# Patient Record
Sex: Male | Born: 1960 | Race: White | Hispanic: No | Marital: Married | Smoking: Never smoker
Health system: Southern US, Community
[De-identification: ages and names within clinical notes are randomized; demographics above are authoritative.]

## PROBLEM LIST (undated history)

## (undated) DIAGNOSIS — E7800 Pure hypercholesterolemia, unspecified: Secondary | ICD-10-CM

## (undated) DIAGNOSIS — K759 Inflammatory liver disease, unspecified: Secondary | ICD-10-CM

## (undated) DIAGNOSIS — C159 Malignant neoplasm of esophagus, unspecified: Secondary | ICD-10-CM

## (undated) DIAGNOSIS — R768 Other specified abnormal immunological findings in serum: Secondary | ICD-10-CM

## (undated) DIAGNOSIS — D126 Benign neoplasm of colon, unspecified: Secondary | ICD-10-CM

## (undated) DIAGNOSIS — G4730 Sleep apnea, unspecified: Secondary | ICD-10-CM

## (undated) DIAGNOSIS — I10 Essential (primary) hypertension: Secondary | ICD-10-CM

## (undated) DIAGNOSIS — E119 Type 2 diabetes mellitus without complications: Secondary | ICD-10-CM

## (undated) HISTORY — DX: Type 2 diabetes mellitus without complications: E11.9

## (undated) HISTORY — DX: Malignant neoplasm of esophagus, unspecified: C15.9

## (undated) HISTORY — PX: COLONOSCOPY: SHX174

## (undated) HISTORY — PX: CHOLECYSTECTOMY: SHX55

## (undated) HISTORY — DX: Essential (primary) hypertension: I10

---

## 2008-10-14 ENCOUNTER — Ambulatory Visit: Payer: Self-pay | Admitting: Pain Medicine

## 2010-02-20 ENCOUNTER — Ambulatory Visit: Payer: Self-pay | Admitting: Internal Medicine

## 2010-04-17 ENCOUNTER — Ambulatory Visit: Payer: Self-pay | Admitting: Gastroenterology

## 2010-04-27 ENCOUNTER — Inpatient Hospital Stay: Payer: Self-pay | Admitting: Surgery

## 2010-04-30 LAB — PATHOLOGY REPORT

## 2012-09-05 ENCOUNTER — Ambulatory Visit: Payer: Self-pay | Admitting: Unknown Physician Specialty

## 2013-07-16 ENCOUNTER — Ambulatory Visit: Payer: Self-pay | Admitting: Gastroenterology

## 2013-09-30 DIAGNOSIS — B1920 Unspecified viral hepatitis C without hepatic coma: Secondary | ICD-10-CM | POA: Insufficient documentation

## 2013-09-30 DIAGNOSIS — E119 Type 2 diabetes mellitus without complications: Secondary | ICD-10-CM | POA: Insufficient documentation

## 2013-09-30 DIAGNOSIS — I10 Essential (primary) hypertension: Secondary | ICD-10-CM | POA: Insufficient documentation

## 2013-09-30 DIAGNOSIS — G4733 Obstructive sleep apnea (adult) (pediatric): Secondary | ICD-10-CM | POA: Insufficient documentation

## 2014-04-17 ENCOUNTER — Ambulatory Visit: Payer: Self-pay | Admitting: Gastroenterology

## 2014-08-30 DIAGNOSIS — Z0000 Encounter for general adult medical examination without abnormal findings: Secondary | ICD-10-CM

## 2014-08-30 DIAGNOSIS — Z7189 Other specified counseling: Secondary | ICD-10-CM | POA: Insufficient documentation

## 2014-10-24 ENCOUNTER — Telehealth: Payer: Self-pay

## 2014-10-24 DIAGNOSIS — B182 Chronic viral hepatitis C: Secondary | ICD-10-CM

## 2014-10-24 NOTE — Telephone Encounter (Signed)
Tried contacting pt to send lab order for repeat labs. Mailed copy to pt.

## 2014-10-24 NOTE — Telephone Encounter (Signed)
-----   Message from Otilio Jefferson sent at 08/26/2014  2:10 PM EDT ----- Regarding: Re: Incoming Fax Sent: 10/07/2014  Phone Encounter: 04/12/2014  Recheck pt's viral load. GF  > From: Cas Tracz > To: Samira Acero > Sent: 04/23/2014 11:40 AM > Pt notified. GF  > From: Lucilla Lame MD > To: Journi Moffa > Sent: 04/22/2014 5:47 PM > Let the patient know the viral level was zero

## 2014-11-13 LAB — HEPATIC FUNCTION PANEL
ALK PHOS: 90 IU/L (ref 39–117)
ALT: 43 IU/L (ref 0–44)
AST: 30 IU/L (ref 0–40)
Albumin: 4.5 g/dL (ref 3.5–5.5)
Bilirubin Total: 0.8 mg/dL (ref 0.0–1.2)
Bilirubin, Direct: 0.2 mg/dL (ref 0.00–0.40)
TOTAL PROTEIN: 7.3 g/dL (ref 6.0–8.5)

## 2014-11-14 ENCOUNTER — Telehealth: Payer: Self-pay

## 2014-11-14 LAB — HCV RNA QUANT RFLX ULTRA OR GENOTYP: HCV QUANT BASELINE: NOT DETECTED [IU]/mL

## 2014-11-14 NOTE — Telephone Encounter (Signed)
-----   Message from Lucilla Lame, MD sent at 11/13/2014  8:36 AM EDT ----- Let the patient know the liver tests are normal.

## 2014-11-14 NOTE — Telephone Encounter (Signed)
Pt notified. Treatment completion has been over a year. Pt released from our care. Follow up PRN with PCP.

## 2014-11-14 NOTE — Telephone Encounter (Signed)
Pt notified of results. Treatment completion has been over a year. Pt released from our care. Follow up with PCP prn.

## 2014-12-26 ENCOUNTER — Ambulatory Visit: Payer: 59 | Attending: Pain Medicine | Admitting: Pain Medicine

## 2014-12-26 ENCOUNTER — Encounter: Payer: Self-pay | Admitting: Pain Medicine

## 2014-12-26 VITALS — BP 135/78 | HR 81 | Temp 98.5°F | Resp 16 | Ht 71.0 in | Wt 350.0 lb

## 2014-12-26 DIAGNOSIS — B1920 Unspecified viral hepatitis C without hepatic coma: Secondary | ICD-10-CM | POA: Insufficient documentation

## 2014-12-26 DIAGNOSIS — M1990 Unspecified osteoarthritis, unspecified site: Secondary | ICD-10-CM | POA: Insufficient documentation

## 2014-12-26 DIAGNOSIS — M2390 Unspecified internal derangement of unspecified knee: Secondary | ICD-10-CM | POA: Insufficient documentation

## 2014-12-26 DIAGNOSIS — M25561 Pain in right knee: Secondary | ICD-10-CM | POA: Diagnosis present

## 2014-12-26 DIAGNOSIS — M2391 Unspecified internal derangement of right knee: Secondary | ICD-10-CM

## 2014-12-26 DIAGNOSIS — G8929 Other chronic pain: Secondary | ICD-10-CM | POA: Diagnosis not present

## 2014-12-26 DIAGNOSIS — E119 Type 2 diabetes mellitus without complications: Secondary | ICD-10-CM | POA: Insufficient documentation

## 2014-12-26 DIAGNOSIS — M1711 Unilateral primary osteoarthritis, right knee: Secondary | ICD-10-CM

## 2014-12-26 DIAGNOSIS — E669 Obesity, unspecified: Secondary | ICD-10-CM | POA: Insufficient documentation

## 2014-12-26 DIAGNOSIS — M1710 Unilateral primary osteoarthritis, unspecified knee: Secondary | ICD-10-CM | POA: Insufficient documentation

## 2014-12-26 MED ORDER — METHYLPREDNISOLONE ACETATE 80 MG/ML IJ SUSP
INTRAMUSCULAR | Status: AC
  Filled 2014-12-26: qty 1

## 2014-12-26 MED ORDER — METHYLPREDNISOLONE ACETATE 80 MG/ML IJ SUSP
80.0000 mg | Freq: Once | INTRAMUSCULAR | Status: AC
  Administered 2014-12-26: 15:00:00 via INTRA_ARTICULAR

## 2014-12-26 MED ORDER — LIDOCAINE HCL (PF) 1 % IJ SOLN
10.0000 mL | Freq: Once | INTRAMUSCULAR | Status: AC
  Administered 2014-12-26: 15:00:00

## 2014-12-26 MED ORDER — ROPIVACAINE HCL 2 MG/ML IJ SOLN
INTRAMUSCULAR | Status: AC
  Filled 2014-12-26: qty 10

## 2014-12-26 MED ORDER — LIDOCAINE HCL (PF) 1 % IJ SOLN
INTRAMUSCULAR | Status: AC
  Filled 2014-12-26: qty 5

## 2014-12-26 MED ORDER — ROPIVACAINE HCL 2 MG/ML IJ SOLN
9.0000 mL | Freq: Once | INTRAMUSCULAR | Status: AC
  Administered 2014-12-26: 15:00:00

## 2014-12-26 NOTE — Progress Notes (Signed)
Safety precautions to be maintained throughout the outpatient stay will include: orient to surroundings, keep bed in low position, maintain call bell within reach at all times, provide assistance with transfer out of bed and ambulation.  

## 2014-12-26 NOTE — Progress Notes (Signed)
Patient's Name: Matthew Yang MRN: [id number] DOB: Apr 16, 1960 DOS: 12/26/2014  Primary Reason(s) for Visit: Interventional Pain Management Treatment. CC: Knee Pain I did  Pre-Procedure Assessment: Matthew Yang is a 54 y.o. year old, male patient, seen today for interventional treatment. He has Type 2 diabetes mellitus ([place]); HCV (hepatitis C virus); Benign hypertension; Morbid (severe) obesity due to excess calories ([place]); Obstructive apnea; Encounter for general adult medical examination without abnormal findings; Tricompartment osteoarthritis of knee; Arthritis, senescent; Right knee pain; and Knee derangement syndrome on his problem list.. His primarily concern today is the Knee Pain Verification of the correct person, correct site (including marking of site), and correct procedure were performed and confirmed by the patient. Physical exam performed prior to the procedure revealed exquisite tenderness to palpation over the medial aspect of the knee in the area of the medial meniscus and the medial collateral ligament. The patient indicates triggering of the pain upon exercising pressure over the medial compartment during normal walking.  Today's Vitals   12/26/14 1433 12/26/14 1436 12/26/14 1500  BP: 121/69  135/78  Pulse: 82  81  Temp: 98.5 F (36.9 C)    Resp: 20  16  Height: '5\' 11"'  (1.803 m)    Weight: 350 lb (158.759 kg)    SpO2:   96%  PainSc: 8  8  0-No pain  PainLoc: Knee    Calculated BMI: Body mass index is 48.84 kg/(m^2). Allergies: He has No Known Allergies.. Primary Diagnosis: Right knee pain [M25.561]  Procedure: Type: Diagnostic Intra-Articular Knee Injection Region: Medial  Knee Region Level: Knee Joint Laterality: Right  Indications: Chronic Pain Knee Joint Pain  Consent: Secured. Under the influence of no sedatives a written informed consent was obtained, after having provided information on the risks and possible complications. To fulfill our ethical and legal  obligations, as recommended by the American Medical Association's Code of Ethics, we have provided information to the patient about our clinical impression; the nature and purpose of the treatment or procedure; the risks, benefits, and possible complications of the intervention; alternatives; the risk(s) and benefit(s) of the alternative treatment(s) or procedure(s); and the risk(s) and benefit(s) of doing nothing. The patient was provided information about the risks and possible complications associated with the procedure. These include, but are not limited to, failure to achieve desired goals, infection, bleeding, organ or nerve damage, allergic reactions, paralysis, and death. In the case of intra- or periarticular procedures these may include, but are not limited to, failure to achieve desired goals, infection, bleeding (hemarthrosis), organ or nerve damage, allergic reactions, and death. In addition, the patient was informed that Medicine is not an exact science; therefore, there is also the possibility of unforeseen risks and possible complications that may result in a catastrophic outcome. The patient indicated having understood very clearly. We have given the patient no guarantees and we have made no promises. Enough time was given to the patient to ask questions, all of which were answered to the patient's satisfaction.  Pre-Procedure Preparation: Safety Precautions: Allergies reviewed. Appropriate site, procedure, and patient were confirmed by following the Joint Commission's Universal Protocol (UP.01.01.01), in the form of a "Time Out". The patient was asked to confirm marked site and procedure, before commencing. The patient was asked about blood thinners, or active infections, both of which were denied. Patient was assessed for positional comfort and all pressure points were checked before starting procedure. Monitoring:  As per clinic protocol. Infection Control Precautions: Sterile technique  used. Standard Universal Precautions  were taken as recommended by the Department of [facility] for Disease Control and Prevention (CDC). Standard pre-surgical skin prep was conducted. Respiratory hygiene and cough etiquette was practiced. Hand hygiene observed. Safe injection practices and needle disposal techniques followed. SDV (single dose vial) medications used. Medications properly checked for expiration dates and contaminants. Personal protective equipment (PPE) used: Sterile surgical gloves.  Anesthesia, Analgesia, Anxiolysis: Type: Local Anesthesia Local Anesthetic(s): Lidocaine 1% Route: Subcutaneous IV Access: None. Sedation: Declined. Indication(s): Patient declined.  Description of Procedure Process:  Time-out: "Time-out" completed before starting procedure, as per protocol. Position: Sitting with the right knee bent 90 degrees over the fluoroscopy table, with the medial aspect facing up. Target Area: Knee Joint Approach: Medial approach. Area Prepped: Entire knee area, from the mid-thigh to the mid-shin. Prepping solution: ChloraPrep (2% chlorhexidine gluconate and 70% isopropyl alcohol) Safety Precautions: Aspiration looking for blood return was conducted prior to all injections. At no point did we inject any substances, as a needle was being advanced. No attempts were made at seeking any paresthesias. Safe injection practices and needle disposal techniques used. Medications properly checked for expiration dates. SDV (single dose vial) medications used. Description of the Procedure: Protocol guidelines were followed. The patient was placed in position over the fluoroscopy table. The target area was identified and the area prepped in the usual manner. Skin desensitized using vapocoolant spray. Skin & deeper tissues infiltrated with local anesthetic. Appropriate amount of time allowed to pass for local anesthetics to take effect. The procedure needles were then advanced to  the target area. Proper needle placement secured. Negative aspiration confirmed. Solution injected in intermittent fashion, asking for systemic symptoms every 0.5cc of injectate. The needles were then removed and the area cleansed, making sure to leave some of the prepping solution back to take advantage of its long term bactericidal properties. EBL: None Materials & Medications Used:  Needle(s) Used: 22g - 1.5" Needle(s) Medications Administered today: We administered methylPREDNISolone acetate, lidocaine (PF), ropivacaine (PF) 2 mg/ml (0.2%), ropivacaine (PF) 2 mg/ml (0.2%), lidocaine (PF), and methylPREDNISolone acetate.Please see chart orders for dosing details.  Imaging Guidance:  Type of Imaging Technique: Fluoroscopy Guidance (Non-spinal) Indication(s): Assistance in needle guidance and placement for procedures requiring needle placement in or near specific anatomical locations not easily accessible without such assistance. Exposure Time: Please see nurses notes. Contrast: None required. Fluoroscopic Guidance: I was personally present in the fluoroscopy suite, where the patient was placed in position for the procedure, over the fluoroscopy-compatible table. Fluoroscopy was manipulated, using "Tunnel Vision Technique", to obtain the best possible view of the target area, on the affected side. Parallax error was corrected before commencing the procedure. A "direction-depth-direction" technique was used to introduce the needle under continuous pulsed fluoroscopic guidance. Interpretation: Intraoperative imaging interpretation by performing Physician. Adequate needle placement confirmed. No contrast injected. Permanent hardcopy images in multiple planes scanned into the patient's record.  Antibiotics:  Type:  Antibiotics Given (last 72 hours)    None      Indication(s): No indications identified.  Post-operative Assessment:  Complications: No immediate post-treatment complications were  observed. Disposition: Return to clinic for follow-up evaluation. The patient tolerated the entire procedure well. A repeat set of vitals were taken after the procedure and the patient was kept under observation following institutional policy, for this type of procedure. The patient was discharged home, once institutional criteria were met. The patient was provided with post-procedure discharge instructions, including a section on how to identify potential problems. Should any problems  arise concerning this procedure, the patient was given instructions to immediately contact us, at any time, without hesitation. In any case, we plan to contact the patient by telephone for a follow-up status report regarding this interventional procedure. Comments:  No additional relevant information.  Primary Care Physician: Kirk Ruths., MD Location: [facility] Note by: Kathlen Brunswick. Dossie Arbour, M.D, DABA, DABAPM, DABPM, DABIPP, FIPP  Disclaimer:  Medicine is not an exact science. The only guarantee in medicine is that nothing is guaranteed. It is important to note that the decision to proceed with this intervention was based on the information collected from the patient. The Data and conclusions were drawn from the patient's questionnaire, the interview, and the physical examination. Because the information was provided in large part by the patient, it cannot be guaranteed that it has not been purposely or unconsciously manipulated. Every effort has been made to obtain as much relevant data as possible for this evaluation. It is important to note that the conclusions that lead to this procedure are derived in large part from the available data. Always take into account that the treatment will also be dependent on availability of resources and existing treatment guidelines, considered by other Pain Management Practitioners as being common knowledge and practice, at the time of the  intervention. For Medico-Legal purposes, it is also important to point out that variation in procedural techniques and pharmacological choices are the acceptable norm. The indications, contraindications, technique, and results of the above procedure should only be interpreted and judged by a Board-Certified Interventional Pain Specialist with extensive familiarity and expertise in the same exact procedure and technique. Attempts at providing opinions without similar or greater experience and expertise than that of the treating physician will be considered as inappropriate and unethical, and shall result in a formal complaint to the state medical board and applicable specialty societies. Daisyreviewed the middle ear

## 2014-12-26 NOTE — Assessment & Plan Note (Signed)
Today, 12/26/2014, we performed a diagnostic right knee injection. Physical exam would suggest medial meniscal tear. MRI ordered.

## 2014-12-26 NOTE — Patient Instructions (Signed)
Knee Injection A knee injection is a procedure to get medicine into your knee joint. Your health care provider puts a needle into the joint and injects medicine with an attached syringe. The injected medicine may relieve the pain, swelling, and stiffness of arthritis. The injected medicine may also help to lubricate and cushion your knee joint. You may need more than one injection. LET [facility] CARE PROVIDER KNOW ABOUT:  Any allergies you have.  All medicines you are taking, including vitamins, herbs, eye drops, creams, and over-the-counter medicines.  Previous problems you or members of your family have had with the use of anesthetics.  Any blood disorders you have.  Previous surgeries you have had.  Any medical conditions you may have. RISKS AND COMPLICATIONS Generally, this is a safe procedure. However, problems may occur, including:  Infection.  Bleeding.  Worsening symptoms.  Damage to the area around your knee.  Allergic reaction to any of the medicines.  Skin reactions from repeated injections. BEFORE THE PROCEDURE  Ask your health care provider about changing or stopping your regular medicines. This is especially important if you are taking diabetes medicines or blood thinners.  Plan to have someone take you home after the procedure. PROCEDURE  You will sit or lie down in a position for your knee to be treated.  The skin over your kneecap will be cleaned with a germ-killing solution (antiseptic).  You will be given a medicine that numbs the area (local anesthetic). You may feel some stinging.  After your knee becomes numb, you will have a second injection. This is the medicine. This needle is carefully placed between your kneecap and your knee. The medicine is injected into the joint space.  At the end of the procedure, the needle will be removed.  A bandage (dressing) may be placed over the injection site. The procedure may vary among health care providers  and hospitals. AFTER THE PROCEDURE  You may have to move your knee through its full range of motion. This helps to get all of the medicine into your joint space.  Your blood pressure, heart rate, breathing rate, and blood oxygen level will be monitored often until the medicines you were given have worn off.  You will be watched to make sure that you do not have a reaction to the injected medicine.   This information is not intended to replace advice given to you by your health care provider. Make sure you discuss any questions you have with your health care provider.   Document Released: 05/16/2006 Document Revised: 03/15/2014 Document Reviewed: 01/02/2014 Elsevier Interactive Patient Education 2016 [employer] Pain Management Discharge Instructions  General Discharge Instructions :  If you need to reach your doctor call: Monday-Friday 8:00 am - 4:00 pm at [phone] or toll free [phone].  After clinic hours [phone] to have operator reach doctor.  Bring all of your medication bottles to all your appointments in the pain clinic.  To cancel or reschedule your appointment with Pain Management please remember to call 24 hours in advance to avoid a fee.  Refer to the educational materials which you have been given on: General Risks, I had my Procedure. Discharge Instructions, Post Sedation.  Post Procedure Instructions:  The drugs you were given will stay in your system until tomorrow, so for the next 24 hours you should not drive, make any legal decisions or drink any alcoholic beverages.  You may eat anything you prefer, but it is better to start with liquids then  soups and crackers, and gradually work up to solid foods.  Please notify your doctor immediately if you have any unusual bleeding, trouble breathing or pain that is not related to your normal pain.  Depending on the type of procedure that was done, some parts of your body may feel week and/or numb.  This  usually clears up by tonight or the next day.  Walk with the use of an assistive device or accompanied by an adult for the 24 hours.  You may use ice on the affected area for the first 24 hours.  Put ice in a Ziploc bag and cover with a towel and place against area 15 minutes on 15 minutes off.  You may switch to heat after 24 hours.

## 2014-12-27 ENCOUNTER — Telehealth: Payer: Self-pay | Admitting: *Deleted

## 2014-12-27 NOTE — Telephone Encounter (Signed)
Left voice mail

## 2015-03-17 DIAGNOSIS — G4733 Obstructive sleep apnea (adult) (pediatric): Secondary | ICD-10-CM | POA: Diagnosis not present

## 2015-03-19 DIAGNOSIS — R739 Hyperglycemia, unspecified: Secondary | ICD-10-CM | POA: Diagnosis not present

## 2015-03-19 DIAGNOSIS — E782 Mixed hyperlipidemia: Secondary | ICD-10-CM | POA: Diagnosis not present

## 2015-03-19 DIAGNOSIS — E119 Type 2 diabetes mellitus without complications: Secondary | ICD-10-CM | POA: Diagnosis not present

## 2015-03-24 DIAGNOSIS — G4733 Obstructive sleep apnea (adult) (pediatric): Secondary | ICD-10-CM | POA: Diagnosis not present

## 2015-03-24 DIAGNOSIS — I10 Essential (primary) hypertension: Secondary | ICD-10-CM | POA: Diagnosis not present

## 2015-03-28 DIAGNOSIS — E119 Type 2 diabetes mellitus without complications: Secondary | ICD-10-CM | POA: Diagnosis not present

## 2015-03-28 DIAGNOSIS — G4733 Obstructive sleep apnea (adult) (pediatric): Secondary | ICD-10-CM | POA: Diagnosis not present

## 2015-03-28 DIAGNOSIS — I10 Essential (primary) hypertension: Secondary | ICD-10-CM | POA: Diagnosis not present

## 2015-04-24 DIAGNOSIS — I10 Essential (primary) hypertension: Secondary | ICD-10-CM | POA: Diagnosis not present

## 2015-04-24 DIAGNOSIS — G4733 Obstructive sleep apnea (adult) (pediatric): Secondary | ICD-10-CM | POA: Diagnosis not present

## 2015-05-22 DIAGNOSIS — G4733 Obstructive sleep apnea (adult) (pediatric): Secondary | ICD-10-CM | POA: Diagnosis not present

## 2015-05-22 DIAGNOSIS — I10 Essential (primary) hypertension: Secondary | ICD-10-CM | POA: Diagnosis not present

## 2015-07-22 DIAGNOSIS — E119 Type 2 diabetes mellitus without complications: Secondary | ICD-10-CM | POA: Diagnosis not present

## 2015-07-22 DIAGNOSIS — I10 Essential (primary) hypertension: Secondary | ICD-10-CM | POA: Diagnosis not present

## 2015-07-22 DIAGNOSIS — E7800 Pure hypercholesterolemia, unspecified: Secondary | ICD-10-CM | POA: Diagnosis not present

## 2015-07-25 DIAGNOSIS — I10 Essential (primary) hypertension: Secondary | ICD-10-CM | POA: Diagnosis not present

## 2015-07-25 DIAGNOSIS — E119 Type 2 diabetes mellitus without complications: Secondary | ICD-10-CM | POA: Diagnosis not present

## 2015-07-25 DIAGNOSIS — G4733 Obstructive sleep apnea (adult) (pediatric): Secondary | ICD-10-CM | POA: Diagnosis not present

## 2015-08-15 DIAGNOSIS — M1711 Unilateral primary osteoarthritis, right knee: Secondary | ICD-10-CM | POA: Diagnosis not present

## 2015-11-13 DIAGNOSIS — E119 Type 2 diabetes mellitus without complications: Secondary | ICD-10-CM | POA: Diagnosis not present

## 2015-11-13 DIAGNOSIS — Z0000 Encounter for general adult medical examination without abnormal findings: Secondary | ICD-10-CM | POA: Diagnosis not present

## 2015-11-13 DIAGNOSIS — I10 Essential (primary) hypertension: Secondary | ICD-10-CM | POA: Diagnosis not present

## 2015-11-14 DIAGNOSIS — E7800 Pure hypercholesterolemia, unspecified: Secondary | ICD-10-CM | POA: Diagnosis not present

## 2015-11-14 DIAGNOSIS — I10 Essential (primary) hypertension: Secondary | ICD-10-CM | POA: Diagnosis not present

## 2015-11-14 DIAGNOSIS — E119 Type 2 diabetes mellitus without complications: Secondary | ICD-10-CM | POA: Diagnosis not present

## 2015-12-09 DIAGNOSIS — G4733 Obstructive sleep apnea (adult) (pediatric): Secondary | ICD-10-CM | POA: Diagnosis not present

## 2016-01-02 ENCOUNTER — Other Ambulatory Visit: Payer: Self-pay | Admitting: Internal Medicine

## 2016-01-02 DIAGNOSIS — R319 Hematuria, unspecified: Secondary | ICD-10-CM | POA: Diagnosis not present

## 2016-01-02 DIAGNOSIS — Z23 Encounter for immunization: Secondary | ICD-10-CM | POA: Diagnosis not present

## 2016-01-09 ENCOUNTER — Ambulatory Visit: Admission: RE | Admit: 2016-01-09 | Payer: 59 | Source: Ambulatory Visit

## 2016-01-16 DIAGNOSIS — Z86010 Personal history of colonic polyps: Secondary | ICD-10-CM | POA: Diagnosis not present

## 2016-02-19 ENCOUNTER — Other Ambulatory Visit: Payer: Self-pay

## 2016-04-07 DIAGNOSIS — J209 Acute bronchitis, unspecified: Secondary | ICD-10-CM | POA: Diagnosis not present

## 2016-05-04 ENCOUNTER — Ambulatory Visit: Payer: 59 | Admitting: Anesthesiology

## 2016-05-04 ENCOUNTER — Encounter
Admission: RE | Disposition: A | Discharge status: Home / Self Care | Payer: Self-pay | Source: Ambulatory Visit | Attending: Gastroenterology

## 2016-05-04 ENCOUNTER — Ambulatory Visit
Admission: RE | Admit: 2016-05-04 | Discharge: 2016-05-04 | Disposition: A | Discharge status: Home / Self Care | Payer: 59 | Source: Ambulatory Visit | Attending: Gastroenterology | Admitting: Gastroenterology

## 2016-05-04 DIAGNOSIS — M1990 Unspecified osteoarthritis, unspecified site: Secondary | ICD-10-CM | POA: Insufficient documentation

## 2016-05-04 DIAGNOSIS — D125 Benign neoplasm of sigmoid colon: Secondary | ICD-10-CM | POA: Insufficient documentation

## 2016-05-04 DIAGNOSIS — Z7984 Long term (current) use of oral hypoglycemic drugs: Secondary | ICD-10-CM | POA: Insufficient documentation

## 2016-05-04 DIAGNOSIS — G4730 Sleep apnea, unspecified: Secondary | ICD-10-CM | POA: Insufficient documentation

## 2016-05-04 DIAGNOSIS — Z1211 Encounter for screening for malignant neoplasm of colon: Secondary | ICD-10-CM | POA: Insufficient documentation

## 2016-05-04 DIAGNOSIS — K529 Noninfective gastroenteritis and colitis, unspecified: Secondary | ICD-10-CM | POA: Insufficient documentation

## 2016-05-04 DIAGNOSIS — D121 Benign neoplasm of appendix: Secondary | ICD-10-CM | POA: Diagnosis not present

## 2016-05-04 DIAGNOSIS — K5289 Other specified noninfective gastroenteritis and colitis: Secondary | ICD-10-CM | POA: Diagnosis not present

## 2016-05-04 DIAGNOSIS — E119 Type 2 diabetes mellitus without complications: Secondary | ICD-10-CM | POA: Insufficient documentation

## 2016-05-04 DIAGNOSIS — Z86010 Personal history of colonic polyps: Secondary | ICD-10-CM | POA: Diagnosis not present

## 2016-05-04 DIAGNOSIS — I10 Essential (primary) hypertension: Secondary | ICD-10-CM | POA: Diagnosis not present

## 2016-05-04 DIAGNOSIS — D120 Benign neoplasm of cecum: Secondary | ICD-10-CM | POA: Diagnosis not present

## 2016-05-04 DIAGNOSIS — Z79899 Other long term (current) drug therapy: Secondary | ICD-10-CM | POA: Insufficient documentation

## 2016-05-04 DIAGNOSIS — E7800 Pure hypercholesterolemia, unspecified: Secondary | ICD-10-CM | POA: Diagnosis not present

## 2016-05-04 HISTORY — DX: Sleep apnea, unspecified: G47.30

## 2016-05-04 HISTORY — DX: Inflammatory liver disease, unspecified: K75.9

## 2016-05-04 HISTORY — PX: COLONOSCOPY WITH PROPOFOL: SHX5780

## 2016-05-04 HISTORY — DX: Pure hypercholesterolemia, unspecified: E78.00

## 2016-05-04 HISTORY — DX: Morbid (severe) obesity due to excess calories: E66.01

## 2016-05-04 LAB — GLUCOSE, CAPILLARY: GLUCOSE-CAPILLARY: 130 mg/dL — AB (ref 65–99)

## 2016-05-04 SURGERY — COLONOSCOPY WITH PROPOFOL
Anesthesia: General

## 2016-05-04 MED ORDER — PROPOFOL 500 MG/50ML IV EMUL
INTRAVENOUS | Status: DC | PRN
  Administered 2016-05-04: 160 ug/kg/min via INTRAVENOUS

## 2016-05-04 MED ORDER — LIDOCAINE 2% (20 MG/ML) 5 ML SYRINGE
INTRAMUSCULAR | Status: DC | PRN
  Administered 2016-05-04: 40 mg via INTRAVENOUS

## 2016-05-04 MED ORDER — MIDAZOLAM HCL 5 MG/5ML IJ SOLN
INTRAMUSCULAR | Status: DC | PRN
  Administered 2016-05-04: 1 mg via INTRAVENOUS

## 2016-05-04 MED ORDER — PROPOFOL 10 MG/ML IV BOLUS
INTRAVENOUS | Status: AC
  Filled 2016-05-04: qty 20

## 2016-05-04 MED ORDER — SODIUM CHLORIDE 0.9 % IJ SOLN
INTRAMUSCULAR | Status: AC
  Filled 2016-05-04: qty 10

## 2016-05-04 MED ORDER — SODIUM CHLORIDE 0.9 % IV SOLN
INTRAVENOUS | Status: DC
  Administered 2016-05-04: 07:00:00 via INTRAVENOUS

## 2016-05-04 MED ORDER — MIDAZOLAM HCL 2 MG/2ML IJ SOLN
INTRAMUSCULAR | Status: AC
  Filled 2016-05-04: qty 2

## 2016-05-04 MED ORDER — FENTANYL CITRATE (PF) 100 MCG/2ML IJ SOLN
INTRAMUSCULAR | Status: DC | PRN
  Administered 2016-05-04: 50 ug via INTRAVENOUS

## 2016-05-04 MED ORDER — LIDOCAINE HCL (PF) 2 % IJ SOLN
INTRAMUSCULAR | Status: AC
  Filled 2016-05-04: qty 2

## 2016-05-04 MED ORDER — EPHEDRINE SULFATE 50 MG/ML IJ SOLN
INTRAMUSCULAR | Status: DC | PRN
  Administered 2016-05-04: 10 mg via INTRAVENOUS

## 2016-05-04 MED ORDER — PROPOFOL 10 MG/ML IV BOLUS
INTRAVENOUS | Status: DC | PRN
  Administered 2016-05-04: 100 mg via INTRAVENOUS

## 2016-05-04 MED ORDER — FENTANYL CITRATE (PF) 100 MCG/2ML IJ SOLN
INTRAMUSCULAR | Status: AC
  Filled 2016-05-04: qty 2

## 2016-05-04 MED ORDER — PHENYLEPHRINE HCL 10 MG/ML IJ SOLN
INTRAMUSCULAR | Status: DC | PRN
  Administered 2016-05-04 (×2): 100 ug via INTRAVENOUS

## 2016-05-04 MED ORDER — SODIUM CHLORIDE 0.9 % IV SOLN: INTRAVENOUS | Status: DC

## 2016-05-04 MED ORDER — PROPOFOL 500 MG/50ML IV EMUL
INTRAVENOUS | Status: AC
  Filled 2016-05-04: qty 50

## 2016-05-04 NOTE — Anesthesia Post-op Follow-up Note (Cosign Needed)
Anesthesia QCDR form completed.        

## 2016-05-04 NOTE — H&P (Signed)
Outpatient short stay form Pre-procedure 05/04/2016 7:25 AM Lollie Sails MD  Primary Physician: Dr. Frazier Richards  Reason for visit:  Colonoscopy  History of present illness:  Patient is a 56 year old male presenting today as above. Is a personal history of adenomatous colon polyps. He tolerated his prep well. He takes no aspirin or blood thinning agents.    Current Facility-Administered Medications:  .  0.9 %  sodium chloride infusion, , Intravenous, Continuous, Lollie Sails, MD, Last Rate: 20 mL/hr at 05/04/16 0723 .  0.9 %  sodium chloride infusion, , Intravenous, Continuous, Lollie Sails, MD  Prescriptions Prior to Admission  Medication Sig Dispense Refill Last Dose  . atorvastatin (LIPITOR) 20 MG tablet Take 20 mg by mouth daily.   05/04/2016 at Unknown time  . lisinopril (PRINIVIL,ZESTRIL) 20 MG tablet Take 20 mg by mouth daily.   05/04/2016 at Unknown time  . Omega-3 Fatty Acids (FISH OIL) 1000 MG CAPS Take by mouth.   05/02/2016  . glipiZIDE (GLUCOTROL XL) 2.5 MG 24 hr tablet Take 2.5 mg by mouth daily with breakfast.   05/02/2016  . Multiple Vitamin (MULTI-VITAMINS) TABS Take by mouth.   05/02/2016     Allergies  Allergen Reactions  . Metformin And Related Nausea Only     Past Medical History:  Diagnosis Date  . Diabetes mellitus without complication ([place])   . Hepatitis    HEPATITIS C  . Hypercholesteremia   . Hypertension   . Morbid obesity ([place])   . Sleep apnea     Review of systems:      Physical Exam    Heart and lungs: Regular rate and rhythm without rub or gallop, lungs are bilaterally clear.    HEENT: Normocephalic atraumatic eyes are anicteric    Other:     Pertinant exam for procedure: Soft nontender nondistended bowel sounds positive and normoactive.    Planned proceedures: Colonoscopy and indicated procedures. Outpatient short stay form Pre-procedure 05/04/2016 7:26 AM  I have discussed the risks benefits and complications  of procedures to include not limited to bleeding, infection, perforation and the risk of sedation and the patient wishes to proceed.

## 2016-05-04 NOTE — Op Note (Signed)
[facility] Gastroenterology Patient Name: Matthew Yang Procedure Date: 05/04/2016 7:31 AM MRN: [id number] Account #: [id number] Date of Birth: May 06, 1960 Admit Type: Outpatient Age: 56 Room: [facility] ENDO ROOM 3 Gender: Male Note Status: Finalized Procedure:            Colonoscopy Indications:          Personal history of colonic polyps Providers:            Lollie Sails, MD Referring MD:         Ocie Cornfield. Ouida Sills MD, MD (Referring MD) Medicines:            Monitored Anesthesia Care Complications:        No immediate complications. Procedure:            Pre-Anesthesia Assessment:                       - ASA Grade Assessment: III - A patient with severe                        systemic disease.                       After obtaining informed consent, the colonoscope was                        passed under direct vision. Throughout the procedure,                        the patient's blood pressure, pulse, and oxygen                        saturations were monitored continuously. The                        Colonoscope was introduced through the anus and                        advanced to the the cecum, identified by appendiceal                        orifice and ileocecal valve. The patient tolerated the                        procedure well. The quality of the bowel preparation                        was good. Findings:      A 2 mm polyp was found in the distal sigmoid colon. The polyp was       sessile. The polyp was removed with a cold biopsy forceps. Resection and       retrieval were complete.      A less than 1 mm polyp was found near the appendiceal orifice. The polyp       was sessile. The polyp was removed with a cold biopsy forceps. Resection       and retrieval were complete.      A localized area of granular mucosa was found at the ileocecal valve.       Biopsies were taken with a cold forceps for histology.      The digital rectal exam was  normal.  The retroflexed view of the distal rectum and anal verge was normal and       showed no anal or rectal abnormalities. Impression:           - One 2 mm polyp in the distal sigmoid colon, removed                        with a cold biopsy forceps. Resected and retrieved.                       - One less than 1 mm polyp near the appendiceal                        orifice, removed with a cold biopsy forceps. Resected                        and retrieved.                       - Granularity at the ileocecal valve. Biopsied.                       - The distal rectum and anal verge are normal on                        retroflexion view. Recommendation:       - Discharge patient to home.                       - Await pathology results.                       - Telephone GI clinic for pathology results in 1 week. Procedure Code(s):    --- Professional ---                       [phone], Colonoscopy, flexible; with biopsy, single or                        multiple Diagnosis Code(s):    --- Professional ---                       D12.5, Benign neoplasm of sigmoid colon                       D12.0, Benign neoplasm of cecum                       K63.89, Other specified diseases of intestine                       Z86.010, Personal history of colonic polyps CPT copyright 2016 American Medical Association. All rights reserved. The codes documented in this report are preliminary and upon coder review may  be revised to meet current compliance requirements. Lollie Sails, MD 05/04/2016 8:08:16 AM This report has been signed electronically. Number of Addenda: 0 Note Initiated On: 05/04/2016 7:31 AM Scope Withdrawal Time: 0 hours 12 minutes 1 second  Total Procedure Duration: 0 hours 21 minutes 50 seconds       [facility]

## 2016-05-04 NOTE — Anesthesia Postprocedure Evaluation (Signed)
Anesthesia Post Note  Patient: Matthew Yang  Procedure(s) Performed: Procedure(s) (LRB): COLONOSCOPY WITH PROPOFOL (N/A)  Patient location during evaluation: PACU Anesthesia Type: General Level of consciousness: awake and alert Pain management: pain level controlled Vital Signs Assessment: post-procedure vital signs reviewed and stable Respiratory status: spontaneous breathing, nonlabored ventilation, respiratory function stable and patient connected to nasal cannula oxygen Cardiovascular status: blood pressure returned to baseline and stable Postop Assessment: no signs of nausea or vomiting Anesthetic complications: no     Last Vitals:  Vitals:   05/04/16 0811 05/04/16 0817  BP: (!) 90/55 113/64  Pulse: 82 65  Resp: 16 (!) 23  Temp: 37.3 C     Last Pain:  Vitals:   05/04/16 0811  TempSrc: Tympanic                 Molli Barrows

## 2016-05-04 NOTE — Transfer of Care (Signed)
Immediate Anesthesia Transfer of Care Note  Patient: Matthew Yang  Procedure(s) Performed: Procedure(s): COLONOSCOPY WITH PROPOFOL (N/A)  Patient Location: PACU and Endoscopy Unit  Anesthesia Type:General  Level of Consciousness: awake, oriented and patient cooperative  Airway & Oxygen Therapy: Patient Spontanous Breathing and Patient connected to nasal cannula oxygen  Post-op Assessment: Report given to RN and Post -op Vital signs reviewed and stable  Post vital signs: Reviewed and stable  Last Vitals:  Vitals:   05/04/16 0707 05/04/16 0807  BP: (!) 156/71 (!) 89/55  Pulse: 73 73  Resp: 16 14  Temp: (!) 35.9 C 37.3 C    Last Pain:  Vitals:   05/04/16 0807  TempSrc: Tympanic         Complications: No apparent anesthesia complications

## 2016-05-04 NOTE — Anesthesia Preprocedure Evaluation (Signed)
Anesthesia Evaluation  Patient identified by MRN, date of birth, ID band Patient awake    Reviewed: Allergy & Precautions, H&P , NPO status , Patient's Chart, lab work & pertinent test results, reviewed documented beta blocker date and time   Airway Mallampati: III   Neck ROM: full    Dental  (+) Poor Dentition, Teeth Intact   Pulmonary neg pulmonary ROS, sleep apnea ,    Pulmonary exam normal        Cardiovascular hypertension, negative cardio ROS Normal cardiovascular exam Rhythm:regular Rate:Normal     Neuro/Psych negative neurological ROS  negative psych ROS   GI/Hepatic negative GI ROS, Neg liver ROS, (+) Hepatitis -  Endo/Other  negative endocrine ROSdiabetes, Well Controlled, Type 2, Oral Hypoglycemic Agents  Renal/GU negative Renal ROS  negative genitourinary   Musculoskeletal  (+) Arthritis ,   Abdominal   Peds  Hematology negative hematology ROS (+)   Anesthesia Other Findings Past Medical History: No date: Diabetes mellitus without complication (HCC) No date: Hepatitis     Comment: HEPATITIS C No date: Hypercholesteremia No date: Hypertension No date: Morbid obesity ([place]) No date: Sleep apnea Past Surgical History: No date: CHOLECYSTECTOMY No date: COLONOSCOPY BMI    Body Mass Index:  48.82 kg/m     Reproductive/Obstetrics negative OB ROS                             Anesthesia Physical Anesthesia Plan  ASA: III  Anesthesia Plan: General   Post-op Pain Management:    Induction:   Airway Management Planned:   Additional Equipment:   Intra-op Plan:   Post-operative Plan:   Informed Consent: I have reviewed the patients History and Physical, chart, labs and discussed the procedure including the risks, benefits and alternatives for the proposed anesthesia with the patient or authorized representative who has indicated his/her understanding and acceptance.    Dental Advisory Given  Plan Discussed with: CRNA  Anesthesia Plan Comments:         Anesthesia Quick Evaluation

## 2016-05-05 ENCOUNTER — Encounter: Payer: Self-pay | Admitting: Gastroenterology

## 2016-05-05 LAB — SURGICAL PATHOLOGY

## 2016-05-06 DIAGNOSIS — G4733 Obstructive sleep apnea (adult) (pediatric): Secondary | ICD-10-CM | POA: Diagnosis not present

## 2016-05-06 DIAGNOSIS — E119 Type 2 diabetes mellitus without complications: Secondary | ICD-10-CM | POA: Diagnosis not present

## 2016-05-06 DIAGNOSIS — I10 Essential (primary) hypertension: Secondary | ICD-10-CM | POA: Diagnosis not present

## 2016-10-21 DIAGNOSIS — L03011 Cellulitis of right finger: Secondary | ICD-10-CM | POA: Diagnosis not present

## 2016-10-21 DIAGNOSIS — H6121 Impacted cerumen, right ear: Secondary | ICD-10-CM | POA: Diagnosis not present

## 2016-11-03 DIAGNOSIS — E119 Type 2 diabetes mellitus without complications: Secondary | ICD-10-CM | POA: Diagnosis not present

## 2016-11-03 DIAGNOSIS — I10 Essential (primary) hypertension: Secondary | ICD-10-CM | POA: Diagnosis not present

## 2016-11-03 DIAGNOSIS — E7800 Pure hypercholesterolemia, unspecified: Secondary | ICD-10-CM | POA: Diagnosis not present

## 2016-11-10 DIAGNOSIS — I10 Essential (primary) hypertension: Secondary | ICD-10-CM | POA: Diagnosis not present

## 2016-11-10 DIAGNOSIS — Z0000 Encounter for general adult medical examination without abnormal findings: Secondary | ICD-10-CM | POA: Diagnosis not present

## 2016-11-10 DIAGNOSIS — E119 Type 2 diabetes mellitus without complications: Secondary | ICD-10-CM | POA: Diagnosis not present

## 2018-07-20 ENCOUNTER — Other Ambulatory Visit: Payer: Self-pay | Admitting: Gastroenterology

## 2018-07-20 DIAGNOSIS — R1310 Dysphagia, unspecified: Secondary | ICD-10-CM

## 2018-07-27 ENCOUNTER — Ambulatory Visit
Admission: RE | Admit: 2018-07-27 | Discharge: 2018-07-27 | Disposition: A | Discharge status: Home / Self Care | Payer: No Typology Code available for payment source | Source: Ambulatory Visit | Attending: Gastroenterology | Admitting: Gastroenterology

## 2018-07-27 ENCOUNTER — Other Ambulatory Visit: Payer: Self-pay

## 2018-07-27 DIAGNOSIS — R1310 Dysphagia, unspecified: Secondary | ICD-10-CM | POA: Insufficient documentation

## 2018-08-01 ENCOUNTER — Other Ambulatory Visit: Payer: Self-pay

## 2018-08-01 ENCOUNTER — Other Ambulatory Visit
Admission: RE | Admit: 2018-08-01 | Discharge: 2018-08-01 | Disposition: A | Discharge status: Home / Self Care | Payer: No Typology Code available for payment source | Source: Ambulatory Visit | Attending: Gastroenterology | Admitting: Gastroenterology

## 2018-08-01 DIAGNOSIS — Z1159 Encounter for screening for other viral diseases: Secondary | ICD-10-CM | POA: Diagnosis present

## 2018-08-02 LAB — NOVEL CORONAVIRUS, NAA (HOSP ORDER, SEND-OUT TO REF LAB; TAT 18-24 HRS): SARS-CoV-2, NAA: NOT DETECTED

## 2018-08-03 ENCOUNTER — Encounter: Payer: Self-pay | Admitting: *Deleted

## 2018-08-04 ENCOUNTER — Encounter: Payer: Self-pay | Admitting: Anesthesiology

## 2018-08-04 ENCOUNTER — Other Ambulatory Visit: Payer: Self-pay | Admitting: Gastroenterology

## 2018-08-04 ENCOUNTER — Other Ambulatory Visit: Payer: Self-pay

## 2018-08-04 ENCOUNTER — Ambulatory Visit: Payer: No Typology Code available for payment source | Admitting: Anesthesiology

## 2018-08-04 ENCOUNTER — Ambulatory Visit
Admission: RE | Admit: 2018-08-04 | Discharge: 2018-08-04 | Disposition: A | Discharge status: Home / Self Care | Payer: No Typology Code available for payment source | Attending: Gastroenterology | Admitting: Gastroenterology

## 2018-08-04 ENCOUNTER — Encounter
Admission: RE | Disposition: A | Discharge status: Home / Self Care | Payer: Self-pay | Source: Home / Self Care | Attending: Gastroenterology

## 2018-08-04 ENCOUNTER — Other Ambulatory Visit (HOSPITAL_COMMUNITY): Payer: Self-pay | Admitting: Gastroenterology

## 2018-08-04 DIAGNOSIS — E7800 Pure hypercholesterolemia, unspecified: Secondary | ICD-10-CM | POA: Insufficient documentation

## 2018-08-04 DIAGNOSIS — Z86010 Personal history of colonic polyps: Secondary | ICD-10-CM | POA: Insufficient documentation

## 2018-08-04 DIAGNOSIS — E119 Type 2 diabetes mellitus without complications: Secondary | ICD-10-CM | POA: Insufficient documentation

## 2018-08-04 DIAGNOSIS — B1920 Unspecified viral hepatitis C without hepatic coma: Secondary | ICD-10-CM | POA: Diagnosis not present

## 2018-08-04 DIAGNOSIS — Z888 Allergy status to other drugs, medicaments and biological substances status: Secondary | ICD-10-CM | POA: Diagnosis not present

## 2018-08-04 DIAGNOSIS — Z79899 Other long term (current) drug therapy: Secondary | ICD-10-CM | POA: Insufficient documentation

## 2018-08-04 DIAGNOSIS — C155 Malignant neoplasm of lower third of esophagus: Secondary | ICD-10-CM | POA: Diagnosis not present

## 2018-08-04 DIAGNOSIS — M1990 Unspecified osteoarthritis, unspecified site: Secondary | ICD-10-CM | POA: Diagnosis not present

## 2018-08-04 DIAGNOSIS — Z7984 Long term (current) use of oral hypoglycemic drugs: Secondary | ICD-10-CM | POA: Diagnosis not present

## 2018-08-04 DIAGNOSIS — R1310 Dysphagia, unspecified: Secondary | ICD-10-CM | POA: Insufficient documentation

## 2018-08-04 DIAGNOSIS — G4730 Sleep apnea, unspecified: Secondary | ICD-10-CM | POA: Insufficient documentation

## 2018-08-04 DIAGNOSIS — K2970 Gastritis, unspecified, without bleeding: Secondary | ICD-10-CM | POA: Diagnosis not present

## 2018-08-04 DIAGNOSIS — Z6841 Body Mass Index (BMI) 40.0 and over, adult: Secondary | ICD-10-CM | POA: Diagnosis not present

## 2018-08-04 DIAGNOSIS — I10 Essential (primary) hypertension: Secondary | ICD-10-CM | POA: Diagnosis not present

## 2018-08-04 DIAGNOSIS — C159 Malignant neoplasm of esophagus, unspecified: Secondary | ICD-10-CM

## 2018-08-04 HISTORY — DX: Other specified abnormal immunological findings in serum: R76.8

## 2018-08-04 HISTORY — DX: Benign neoplasm of colon, unspecified: D12.6

## 2018-08-04 HISTORY — PX: ESOPHAGOGASTRODUODENOSCOPY (EGD) WITH PROPOFOL: SHX5813

## 2018-08-04 LAB — GLUCOSE, CAPILLARY: Glucose-Capillary: 119 mg/dL — ABNORMAL HIGH (ref 70–99)

## 2018-08-04 SURGERY — ESOPHAGOGASTRODUODENOSCOPY (EGD) WITH PROPOFOL
Anesthesia: General

## 2018-08-04 MED ORDER — SODIUM CHLORIDE 0.9 % IV SOLN
INTRAVENOUS | Status: DC
  Administered 2018-08-04: 1000 mL via INTRAVENOUS

## 2018-08-04 MED ORDER — LIDOCAINE HCL (CARDIAC) PF 100 MG/5ML IV SOSY
PREFILLED_SYRINGE | INTRAVENOUS | Status: DC | PRN
  Administered 2018-08-04: 100 mg via INTRATRACHEAL

## 2018-08-04 MED ORDER — SODIUM CHLORIDE 0.9 % IV SOLN
INTRAVENOUS | Status: DC
  Administered 2018-08-04: 11:00:00 via INTRAVENOUS

## 2018-08-04 MED ORDER — PROPOFOL 10 MG/ML IV BOLUS
INTRAVENOUS | Status: DC | PRN
  Administered 2018-08-04: 100 mg via INTRAVENOUS
  Administered 2018-08-04 (×2): 50 mg via INTRAVENOUS
  Administered 2018-08-04: 100 mg via INTRAVENOUS
  Administered 2018-08-04 (×2): 50 mg via INTRAVENOUS

## 2018-08-04 MED ORDER — GLYCOPYRROLATE 0.2 MG/ML IJ SOLN
INTRAMUSCULAR | Status: DC | PRN
  Administered 2018-08-04: 0.2 mg via INTRAVENOUS

## 2018-08-04 NOTE — H&P (Signed)
Outpatient short stay form Pre-procedure 08/04/2018 10:47 AM Lollie Sails MD  Primary Physician: Frazier Richards, MD  Reason for visit: EGD  History of present illness: Patient is a 58-year-old male presenting today with complaint of dysphagia.  The symptoms been going on for about the past 6 weeks or so he denies any previous problems with dysphagia.  He has been on a proton pump inhibitor for about a week to 2 weeks.  Denies any problems with heartburn.  He does have poor dentition in the back with loss of lower molars.  Does regurgitate foods.  He denies use of any aspirin or blood thinning agent however he does take tumeric.  He also takes Coricidin every night product is apparently non-aspirin.    Current Facility-Administered Medications:  .  0.9 %  sodium chloride infusion, , Intravenous, Continuous, Lollie Sails, MD, Last Rate: 20 mL/hr at 08/04/18 0949, 1,000 mL at 08/04/18 0949 .  0.9 %  sodium chloride infusion, , Intravenous, Continuous, Lollie Sails, MD  Facility-Administered Medications Ordered in Other Encounters:  .  glycopyrrolate (ROBINUL) injection, , , Anesthesia Intra-op, Fletcher-Harrison, Tawana, CRNA, 0.2 mg at 08/04/18 1022  Medications Prior to Admission  Medication Sig Dispense Refill Last Dose  . atorvastatin (LIPITOR) 20 MG tablet Take 20 mg by mouth daily.   08/03/2018 at Unknown time  . lisinopril (PRINIVIL,ZESTRIL) 20 MG tablet Take 20 mg by mouth daily.   08/03/2018 at Unknown time  . Multiple Vitamin (MULTI-VITAMINS) TABS Take by mouth.   08/03/2018 at Unknown time  . Omega-3 Fatty Acids (FISH OIL) 1000 MG CAPS Take by mouth.   08/03/2018 at Unknown time  . pantoprazole (PROTONIX) 40 MG tablet Take 40 mg by mouth daily.   08/03/2018 at Unknown time  . TURMERIC PO Take by mouth daily.   Past Week at Unknown time  . glipiZIDE (GLUCOTROL XL) 2.5 MG 24 hr tablet Take 2.5 mg by mouth daily with breakfast.   Not Taking at Unknown time      Allergies  Allergen Reactions  . Metformin And Related Nausea Only     Past Medical History:  Diagnosis Date  . Diabetes mellitus without complication ([place])   . Hepatitis    HEPATITIS C  . Hypercholesteremia   . Hypertension   . Morbid obesity ([place])   . Positive hepatitis C antibody test   . Sleep apnea   . Tubular adenoma of colon     Review of systems:      Physical Exam    Heart and lungs: The rate and rhythm without rub or gallop, lungs are bilaterally clear.    HEENT: Normocephalic atraumatic eyes are anicteric    Other:    Pertinant exam for procedure: Left nontender nondistended bowel sounds positive normoactive, obese.    Planned proceedures: EGD and indicated procedures. I have discussed the risks benefits and complications of procedures to include not limited to bleeding, infection, perforation and the risk of sedation and the patient wishes to proceed.    Lollie Sails, MD Gastroenterology 08/04/2018  10:47 AM

## 2018-08-04 NOTE — Anesthesia Preprocedure Evaluation (Signed)
Anesthesia Evaluation  Patient identified by MRN, date of birth, ID band Patient awake    Reviewed: Allergy & Precautions, NPO status , Patient's Chart, lab work & pertinent test results, reviewed documented beta blocker date and time   Airway Mallampati: III  TM Distance: >3 FB     Dental  (+) Chipped   Pulmonary sleep apnea ,           Cardiovascular hypertension, Pt. on medications      Neuro/Psych    GI/Hepatic (+) Hepatitis -  Endo/Other  diabetes, Type 2Morbid obesity  Renal/GU      Musculoskeletal  (+) Arthritis ,   Abdominal   Peds  Hematology   Anesthesia Other Findings Last EKG ok.  Reproductive/Obstetrics                             Anesthesia Physical Anesthesia Plan  ASA: III  Anesthesia Plan: General   Post-op Pain Management:    Induction: Intravenous  PONV Risk Score and Plan:   Airway Management Planned:   Additional Equipment:   Intra-op Plan:   Post-operative Plan:   Informed Consent: I have reviewed the patients History and Physical, chart, labs and discussed the procedure including the risks, benefits and alternatives for the proposed anesthesia with the patient or authorized representative who has indicated his/her understanding and acceptance.       Plan Discussed with: CRNA  Anesthesia Plan Comments:         Anesthesia Quick Evaluation

## 2018-08-04 NOTE — Transfer of Care (Signed)
Immediate Anesthesia Transfer of Care Note  Patient: Matthew Yang  Procedure(s) Performed: ESOPHAGOGASTRODUODENOSCOPY (EGD) WITH PROPOFOL (N/A )  Patient Location: Endoscopy Unit  Anesthesia Type:General  Level of Consciousness: awake, oriented and patient cooperative  Airway & Oxygen Therapy: Patient Spontanous Breathing and Patient connected to face mask oxygen  Post-op Assessment: Report given to RN and Post -op Vital signs reviewed and stable  Post vital signs: Reviewed and stable  Last Vitals:  Vitals Value Taken Time  BP 112/74 08/04/2018 11:16 AM  Temp 36.1 C 08/04/2018 11:10 AM  Pulse 75 08/04/2018 11:16 AM  Resp 20 08/04/2018 11:16 AM  SpO2 99 % 08/04/2018 11:16 AM    Last Pain:  Vitals:   08/04/18 1110  TempSrc: Tympanic  PainSc:          Complications: No apparent anesthesia complications

## 2018-08-04 NOTE — Anesthesia Post-op Follow-up Note (Signed)
Anesthesia QCDR form completed.        

## 2018-08-04 NOTE — Op Note (Addendum)
[facility] Gastroenterology Patient Name: Matthew Yang Procedure Date: 08/04/2018 10:22 AM MRN: [id number] Account #: [id number] Date of Birth: 10/30/1960 Admit Type: Outpatient Age: 58 Room: [facility] ENDO ROOM 3 Gender: Male Note Status: Finalized Procedure:            Upper GI endoscopy Indications:          Dysphagia Providers:            Lollie Sails, MD Referring MD:         Ocie Cornfield. Ouida Sills MD, MD (Referring MD) Medicines:            Monitored Anesthesia Care Complications:        No immediate complications. Procedure:            Pre-Anesthesia Assessment:                       - ASA Grade Assessment: III - A patient with severe                        systemic disease.                       After obtaining informed consent, the endoscope was                        passed under direct vision. Throughout the procedure,                        the patient's blood pressure, pulse, and oxygen                        saturations were monitored continuously. The Endoscope                        was introduced through the mouth, and advanced to the                        third part of duodenum. The upper GI endoscopy was                        accomplished without difficulty. The patient tolerated                        the procedure well. Findings:      A large, fungating and submucosal mass with friable and no stigmata of       recent bleeding was found in the lower third of the esophagus, 40 cm       from the incisors. The mass was partially obstructing and       circumferential. Biopsies were taken with a cold forceps for histology.       The mass extends into the cardia and separate biopsies were taken from       the region of the GE junction and the cardia. The overall length of the       mass is about 8-9 cm.      Patchy minimal inflammation characterized by congestion (edema) was       found in the gastric body.      The mass is also noted on  retroflexion evaluation of the cardia.       Biopsies of the cardia are  taken in retroflec position.      The examined duodenum was normal. Impression:           - Partially obstructing, rule out malignancy,                        esophageal tumor was found in the lower third of the                        esophagus. Biopsied.                       - Gastritis.                       - Normal examined duodenum. Recommendation:       - Discharge patient to home.                       - Use Protonix (pantoprazole) 40 mg PO daily.                       - Mechanical soft diet daily, Small bites, crush foods.                       - Perform a CT scan (computed tomography) of chest with                        contrast, abdomen with contrast and pelvis with                        contrast at appointment to be scheduled.                       - Await pathology results. Procedure Code(s):    --- Professional ---                       [phone], Esophagogastroduodenoscopy, flexible, transoral;                        with biopsy, single or multiple Diagnosis Code(s):    --- Professional ---                       D49.0, Neoplasm of unspecified behavior of digestive                        system                       K29.70, Gastritis, unspecified, without bleeding                       R13.10, Dysphagia, unspecified CPT copyright 2019 American Medical Association. All rights reserved. The codes documented in this report are preliminary and upon coder review may  be revised to meet current compliance requirements. Lollie Sails, MD 08/04/2018 11:19:15 AM This report has been signed electronically. Number of Addenda: 0 Note Initiated On: 08/04/2018 10:22 AM      [facility]

## 2018-08-04 NOTE — Anesthesia Postprocedure Evaluation (Signed)
Anesthesia Post Note  Patient: Matthew Yang  Procedure(s) Performed: ESOPHAGOGASTRODUODENOSCOPY (EGD) WITH PROPOFOL (N/A )  Patient location during evaluation: Endoscopy Anesthesia Type: General Level of consciousness: awake and alert Pain management: pain level controlled Vital Signs Assessment: post-procedure vital signs reviewed and stable Respiratory status: spontaneous breathing, nonlabored ventilation, respiratory function stable and patient connected to nasal cannula oxygen Cardiovascular status: blood pressure returned to baseline and stable Postop Assessment: no apparent nausea or vomiting Anesthetic complications: no     Last Vitals:  Vitals:   08/04/18 1120 08/04/18 1130  BP: 115/83 (!) 165/96  Pulse: 70   Resp: 20   Temp:    SpO2: 99%     Last Pain:  Vitals:   08/04/18 1110  TempSrc: Tympanic  PainSc:                  Marcille Barman S

## 2018-08-07 ENCOUNTER — Encounter: Payer: Self-pay | Admitting: Gastroenterology

## 2018-08-07 ENCOUNTER — Other Ambulatory Visit: Payer: Self-pay | Admitting: Pathology

## 2018-08-07 LAB — SURGICAL PATHOLOGY

## 2018-08-07 NOTE — Progress Notes (Signed)
Navigator chart review

## 2018-08-09 ENCOUNTER — Ambulatory Visit
Admission: RE | Admit: 2018-08-09 | Discharge: 2018-08-09 | Disposition: A | Discharge status: Home / Self Care | Payer: No Typology Code available for payment source | Source: Ambulatory Visit | Attending: Gastroenterology | Admitting: Gastroenterology

## 2018-08-09 ENCOUNTER — Other Ambulatory Visit: Payer: Self-pay

## 2018-08-09 DIAGNOSIS — C159 Malignant neoplasm of esophagus, unspecified: Secondary | ICD-10-CM | POA: Diagnosis not present

## 2018-08-09 MED ORDER — IOHEXOL 300 MG/ML  SOLN
125.0000 mL | Freq: Once | INTRAMUSCULAR | Status: AC | PRN
  Administered 2018-08-09: 125 mL via INTRAVENOUS

## 2018-08-10 ENCOUNTER — Other Ambulatory Visit: Payer: No Typology Code available for payment source

## 2018-08-11 NOTE — Progress Notes (Signed)
Tumor Board Documentation  Matthew Yang was presented by Verlon Au, RN at our Tumor Board on 08/10/2018, which included representatives from medical oncology, radiation oncology, surgical, radiology, pathology, navigation, internal medicine, research.  Matthew Yang currently presents for discussion, for [place], for new positive pathology with history of the following treatments: surgical intervention(s).  Additionally, we reviewed previous medical and familial history, history of present illness, and recent lab results along with all available histopathologic and imaging studies. The tumor board considered available treatment options and made the following recommendations: Concurrent chemo-radiation therapy, Additional screening Referral to Med Onc made, Will need PET scan  The following procedures/referrals were also placed: No orders of the defined types were placed in this encounter.   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  Tumor board is a meeting of clinicians from various specialty areas who evaluate and discuss patients for whom a multidisciplinary approach is being considered. Final determinations in the plan of care are those of the provider(s). The responsibility for follow up of recommendations given during tumor board is that of the provider.   Today's extended care, comprehensive team conference, Matthew Yang was not present for the discussion and was not examined.   Multidisciplinary Tumor Board is a multidisciplinary case peer review process.  Decisions discussed in the Multidisciplinary Tumor Board reflect the opinions of the specialists present at the conference without having examined the patient.  Ultimately, treatment and diagnostic decisions rest with the primary provider(s) and the  patient.

## 2018-08-15 ENCOUNTER — Other Ambulatory Visit: Payer: Self-pay

## 2018-08-15 ENCOUNTER — Inpatient Hospital Stay: Payer: No Typology Code available for payment source

## 2018-08-15 ENCOUNTER — Inpatient Hospital Stay: Payer: No Typology Code available for payment source | Attending: Internal Medicine | Admitting: Internal Medicine

## 2018-08-15 ENCOUNTER — Encounter: Payer: Self-pay | Admitting: Internal Medicine

## 2018-08-15 ENCOUNTER — Telehealth (INDEPENDENT_AMBULATORY_CARE_PROVIDER_SITE_OTHER): Payer: Self-pay

## 2018-08-15 ENCOUNTER — Other Ambulatory Visit (INDEPENDENT_AMBULATORY_CARE_PROVIDER_SITE_OTHER): Payer: Self-pay | Admitting: Nurse Practitioner

## 2018-08-15 DIAGNOSIS — R1310 Dysphagia, unspecified: Secondary | ICD-10-CM

## 2018-08-15 DIAGNOSIS — Z9049 Acquired absence of other specified parts of digestive tract: Secondary | ICD-10-CM | POA: Insufficient documentation

## 2018-08-15 DIAGNOSIS — C155 Malignant neoplasm of lower third of esophagus: Secondary | ICD-10-CM | POA: Diagnosis present

## 2018-08-15 DIAGNOSIS — G4730 Sleep apnea, unspecified: Secondary | ICD-10-CM

## 2018-08-15 DIAGNOSIS — C772 Secondary and unspecified malignant neoplasm of intra-abdominal lymph nodes: Secondary | ICD-10-CM | POA: Diagnosis not present

## 2018-08-15 DIAGNOSIS — E119 Type 2 diabetes mellitus without complications: Secondary | ICD-10-CM | POA: Diagnosis not present

## 2018-08-15 DIAGNOSIS — I10 Essential (primary) hypertension: Secondary | ICD-10-CM | POA: Diagnosis not present

## 2018-08-15 DIAGNOSIS — K219 Gastro-esophageal reflux disease without esophagitis: Secondary | ICD-10-CM | POA: Diagnosis not present

## 2018-08-15 DIAGNOSIS — Z5111 Encounter for antineoplastic chemotherapy: Secondary | ICD-10-CM | POA: Insufficient documentation

## 2018-08-15 DIAGNOSIS — Z79899 Other long term (current) drug therapy: Secondary | ICD-10-CM

## 2018-08-15 LAB — CBC WITH DIFFERENTIAL/PLATELET
Abs Immature Granulocytes: 0.02 10*3/uL (ref 0.00–0.07)
Basophils Absolute: 0 10*3/uL (ref 0.0–0.1)
Basophils Relative: 0 %
Eosinophils Absolute: 0.2 10*3/uL (ref 0.0–0.5)
Eosinophils Relative: 2 %
HCT: 40.9 % (ref 39.0–52.0)
Hemoglobin: 13.8 g/dL (ref 13.0–17.0)
Immature Granulocytes: 0 %
Lymphocytes Relative: 20 %
Lymphs Abs: 1.8 10*3/uL (ref 0.7–4.0)
MCH: 30.2 pg (ref 26.0–34.0)
MCHC: 33.7 g/dL (ref 30.0–36.0)
MCV: 89.5 fL (ref 80.0–100.0)
Monocytes Absolute: 1.2 10*3/uL — ABNORMAL HIGH (ref 0.1–1.0)
Monocytes Relative: 14 %
Neutro Abs: 5.7 10*3/uL (ref 1.7–7.7)
Neutrophils Relative %: 64 %
Platelets: 211 10*3/uL (ref 150–400)
RBC: 4.57 MIL/uL (ref 4.22–5.81)
RDW: 12.2 % (ref 11.5–15.5)
WBC: 9 10*3/uL (ref 4.0–10.5)
nRBC: 0 % (ref 0.0–0.2)

## 2018-08-15 LAB — COMPREHENSIVE METABOLIC PANEL
ALT: 25 U/L (ref 0–44)
AST: 26 U/L (ref 15–41)
Albumin: 4 g/dL (ref 3.5–5.0)
Alkaline Phosphatase: 86 U/L (ref 38–126)
Anion gap: 8 (ref 5–15)
BUN: 16 mg/dL (ref 6–20)
CO2: 24 mmol/L (ref 22–32)
Calcium: 9.1 mg/dL (ref 8.9–10.3)
Chloride: 104 mmol/L (ref 98–111)
Creatinine, Ser: 0.83 mg/dL (ref 0.61–1.24)
GFR calc Af Amer: >60 mL/min (ref >60)
GFR calc non Af Amer: >60 mL/min (ref >60)
Glucose, Bld: 168 mg/dL — ABNORMAL HIGH (ref 70–99)
Potassium: 4.3 mmol/L (ref 3.5–5.1)
Sodium: 136 mmol/L (ref 135–145)
Total Bilirubin: 1.3 mg/dL — ABNORMAL HIGH (ref 0.3–1.2)
Total Protein: 7.6 g/dL (ref 6.5–8.1)

## 2018-08-15 NOTE — Assessment & Plan Note (Addendum)
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  June 2020 CT scan shows ~2 cm gastrohepatic lymph nodes; and up to 12 mm retroperitoneal/aortocaval lymph node highly suspicious for distant metastasis.  I discussed regarding aggressive nature of the disease /also regarding concerning nature of lymph nodes/concern for advanced stage disease.  However I would recommend a PET scan for further evaluation.  # If retroperitoneal adenopathy is positive PET scan-surgery is less likely; patient will need palliative chemotherapy/radiation therapy.   #If patient is a surgical candidate-intensive chemotherapy FLOT would be recommended preoperatively.   # Patient will need NGS testing; patient will need a port placement-IV access for chemotherapy.  # Difficulty swallowing/dysphagia-secondary to malignant tumor.  Recommend palliative radiation.  Recommend evaluation with Almyra Free, nutrition.  Thank you Dr.Skulskie for allowing me to participate in the care of your pleasant patient. Please do not hesitate to contact me with questions or concerns in the interim.  # DISPOSITION: # labs- CBC/CMP/CEA # referral for port; chemo ed in 1 week.  # Dr.crystal re: esophagus cancer # PET ASAP # follow up with MD 1-2 days after PET scan.  # I reviewed the blood work- with the patient in detail; also reviewed the imaging independently [as summarized above]; and with the patient in detail.

## 2018-08-15 NOTE — Telephone Encounter (Signed)
Spoke with the patient and have him scheduled for 08/17/2018 with Dr. Lucky Cowboy with a 7:30 am arrival time. Patient will do his Covid testing on 08/16/2018 between 10:30-12:30 pm. The zero visitor policy and pre-procedure instructions were discussed.

## 2018-08-15 NOTE — Progress Notes (Signed)
[employer] requisition sent to pathology with confirmation of receipt.

## 2018-08-15 NOTE — Progress Notes (Signed)
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; lower one third of the esophagus/GE junction adenocarcinoma [Dr.Skulskie]; partial obstructing fungating mass noted in the lower third esophagus extending into the cardia; June 2020 CT scan-  Upper abdominal lymphadenopathy [ 21 mm gastrohepatic node;  17 mm  celiac axis node; 17 mm portacaval node;  Small retroperitoneal nodes 12 mm  aortocaval region  # DM- diet controlled;   DIAGNOSIS: Adenocarcinoma esophagus/GE junction  STAGE: Pending       ;GOALS:  CURRENT/MOST RECENT THERAPY [ ]         Malignant neoplasm of lower third of esophagus (HCC)     HISTORY OF PRESENTING ILLNESS:  Matthew Yang 58 y.o.  male with newly diagnosed esophagus adenocarcinoma is here for initial consultation.  Patient noted to have difficulty with swallowing for the last 2 months or so.  Positive for regurgitation.  Positive for about 30 pound weight loss in the last 2 months.  Otherwise denies any bone pain.  Denies any headaches.  Denies any neuropathy.  Review of Systems  Constitutional: Positive for weight loss. Negative for chills, diaphoresis, fever and malaise/fatigue.  HENT: Negative for nosebleeds and sore throat.   Eyes: Negative for double vision.  Respiratory: Negative for cough, hemoptysis, sputum production, shortness of breath and wheezing.   Cardiovascular: Negative for chest pain, palpitations, orthopnea and leg swelling.  Gastrointestinal: Positive for vomiting. Negative for abdominal pain, blood in stool, constipation, diarrhea, heartburn, melena and nausea.       Regurgitation.  Genitourinary: Negative for dysuria, frequency and urgency.  Musculoskeletal: Negative for back pain  and joint pain.  Skin: Negative.  Negative for itching and rash.  Neurological: Negative for dizziness, tingling, focal weakness, weakness and headaches.  Endo/Heme/Allergies: Does not bruise/bleed easily.  Psychiatric/Behavioral: Negative for depression. The patient is not nervous/anxious and does not have insomnia.      MEDICAL HISTORY:  Past Medical History:  Diagnosis Date  . Diabetes mellitus without complication ([place])   . Hepatitis    HEPATITIS C  . Hypercholesteremia   . Hypertension   . Morbid obesity ([place])   . Positive hepatitis C antibody test   . Sleep apnea   . Tubular adenoma of colon     SURGICAL HISTORY: Past Surgical History:  Procedure Laterality Date  . CHOLECYSTECTOMY    . COLONOSCOPY    . COLONOSCOPY WITH PROPOFOL N/A 05/04/2016   Procedure: COLONOSCOPY WITH PROPOFOL;  Surgeon: Lollie Sails, MD;  Location: [facility] ENDOSCOPY;  Service: Endoscopy;  Laterality: N/A;  . ESOPHAGOGASTRODUODENOSCOPY (EGD) WITH PROPOFOL N/A 08/04/2018   Procedure: ESOPHAGOGASTRODUODENOSCOPY (EGD) WITH PROPOFOL;  Surgeon: Lollie Sails, MD;  Location: [facility] ENDOSCOPY;  Service: Endoscopy;  Laterality: N/A;    SOCIAL HISTORY: Social History   Socioeconomic History  . Marital status: Married    Spouse name: Not on file  . Number of children: Not on file  . Years of education: Not on file  . Highest education level: Not on file  Occupational History  . Not on file  Social Needs  . Financial resource strain: Not on file  . Food insecurity:    Worry: Not on file    Inability: Not on file  . Transportation needs:    Medical: Not on file  Non-medical: Not on file  Tobacco Use  . Smoking status: Never Smoker  . Smokeless tobacco: Never Used  Substance and Sexual Activity  . Alcohol use: Yes    Alcohol/week: 9.0 standard drinks    Types: 9 Cans of beer per week    Comment: beers weekly  . Drug use: No  . Sexual activity: Not on file  Lifestyle  . Physical  activity:    Days per week: Not on file    Minutes per session: Not on file  . Stress: Not on file  Relationships  . Social connections:    Talks on phone: Not on file    Gets together: Not on file    Attends religious service: Not on file    Active member of club or organization: Not on file    Attends meetings of clubs or organizations: Not on file    Relationship status: Not on file  . Intimate partner violence:    Fear of current or ex partner: Not on file    Emotionally abused: Not on file    Physically abused: Not on file    Forced sexual activity: Not on file  Other Topics Concern  . 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     FAMILY HISTORY: Family History  Family history unknown: Yes    ALLERGIES:  has No Known Allergies.  MEDICATIONS:  Current Outpatient Medications  Medication Sig Dispense Refill  . atorvastatin (LIPITOR) 20 MG tablet Take 20 mg by mouth daily.    Marland Kitchen lisinopril (PRINIVIL,ZESTRIL) 20 MG tablet Take 20 mg by mouth daily.    . Omega-3 Fatty Acids (FISH OIL) 1000 MG CAPS Take 1,000 mg by mouth daily.     . pantoprazole (PROTONIX) 40 MG tablet Take 40 mg by mouth daily.    . Multiple Vitamin (MULTIVITAMIN WITH MINERALS) TABS tablet Take 1 tablet by mouth daily.    . Turmeric 500 MG CAPS Take 500 mg by mouth daily.     No current facility-administered medications for this visit.       Marland Kitchen  PHYSICAL EXAMINATION: ECOG PERFORMANCE STATUS: 1 - Symptomatic but completely ambulatory  Vitals:   08/15/18 0826  BP: 119/80  Pulse: 93  Resp: 20  Temp: (!) 95.7 F (35.4 C)   Filed Weights   08/15/18 0826  Weight: (!) 315 lb (142.9 kg)    Physical Exam  Constitutional: He is oriented to person, place, and time and well-developed, well-nourished, and in no distress.  HENT:  Head: Normocephalic and atraumatic.  Mouth/Throat: Oropharynx is clear and moist. No oropharyngeal  exudate.  Eyes: Pupils are equal, round, and reactive to light.  Neck: Normal range of motion. Neck supple.  Cardiovascular: Normal rate and regular rhythm.  Pulmonary/Chest: Effort normal and breath sounds normal. No respiratory distress. He has no wheezes.  Abdominal: Soft. Bowel sounds are normal. He exhibits no distension and no mass. There is no abdominal tenderness. There is no rebound and no guarding.  Musculoskeletal: Normal range of motion.        General: No tenderness or edema.  Neurological: He is alert and oriented to person, place, and time.  Skin: Skin is warm.  Psychiatric: Affect normal.     LABORATORY DATA:  I have reviewed the data as listed Lab Results  Component Value Date   WBC 9.0 08/15/2018   HGB 13.8 08/15/2018   HCT 40.9  08/15/2018   MCV 89.5 08/15/2018   PLT 211 08/15/2018   Recent Labs    08/15/18 0943  NA 136  K 4.3  CL 104  CO2 24  GLUCOSE 168*  BUN 16  CREATININE 0.83  CALCIUM 9.1  GFRNONAA >60  GFRAA >60  PROT 7.6  ALBUMIN 4.0  AST 26  ALT 25  ALKPHOS 86  BILITOT 1.3*    RADIOGRAPHIC STUDIES: I have personally reviewed the radiological images as listed and agreed with the findings in the report. Ct Chest W Contrast  Result Date: 08/09/2018 CLINICAL DATA:  Difficulty swallowing x2 months, upper abdominal pain. Newly diagnosed esophageal cancer. EXAM: CT CHEST, ABDOMEN, AND PELVIS WITH CONTRAST TECHNIQUE: Multidetector CT imaging of the chest, abdomen and pelvis was performed following the standard protocol during bolus administration of intravenous contrast. CONTRAST:  112mL OMNIPAQUE IOHEXOL 300 MG/ML  SOLN COMPARISON:  CT abdomen/pelvis dated 04/17/2010 FINDINGS: CT CHEST FINDINGS Cardiovascular: The heart is normal in size. No pericardial effusion. No evidence of thoracic aortic aneurysm. Mild atherosclerotic calcifications of the aortic arch. Three vessel coronary atherosclerosis. Mediastinum/Nodes: No suspicious mediastinal  lymphadenopathy. Specifically, no lower esophageal nodes. Masslike thickening of the GE junction/gastric cardia (series 2/image 53), likely centered in the proximal stomach. Visualized thyroid is unremarkable. Lungs/Pleura: No suspicious pulmonary nodules. No focal consolidation. No pleural effusion or pneumothorax. Musculoskeletal: Degenerative changes of the lower thoracic spine. CT ABDOMEN PELVIS FINDINGS Hepatobiliary: Liver is within normal limits. Status post cholecystectomy. No intrahepatic or extrahepatic ductal dilatation. Pancreas: Within normal limits. Spleen: Within normal limits. Adrenals/Urinary Tract: Adrenal glands are within normal limits. Kidneys are within normal limits.  No hydronephrosis. Bladder is within normal limits. Stomach/Bowel: Masslike thickening at the GE junction extending to the gastric cardia (series 2/images 53 and 58). On CT, the mass appears to be centered in the stomach rather than the distal esophagus (sagittal image 156). No evidence of bowel obstruction. Normal appendix (series 2/image 98). No colonic wall thickening or mass is evident on CT. Vascular/Lymphatic: No evidence of abdominal aortic aneurysm. Upper abdominal lymphadenopathy, including a 21 mm short axis gastrohepatic node (series 2/image 89) and a 17 mm short axis celiac axis node (series 2/image 62). 17 mm short axis portacaval node (series 2/image 69). Small retroperitoneal nodes, measuring up to 12 mm short axis in the aortocaval region (series 2/image 76). Reproductive: Prostate is notable for dystrophic calcifications. Other: No abdominopelvic ascites. Musculoskeletal: Degenerative changes of the lumbar spine. IMPRESSION: Masslike thickening at the GE junction extending to the gastric cardia, which appears to be centered in the proximal stomach on CT, likely corresponding to the patient's known gastroesophageal neoplasm. Upper abdominal nodal metastases, including a 2.1 cm short axis gastrohepatic node. Small  retroperitoneal nodes measuring up to 12 mm short axis in the aortocaval region. Electronically Signed   By: Julian Hy M.D.   On: 08/09/2018 13:38   Ct Abdomen Pelvis W Contrast  Result Date: 08/09/2018 CLINICAL DATA:  Difficulty swallowing x2 months, upper abdominal pain. Newly diagnosed esophageal cancer. EXAM: CT CHEST, ABDOMEN, AND PELVIS WITH CONTRAST TECHNIQUE: Multidetector CT imaging of the chest, abdomen and pelvis was performed following the standard protocol during bolus administration of intravenous contrast. CONTRAST:  152mL OMNIPAQUE IOHEXOL 300 MG/ML  SOLN COMPARISON:  CT abdomen/pelvis dated 04/17/2010 FINDINGS: CT CHEST FINDINGS Cardiovascular: The heart is normal in size. No pericardial effusion. No evidence of thoracic aortic aneurysm. Mild atherosclerotic calcifications of the aortic arch. Three vessel coronary atherosclerosis. Mediastinum/Nodes: No suspicious mediastinal lymphadenopathy. Specifically,  no lower esophageal nodes. Masslike thickening of the GE junction/gastric cardia (series 2/image 53), likely centered in the proximal stomach. Visualized thyroid is unremarkable. Lungs/Pleura: No suspicious pulmonary nodules. No focal consolidation. No pleural effusion or pneumothorax. Musculoskeletal: Degenerative changes of the lower thoracic spine. CT ABDOMEN PELVIS FINDINGS Hepatobiliary: Liver is within normal limits. Status post cholecystectomy. No intrahepatic or extrahepatic ductal dilatation. Pancreas: Within normal limits. Spleen: Within normal limits. Adrenals/Urinary Tract: Adrenal glands are within normal limits. Kidneys are within normal limits.  No hydronephrosis. Bladder is within normal limits. Stomach/Bowel: Masslike thickening at the GE junction extending to the gastric cardia (series 2/images 53 and 58). On CT, the mass appears to be centered in the stomach rather than the distal esophagus (sagittal image 156). No evidence of bowel obstruction. Normal appendix (series  2/image 98). No colonic wall thickening or mass is evident on CT. Vascular/Lymphatic: No evidence of abdominal aortic aneurysm. Upper abdominal lymphadenopathy, including a 21 mm short axis gastrohepatic node (series 2/image 89) and a 17 mm short axis celiac axis node (series 2/image 62). 17 mm short axis portacaval node (series 2/image 69). Small retroperitoneal nodes, measuring up to 12 mm short axis in the aortocaval region (series 2/image 76). Reproductive: Prostate is notable for dystrophic calcifications. Other: No abdominopelvic ascites. Musculoskeletal: Degenerative changes of the lumbar spine. IMPRESSION: Masslike thickening at the GE junction extending to the gastric cardia, which appears to be centered in the proximal stomach on CT, likely corresponding to the patient's known gastroesophageal neoplasm. Upper abdominal nodal metastases, including a 2.1 cm short axis gastrohepatic node. Small retroperitoneal nodes measuring up to 12 mm short axis in the aortocaval region. Electronically Signed   By: Julian Hy M.D.   On: 08/09/2018 13:38   Dg Esophagus W Double Cm (hd)  Result Date: 07/27/2018 CLINICAL DATA:  Pt reports dysphagia, solids and liquids getting stuck, regurgitating food x 6 weeks. EXAM: ESOPHOGRAM / BARIUM SWALLOW / BARIUM TABLET STUDY TECHNIQUE: Combined double contrast and single contrast examination performed using effervescent crystals, thick barium liquid, and thin barium liquid. The patient was observed with fluoroscopy swallowing a 13 mm barium sulphate tablet. FLUOROSCOPY TIME:  Fluoroscopy Time:  1 minutes 24 seconds Radiation Exposure Index (if provided by the fluoroscopic device): 26.7 mGy Number of Acquired Spot Images: 0 COMPARISON:  None. FINDINGS: There was normal pharyngeal anatomy and motility. Contrast flowed freely through the esophagus without evidence of a mass. Mild focal narrowing of the distal esophagus just proximal to the gastroesophageal junction with  smooth margins most concerning for a stricture measuring approximately 10 mm. The stricture does restrict the passage of a 13 mm barium tablet. There was normal esophageal mucosa without evidence of irregularity or ulceration. Esophageal motility was normal. Severe gastroesophageal reflux extending to the level of the thoracic inlet. No definite hiatal hernia was demonstrated. IMPRESSION: 1. Mild stricture of the distal esophagus just proximal to the esophagogastric junction measuring approximately 10 mm in length. 2. Severe gastroesophageal reflux extending to the level of the thoracic inlet. Electronically Signed   By: Kathreen Devoid   On: 07/27/2018 09:28    ASSESSMENT & PLAN:   Malignant neoplasm of lower third of esophagus ([place]) #Adenocarcinoma [poorly differentiated/question signet ring morphology] lower third of esophagus/GE junction.  June 2020 CT scan shows ~2 cm gastrohepatic lymph nodes; and up to 12 mm retroperitoneal/aortocaval lymph node highly suspicious for distant metastasis.  I discussed regarding aggressive nature of the disease /also regarding concerning nature of lymph nodes/concern for advanced  stage disease.  However I would recommend a PET scan for further evaluation.  # If retroperitoneal adenopathy is positive PET scan-surgery is less likely; patient will need palliative chemotherapy/radiation therapy.   #If patient is a surgical candidate-intensive chemotherapy FLOT would be recommended preoperatively.   # Patient will need NGS testing; patient will need a port placement-IV access for chemotherapy.  # Difficulty swallowing/dysphagia-secondary to malignant tumor.  Recommend palliative radiation.  Recommend evaluation with Almyra Free, nutrition.  Thank you Dr.Skulskie for allowing me to participate in the care of your pleasant patient. Please do not hesitate to contact me with questions or concerns in the interim.  # DISPOSITION: # labs- CBC/CMP/CEA # referral for port; chemo ed  in 1 week.  # Dr.crystal re: esophagus cancer # PET ASAP # follow up with MD 1-2 days after PET scan.  # I reviewed the blood work- with the patient in detail; also reviewed the imaging independently [as summarized above]; and with the patient in detail.   All questions were answered. The patient knows to call the clinic with any problems, questions or concerns.    Cammie Sickle, MD 08/15/2018 11:34 AM

## 2018-08-15 NOTE — Progress Notes (Signed)
Met with Mr. Halt before and during consult with Dr. Rogue Bussing. Introduced Therapist, nutritional and provided contact information for future needs. All appointments arranged and went over before leaving clinic today. Encouraged to call with any questions or needs. AV&V arranging for port this Thursday.

## 2018-08-16 ENCOUNTER — Other Ambulatory Visit
Admission: RE | Admit: 2018-08-16 | Discharge: 2018-08-16 | Disposition: A | Discharge status: Home / Self Care | Payer: No Typology Code available for payment source | Source: Ambulatory Visit | Attending: Vascular Surgery | Admitting: Vascular Surgery

## 2018-08-16 ENCOUNTER — Telehealth: Payer: Self-pay | Admitting: *Deleted

## 2018-08-16 DIAGNOSIS — Z01812 Encounter for preprocedural laboratory examination: Secondary | ICD-10-CM | POA: Insufficient documentation

## 2018-08-16 DIAGNOSIS — Z1159 Encounter for screening for other viral diseases: Secondary | ICD-10-CM | POA: Diagnosis not present

## 2018-08-16 LAB — SARS CORONAVIRUS 2 BY RT PCR (HOSPITAL ORDER, PERFORMED IN ~~LOC~~ HOSPITAL LAB): SARS Coronavirus 2: NEGATIVE

## 2018-08-16 LAB — CEA: CEA: 1.7 ng/mL (ref 0.0–4.7)

## 2018-08-16 MED ORDER — CEFAZOLIN SODIUM-DEXTROSE 2-4 GM/100ML-% IV SOLN
2.0000 g | Freq: Once | INTRAVENOUS | Status: AC
  Administered 2018-08-17: 09:00:00 2 g via INTRAVENOUS

## 2018-08-16 NOTE — Telephone Encounter (Signed)
Wife spoke to [place] in our [employer] prior auth to inquire if PA for port placement was submitted to the focus plan.  She contacted the focus plan, which stated that they were waiting on records to be faxed to the plan for prior approval for port placement. Wife/pt still want to proceed with port placement to avoid delays in care. Case is posted at 730 am tomorrow.  Call attempt to Mickel Baas at Dr. Bunnie Domino office to inquire about updates on any PA being sent to insurance for this procedure. Office is closed on Wednesday. Sent secure chat- to Mickel Baas to request for follow-up. Wife would like a return phone call to her cell phone by Dr. Bunnie Domino office to confirm if records were sent.

## 2018-08-17 ENCOUNTER — Telehealth: Payer: Self-pay | Admitting: Internal Medicine

## 2018-08-17 ENCOUNTER — Encounter
Admission: RE | Disposition: A | Discharge status: Home / Self Care | Payer: Self-pay | Source: Home / Self Care | Attending: Vascular Surgery

## 2018-08-17 ENCOUNTER — Other Ambulatory Visit: Payer: Self-pay

## 2018-08-17 ENCOUNTER — Ambulatory Visit
Admission: RE | Admit: 2018-08-17 | Discharge: 2018-08-17 | Disposition: A | Discharge status: Home / Self Care | Payer: No Typology Code available for payment source | Attending: Vascular Surgery | Admitting: Vascular Surgery

## 2018-08-17 ENCOUNTER — Encounter: Payer: Self-pay | Admitting: *Deleted

## 2018-08-17 DIAGNOSIS — E119 Type 2 diabetes mellitus without complications: Secondary | ICD-10-CM | POA: Diagnosis not present

## 2018-08-17 DIAGNOSIS — R1310 Dysphagia, unspecified: Secondary | ICD-10-CM | POA: Diagnosis not present

## 2018-08-17 DIAGNOSIS — R634 Abnormal weight loss: Secondary | ICD-10-CM | POA: Insufficient documentation

## 2018-08-17 DIAGNOSIS — C155 Malignant neoplasm of lower third of esophagus: Secondary | ICD-10-CM | POA: Diagnosis not present

## 2018-08-17 DIAGNOSIS — B1920 Unspecified viral hepatitis C without hepatic coma: Secondary | ICD-10-CM | POA: Insufficient documentation

## 2018-08-17 DIAGNOSIS — G4730 Sleep apnea, unspecified: Secondary | ICD-10-CM | POA: Diagnosis not present

## 2018-08-17 DIAGNOSIS — E7800 Pure hypercholesterolemia, unspecified: Secondary | ICD-10-CM | POA: Insufficient documentation

## 2018-08-17 DIAGNOSIS — Z79899 Other long term (current) drug therapy: Secondary | ICD-10-CM | POA: Diagnosis not present

## 2018-08-17 DIAGNOSIS — I10 Essential (primary) hypertension: Secondary | ICD-10-CM | POA: Diagnosis not present

## 2018-08-17 DIAGNOSIS — C159 Malignant neoplasm of esophagus, unspecified: Secondary | ICD-10-CM

## 2018-08-17 HISTORY — PX: PORTA CATH INSERTION: CATH118285

## 2018-08-17 SURGERY — PORTA CATH INSERTION
Anesthesia: Moderate Sedation

## 2018-08-17 MED ORDER — FAMOTIDINE 20 MG PO TABS: 40.0000 mg | ORAL_TABLET | Freq: Once | ORAL | Status: DC | PRN

## 2018-08-17 MED ORDER — SODIUM CHLORIDE 0.9 % IV SOLN
INTRAVENOUS | Status: DC
  Administered 2018-08-17: 08:00:00 via INTRAVENOUS

## 2018-08-17 MED ORDER — FENTANYL CITRATE (PF) 100 MCG/2ML IJ SOLN
INTRAMUSCULAR | Status: AC
  Filled 2018-08-17: qty 2

## 2018-08-17 MED ORDER — HEPARIN (PORCINE) IN NACL 1000-0.9 UT/500ML-% IV SOLN
INTRAVENOUS | Status: AC
  Filled 2018-08-17: qty 500

## 2018-08-17 MED ORDER — MIDAZOLAM HCL 5 MG/5ML IJ SOLN
INTRAMUSCULAR | Status: AC
  Filled 2018-08-17: qty 5

## 2018-08-17 MED ORDER — LIDOCAINE-EPINEPHRINE (PF) 1 %-1:200000 IJ SOLN
INTRAMUSCULAR | Status: AC
  Filled 2018-08-17: qty 30

## 2018-08-17 MED ORDER — HYDROMORPHONE HCL 1 MG/ML IJ SOLN: 1.0000 mg | Freq: Once | INTRAMUSCULAR | Status: DC | PRN

## 2018-08-17 MED ORDER — MIDAZOLAM HCL 2 MG/ML PO SYRP: 8.0000 mg | ORAL_SOLUTION | Freq: Once | ORAL | Status: DC | PRN

## 2018-08-17 MED ORDER — MIDAZOLAM HCL 2 MG/2ML IJ SOLN
INTRAMUSCULAR | Status: DC | PRN
  Administered 2018-08-17: 1 mg via INTRAVENOUS
  Administered 2018-08-17: 2 mg via INTRAVENOUS

## 2018-08-17 MED ORDER — METHYLPREDNISOLONE SODIUM SUCC 125 MG IJ SOLR: 125.0000 mg | Freq: Once | INTRAMUSCULAR | Status: DC | PRN

## 2018-08-17 MED ORDER — DIPHENHYDRAMINE HCL 50 MG/ML IJ SOLN: 50.0000 mg | Freq: Once | INTRAMUSCULAR | Status: DC | PRN

## 2018-08-17 MED ORDER — CEFAZOLIN SODIUM-DEXTROSE 2-4 GM/100ML-% IV SOLN
INTRAVENOUS | Status: AC
  Filled 2018-08-17: qty 100

## 2018-08-17 MED ORDER — SODIUM CHLORIDE 0.9 % IV SOLN
Freq: Once | INTRAVENOUS | Status: DC
  Filled 2018-08-17: qty 2

## 2018-08-17 MED ORDER — FENTANYL CITRATE (PF) 100 MCG/2ML IJ SOLN
INTRAMUSCULAR | Status: DC | PRN
  Administered 2018-08-17 (×2): 50 ug via INTRAVENOUS

## 2018-08-17 MED ORDER — ONDANSETRON HCL 4 MG/2ML IJ SOLN: 4.0000 mg | Freq: Four times a day (QID) | INTRAMUSCULAR | Status: DC | PRN

## 2018-08-17 MED ORDER — HEPARIN SODIUM (PORCINE) 10000 UNIT/ML IJ SOLN
INTRAMUSCULAR | Status: AC
  Filled 2018-08-17: qty 1

## 2018-08-17 SURGICAL SUPPLY — 8 items
KIT PORT POWER 8FR ISP CVUE (Port) ×3 IMPLANT
PACK ANGIOGRAPHY (CUSTOM PROCEDURE TRAY) ×3 IMPLANT
PAD GROUND ADULT SPLIT (MISCELLANEOUS) ×3 IMPLANT
PENCIL ELECTRO HAND CTR (MISCELLANEOUS) ×3 IMPLANT
SUT MNCRL AB 4-0 PS2 18 (SUTURE) ×3 IMPLANT
SUT PROLENE 0 CT 1 30 (SUTURE) ×3 IMPLANT
SUT VIC AB 3-0 SH 27 (SUTURE) ×2
SUT VIC AB 3-0 SH 27X BRD (SUTURE) ×1 IMPLANT

## 2018-08-17 NOTE — Discharge Instructions (Signed)
Moderate Conscious Sedation, Adult, Care After  These instructions provide you with information about caring for yourself after your procedure. Your health care provider may also give you more specific instructions. Your treatment has been planned according to current medical practices, but problems sometimes occur. Call your health care provider if you have any problems or questions after your procedure.  What can I expect after the procedure?  After your procedure, it is common:   To feel sleepy for several hours.   To feel clumsy and have poor balance for several hours.   To have poor judgment for several hours.   To vomit if you eat too soon.  Follow these instructions at home:  For at least 24 hours after the procedure:     Do not:  ? Participate in activities where you could fall or become injured.  ? Drive.  ? Use heavy machinery.  ? Drink alcohol.  ? Take sleeping pills or medicines that cause drowsiness.  ? Make important decisions or sign legal documents.  ? Take care of children on your own.   Rest.  Eating and drinking   Follow the diet recommended by your health care provider.   If you vomit:  ? Drink water, juice, or soup when you can drink without vomiting.  ? Make sure you have little or no nausea before eating solid foods.  General instructions   Have a responsible adult stay with you until you are awake and alert.   Take over-the-counter and prescription medicines only as told by your health care provider.   If you smoke, do not smoke without supervision.   Keep all follow-up visits as told by your health care provider. This is important.  Contact a health care provider if:   You keep feeling nauseous or you keep vomiting.   You feel light-headed.   You develop a rash.   You have a fever.  Get help right away if:   You have trouble breathing.  This information is not intended to replace advice given to you by your health care provider. Make sure you discuss any questions you have  with your health care provider.  Document Released: 12/13/2012 Document Revised: 07/28/2015 Document Reviewed: 06/14/2015  Elsevier Interactive Patient Education  2019 [employer]  Implanted Port Home Guide  An implanted port is a device that is placed under the skin. It is usually placed in the chest. The device can be used to give IV medicine, to take blood, or for dialysis. You may have an implanted port if:   You need IV medicine that would be irritating to the small veins in your hands or arms.   You need IV medicines, such as antibiotics, for a long period of time.   You need IV nutrition for a long period of time.   You need dialysis.  Having a port means that your health care provider will not need to use the veins in your arms for these procedures. You may have fewer limitations when using a port than you would if you used other types of long-term IVs, and you will likely be able to return to normal activities after your incision heals.  An implanted port has two main parts:   Reservoir. The reservoir is the part where a needle is inserted to give medicines or draw blood. The reservoir is round. After it is placed, it appears as a small, raised area under your skin.   Catheter. The catheter is a thin,   flexible tube that connects the reservoir to a vein. Medicine that is inserted into the reservoir goes into the catheter and then into the vein.  How is my port accessed?  To access your port:   A numbing cream may be placed on the skin over the port site.   Your health care provider will put on a mask and sterile gloves.   The skin over your port will be cleaned carefully with a germ-killing soap and allowed to dry.   Your health care provider will gently pinch the port and insert a needle into it.   Your health care provider will check for a blood return to make sure the port is in the vein and is not clogged.   If your port needs to remain accessed to get medicine continuously (constant  infusion), your health care provider will place a clear bandage (dressing) over the needle site. The dressing and needle will need to be changed every week, or as told by your health care provider.  What is flushing?  Flushing helps keep the port from getting clogged. Follow instructions from your health care provider about how and when to flush the port. Ports are usually flushed with saline solution or a medicine called heparin. The need for flushing will depend on how the port is used:   If the port is only used from time to time to give medicines or draw blood, the port may need to be flushed:  ? Before and after medicines have been given.  ? Before and after blood has been drawn.  ? As part of routine maintenance. Flushing may be recommended every 4-6 weeks.   If a constant infusion is running, the port may not need to be flushed.   Throw away any syringes in a disposal container that is meant for sharp items (sharps container). You can buy a sharps container from a pharmacy, or you can make one by using an empty hard plastic bottle with a cover.  How long will my port stay implanted?  The port can stay in for as long as your health care provider thinks it is needed. When it is time for the port to come out, a surgery will be done to remove it. The surgery will be similar to the procedure that was done to put the port in.  Follow these instructions at home:     Flush your port as told by your health care provider.   If you need an infusion over several days, follow instructions from your health care provider about how to take care of your port site. Make sure you:  ? Wash your hands with soap and water before you change your dressing. If soap and water are not available, use alcohol-based hand sanitizer.  ? Change your dressing as told by your health care provider.  ? Place any used dressings or infusion bags into a plastic bag. Throw that bag in the trash.  ? Keep the dressing that covers the needle clean  and dry. Do not get it wet.  ? Do not use scissors or sharp objects near the tube.  ? Keep the tube clamped, unless it is being used.   Check your port site every day for signs of infection. Check for:  ? Redness, swelling, or pain.  ? Fluid or blood.  ? Pus or a bad smell.   Protect the skin around the port site.  ? Avoid wearing bra straps that rub or irritate the   site.  ? Protect the skin around your port from seat belts. Place a soft pad over your chest if needed.   Bathe or shower as told by your health care provider. The site may get wet as long as you are not actively receiving an infusion.   Return to your normal activities as told by your health care provider. Ask your health care provider what activities are safe for you.   Carry a medical alert card or wear a medical alert bracelet at all times. This will let health care providers know that you have an implanted port in case of an emergency.  Get help right away if:   You have redness, swelling, or pain at the port site.   You have fluid or blood coming from your port site.   You have pus or a bad smell coming from the port site.   You have a fever.  Summary   Implanted ports are usually placed in the chest for long-term IV access.   Follow instructions from your health care provider about flushing the port and changing bandages (dressings).   Take care of the area around your port by avoiding clothing that puts pressure on the area, and by watching for signs of infection.   Protect the skin around your port from seat belts. Place a soft pad over your chest if needed.   Get help right away if you have a fever or you have redness, swelling, pain, drainage, or a bad smell at the port site.  This information is not intended to replace advice given to you by your health care provider. Make sure you discuss any questions you have with your health care provider.  Document Released: 02/22/2005 Document Revised: 03/27/2016 Document Reviewed:  03/27/2016  Elsevier Interactive Patient Education  2019 [employer]

## 2018-08-17 NOTE — Progress Notes (Signed)
No changes. Discharge instructions given to Matthew Yang and Matthew Yang over phone with questions answered. Vitals stable. Dr Lucky Cowboy in to speak with patient with questions answered. Taking po's without difficulty.

## 2018-08-17 NOTE — H&P (Signed)
[facility] History & Physical Update  The patient was interviewed and re-examined.  The patient's previous History and Physical has been reviewed and is unchanged.  There is no change in the plan of care. We plan to proceed with the scheduled procedure.  Leotis Pain, MD  08/17/2018, 9:34 AM

## 2018-08-17 NOTE — Op Note (Signed)
      [facility]       Operative Note  Date: 08/17/2018  Preoperative diagnosis:  1. Esophageal cancer  Postoperative diagnosis:  Same as above  Procedures: #1. Ultrasound guidance for vascular access to the right internal jugular vein. #2. Fluoroscopic guidance for placement of catheter. #3. Placement of CT compatible Port-A-Cath, right internal jugular vein.  Surgeon: Leotis Pain, MD.   Anesthesia: Local with moderate conscious sedation for approximately 20  minutes using 3 mg of Versed and 100 mcg of Fentanyl  Fluoroscopy time: less than 1 minute  Contrast used: 0  Estimated blood loss: 5 cc  Indication for the procedure:  The patient is a 58 y.o.male with esophageal cancer.  The patient needs a Port-A-Cath for durable venous access, chemotherapy, lab draws, and CT scans. We are asked to place this. Risks and benefits were discussed and informed consent was obtained.  Description of procedure: The patient was brought to the vascular and interventional radiology suite.  Moderate conscious sedation was administered throughout the procedure during a face to face encounter with the patient with my supervision of the RN administering medicines and monitoring the patient's vital signs, pulse oximetry, telemetry and mental status throughout from the start of the procedure until the patient was taken to the recovery room. The right neck chest and shoulder were sterilely prepped and draped, and a sterile surgical field was created. Ultrasound was used to help visualize a patent right internal jugular vein. This was then accessed under direct ultrasound guidance without difficulty with the Seldinger needle and a permanent image was recorded. A J-wire was placed. After skin nick and dilatation, the peel-away sheath was then placed over the wire. I then anesthetized an area under the clavicle approximately 1-2 fingerbreadths. A transverse incision was created and an inferior  pocket was created with electrocautery and blunt dissection. The port was then brought onto the field, placed into the pocket and secured to the chest wall with 2 Prolene sutures. The catheter was connected to the port and tunneled from the subclavicular incision to the access site. Fluoroscopic guidance was then used to cut the catheter to an appropriate length. The catheter was then placed through the peel-away sheath and the peel-away sheath was removed. The catheter tip was parked in excellent location under fluorocoscopic guidance in the cavoatrial junction. The pocket was then irrigated with antibiotic impregnated saline and the wound was closed with a running 3-0 Vicryl and a 4-0 Monocryl. The access incision was closed with a single 4-0 Monocryl. The Huber needle was used to withdraw blood and flush the port with heparinized saline. Dermabond was then placed as a dressing. The patient tolerated the procedure well and was taken to the recovery room in stable condition.   Leotis Pain 08/17/2018 9:34 AM   This note was created with Dragon Medical transcription system. Any errors in dictation are purely unintentional.

## 2018-08-17 NOTE — Telephone Encounter (Signed)
I spoke to patient's wife Matthew Yang, apologize for the miscommunication.  Updated about the serious nature of the diagnosis of esophageal cancer.  PET scan planned on June 15th; further information re: staging/ treatment/ prognosis to follow. GB

## 2018-08-17 NOTE — Progress Notes (Signed)
Placed pt on CPAP pre procedure, tolerating well at this time.

## 2018-08-17 NOTE — Progress Notes (Signed)
Patient clinically stable post port placement per Dr Lucky Cowboy, tolerated well. Vitals stable. Sinus rhythm per monitor. Awake/alert and oriented. Denies complaints.

## 2018-08-18 ENCOUNTER — Encounter
Admission: RE | Admit: 2018-08-18 | Discharge: 2018-08-18 | Disposition: A | Discharge status: Home / Self Care | Payer: No Typology Code available for payment source | Source: Ambulatory Visit | Attending: Internal Medicine | Admitting: Internal Medicine

## 2018-08-18 ENCOUNTER — Other Ambulatory Visit: Payer: Self-pay

## 2018-08-18 DIAGNOSIS — C155 Malignant neoplasm of lower third of esophagus: Secondary | ICD-10-CM | POA: Insufficient documentation

## 2018-08-18 LAB — GLUCOSE, CAPILLARY: Glucose-Capillary: 147 mg/dL — ABNORMAL HIGH (ref 70–99)

## 2018-08-18 MED ORDER — FLUDEOXYGLUCOSE F - 18 (FDG) INJECTION
15.5400 | Freq: Once | INTRAVENOUS | Status: AC | PRN
  Administered 2018-08-18: 15.54 via INTRAVENOUS

## 2018-08-21 ENCOUNTER — Other Ambulatory Visit: Payer: Self-pay | Admitting: Internal Medicine

## 2018-08-21 ENCOUNTER — Telehealth: Payer: Self-pay

## 2018-08-21 ENCOUNTER — Inpatient Hospital Stay: Payer: No Typology Code available for payment source

## 2018-08-21 NOTE — Patient Instructions (Signed)

## 2018-08-21 NOTE — Telephone Encounter (Signed)
Spoke with patient on telephone, and he is in agreement to do chemo education via [place] on Wednesday at 3:00 pm.  Information sent.

## 2018-08-22 ENCOUNTER — Other Ambulatory Visit: Payer: No Typology Code available for payment source

## 2018-08-22 ENCOUNTER — Telehealth: Payer: Self-pay | Admitting: Internal Medicine

## 2018-08-22 ENCOUNTER — Other Ambulatory Visit: Payer: Self-pay | Admitting: Internal Medicine

## 2018-08-22 NOTE — Progress Notes (Signed)
START ON PATHWAY REGIMEN - Gastroesophageal     A cycle is every 14 days:     Oxaliplatin      Leucovorin      Fluorouracil      Fluorouracil   **Always confirm dose/schedule in your pharmacy ordering system**  Patient Characteristics: Distant Metastases (cM1/pM1) / Locally Recurrent Disease, Adenocarcinoma - Esophageal, GE Junction, and Gastric, First Line, HER2 Negative / Unknown Histology: Adenocarcinoma Disease Classification: GE Junction Therapeutic Status: Distant Metastases (No Additional Staging) Line of Therapy: First Line HER2 Status: Awaiting Test Results Intent of Therapy: Non-Curative / Palliative Intent, Discussed with Patient 

## 2018-08-22 NOTE — Telephone Encounter (Signed)
Called pt for appt pre-screen but got no answer. Left vm msg about screening and new guidelines about mask req, no visitors, screening questions they will be asked, and fever checks °

## 2018-08-23 ENCOUNTER — Inpatient Hospital Stay: Payer: No Typology Code available for payment source

## 2018-08-23 ENCOUNTER — Other Ambulatory Visit: Payer: Self-pay

## 2018-08-23 ENCOUNTER — Encounter: Payer: Self-pay | Admitting: Radiation Oncology

## 2018-08-23 ENCOUNTER — Ambulatory Visit
Admission: RE | Admit: 2018-08-23 | Discharge: 2018-08-23 | Disposition: A | Discharge status: Home / Self Care | Payer: No Typology Code available for payment source | Source: Ambulatory Visit | Attending: Radiation Oncology | Admitting: Radiation Oncology

## 2018-08-23 ENCOUNTER — Inpatient Hospital Stay (HOSPITAL_BASED_OUTPATIENT_CLINIC_OR_DEPARTMENT_OTHER): Payer: No Typology Code available for payment source | Admitting: Internal Medicine

## 2018-08-23 VITALS — BP 135/85 | HR 73 | Temp 97.7°F | Wt 309.1 lb

## 2018-08-23 DIAGNOSIS — C772 Secondary and unspecified malignant neoplasm of intra-abdominal lymph nodes: Secondary | ICD-10-CM | POA: Diagnosis not present

## 2018-08-23 DIAGNOSIS — R1310 Dysphagia, unspecified: Secondary | ICD-10-CM | POA: Diagnosis not present

## 2018-08-23 DIAGNOSIS — Z79899 Other long term (current) drug therapy: Secondary | ICD-10-CM

## 2018-08-23 DIAGNOSIS — E119 Type 2 diabetes mellitus without complications: Secondary | ICD-10-CM | POA: Diagnosis not present

## 2018-08-23 DIAGNOSIS — B1920 Unspecified viral hepatitis C without hepatic coma: Secondary | ICD-10-CM | POA: Diagnosis not present

## 2018-08-23 DIAGNOSIS — C155 Malignant neoplasm of lower third of esophagus: Secondary | ICD-10-CM | POA: Diagnosis not present

## 2018-08-23 DIAGNOSIS — E7800 Pure hypercholesterolemia, unspecified: Secondary | ICD-10-CM | POA: Diagnosis not present

## 2018-08-23 DIAGNOSIS — I10 Essential (primary) hypertension: Secondary | ICD-10-CM

## 2018-08-23 DIAGNOSIS — G4730 Sleep apnea, unspecified: Secondary | ICD-10-CM

## 2018-08-23 DIAGNOSIS — R634 Abnormal weight loss: Secondary | ICD-10-CM | POA: Insufficient documentation

## 2018-08-23 DIAGNOSIS — K219 Gastro-esophageal reflux disease without esophagitis: Secondary | ICD-10-CM | POA: Diagnosis not present

## 2018-08-23 DIAGNOSIS — E669 Obesity, unspecified: Secondary | ICD-10-CM | POA: Diagnosis not present

## 2018-08-23 MED ORDER — PROCHLORPERAZINE MALEATE 10 MG PO TABS: 10.0000 mg | ORAL_TABLET | Freq: Four times a day (QID) | ORAL | 4 refills | Status: AC | PRN

## 2018-08-23 MED ORDER — LIDOCAINE-PRILOCAINE 2.5-2.5 % EX CREA: 1.0000 "application " | TOPICAL_CREAM | CUTANEOUS | 4 refills | Status: DC | PRN

## 2018-08-23 MED ORDER — ONDANSETRON HCL 8 MG PO TABS: 8.0000 mg | ORAL_TABLET | Freq: Three times a day (TID) | ORAL | 4 refills | Status: DC | PRN

## 2018-08-23 NOTE — Progress Notes (Signed)
Spoke with patient. He will prefer to use his own cell phone today to conference call his wife at his apt with Dr. Rogue Bussing. Pt already had his port a cath placed this week. Site is tender per patient. He will need his nausea meds and emla cream sent to the [facility].

## 2018-08-23 NOTE — Progress Notes (Signed)
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  # June rd week- 5FU pump every 2 days; +RT  # DM- diet controlled;   DIAGNOSIS: Adenocarcinoma esophagus/GE junction  STAGE: IV     ;GOALS: pallaitive  CURRENT/MOST RECENT THERAPY ; 5FU q 2 weeks+RT      Malignant neoplasm of lower third of esophagus ([place])  08/28/2018 -  Chemotherapy   The patient had PALONOSETRON HCL INJECTION 0.25 MG/5ML, 0.25 mg, Intravenous,  Once, 0 of 6 cycles leucovorin 1,072 mg in dextrose 5 % 250 mL infusion, 400 mg/m2, Intravenous,  Once, 0 of 6 cycles oxaliplatin (ELOXATIN) 230 mg in dextrose 5 % 500 mL chemo infusion, 85 mg/m2, Intravenous,  Once, 0 of 6 cycles fluorouracil (ADRUCIL) chemo injection 1,050 mg, 400 mg/m2, Intravenous,  Once, 0 of 6 cycles fluorouracil (ADRUCIL) 6,450 mg in sodium chloride 0.9 % 121 mL chemo infusion, 2,400 mg/m2, Intravenous, 1 Day/Dose, 0 of 6 cycles  for chemotherapy treatment.       HISTORY OF PRESENTING ILLNESS:  Matthew Yang 58 y.o.  male with newly diagnosed esophagus adenocarcinoma is here today with results of his PET  scan/plan of care.  In the interim patient was evaluated by Dr. Donella Stade, radiation oncology.  He also had a Mediport placed.   He continues have difficulty swallowing; pain with swallowing.  Positive for regurgitation.   Review of Systems  Constitutional: Positive for weight loss. Negative for chills, diaphoresis, fever and malaise/fatigue.  HENT: Negative for nosebleeds and sore throat.   Eyes: Negative for double vision.  Respiratory: Negative for cough, hemoptysis, sputum production, shortness of breath and wheezing.   Cardiovascular: Negative for chest pain, palpitations, orthopnea and leg swelling.  Gastrointestinal: Positive for vomiting. Negative for abdominal pain, blood in stool, constipation, diarrhea, heartburn, melena and nausea.       Regurgitation.  Genitourinary: Negative for dysuria, frequency and urgency.  Musculoskeletal: Negative for back pain and joint pain.  Skin: Negative.  Negative for itching and rash.  Neurological: Negative for dizziness, tingling, focal weakness, weakness and headaches.  Endo/Heme/Allergies: Does not bruise/bleed easily.  Psychiatric/Behavioral: Negative for depression. The patient is not nervous/anxious and does not have insomnia.      MEDICAL HISTORY:  Past Medical History:  Diagnosis Date  . Diabetes mellitus without complication ([place])   . Hepatitis    HEPATITIS C  . Hypercholesteremia   . Hypertension   . Morbid obesity ([place])   . Positive hepatitis C antibody test   . Sleep apnea   . Tubular adenoma of colon     SURGICAL HISTORY: Past Surgical History:  Procedure Laterality Date  . CHOLECYSTECTOMY    . COLONOSCOPY    . COLONOSCOPY WITH PROPOFOL N/A 05/04/2016   Procedure: COLONOSCOPY WITH PROPOFOL;  Surgeon: Lollie Sails, MD;  Location: [facility] ENDOSCOPY;  Service: Endoscopy;  Laterality: N/A;  . ESOPHAGOGASTRODUODENOSCOPY (EGD) WITH PROPOFOL N/A 08/04/2018   Procedure: ESOPHAGOGASTRODUODENOSCOPY (EGD) WITH PROPOFOL;   Surgeon: Lollie Sails, MD;  Location: [facility] ENDOSCOPY;  Service: Endoscopy;  Laterality: N/A;  . PORTA CATH INSERTION N/A 08/17/2018   Procedure: PORTA CATH INSERTION;  Surgeon: Algernon Huxley, MD;  Location: [facility] CV LAB;  Service: Cardiovascular;  Laterality: N/A;    SOCIAL HISTORY: Social History   Socioeconomic History  . Marital status: Married    Spouse name: Not on file  . Number of children: Not on file  . Years of education: Not on file  . Highest education level: Not on file  Occupational History  . Not on file  Social Needs  . Financial resource strain: Not on file  . Food insecurity    Worry: Not on file    Inability: Not on file  . Transportation needs    Medical: Not on file    Non-medical: Not on file  Tobacco Use  . Smoking status: Never Smoker  . Smokeless tobacco: Never Used  Substance and Sexual Activity  . Alcohol use: Not Currently  . Drug use: No  . Sexual activity: Not on file  Lifestyle  . Physical activity    Days per week: Not on file    Minutes per session: Not on file  . Stress: Not on file  Relationships  . Social Herbalist on phone: Not on file    Gets together: Not on file    Attends religious service: Not on file    Active member of club or organization: Not on file    Attends meetings of clubs or organizations: Not on file    Relationship status: Not on file  . Intimate partner violence    Fear of current or ex partner: Not on file    Emotionally abused: Not on file    Physically abused: Not on file    Forced sexual activity: Not on file  Other Topics Concern  . 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     FAMILY HISTORY: Family History  Family history unknown: Yes    ALLERGIES:  has No Known Allergies.  MEDICATIONS:  Current Outpatient Medications  Medication Sig Dispense Refill  . atorvastatin (LIPITOR) 20 MG tablet Take 20  mg by mouth daily.    Marland Kitchen lisinopril (PRINIVIL,ZESTRIL) 20 MG tablet Take 20 mg by mouth daily.    . Multiple Vitamin (MULTIVITAMIN WITH MINERALS) TABS tablet Take 1 tablet by mouth daily.    . Omega-3 Fatty Acids (FISH OIL) 1000 MG CAPS Take 1,000 mg by mouth daily.     . pantoprazole (PROTONIX) 40 MG tablet Take 40 mg by mouth daily.    . Turmeric 500 MG CAPS Take 500 mg by mouth daily.    Marland Kitchen lidocaine-prilocaine (EMLA) cream Apply 1 application topically as needed. 30 g 4  . ondansetron (ZOFRAN) 8 MG tablet Take 1 tablet (8 mg total) by mouth every 8 (eight) hours as needed for nausea or vomiting. 20 tablet 4  . prochlorperazine (COMPAZINE) 10 MG tablet Take 1 tablet (10 mg total) by mouth every 6 (six) hours as needed for nausea or vomiting. 30 tablet 4   No current facility-administered medications for this visit.       Marland Kitchen  PHYSICAL EXAMINATION:  ECOG PERFORMANCE STATUS: 1 - Symptomatic but completely ambulatory  There were no vitals filed for this visit. There were no vitals filed for this visit.  Physical Exam  Constitutional: He is oriented to person, place, and time and well-developed, well-nourished, and in no distress.  HENT:  Head: Normocephalic and atraumatic.  Mouth/Throat: Oropharynx is clear and moist. No oropharyngeal exudate.  Eyes: Pupils are equal, round, and reactive to light.  Neck: Normal range of motion. Neck supple.  Cardiovascular: Normal rate and regular rhythm.  Pulmonary/Chest: Effort normal and breath sounds normal. No respiratory distress. He has no wheezes.  Abdominal: Soft. Bowel sounds are normal. He exhibits no distension and no mass. There is no abdominal tenderness. There is no rebound and no guarding.  Musculoskeletal: Normal range of motion.        General: No tenderness or edema.  Neurological: He is alert and oriented to person, place, and time.  Skin: Skin is warm.  Psychiatric: Affect normal.     LABORATORY DATA:  I have reviewed the data  as listed Lab Results  Component Value Date   WBC 9.0 08/15/2018   HGB 13.8 08/15/2018   HCT 40.9 08/15/2018   MCV 89.5 08/15/2018   PLT 211 08/15/2018   Recent Labs    08/15/18 0943  NA 136  K 4.3  CL 104  CO2 24  GLUCOSE 168*  BUN 16  CREATININE 0.83  CALCIUM 9.1  GFRNONAA >60  GFRAA >60  PROT 7.6  ALBUMIN 4.0  AST 26  ALT 25  ALKPHOS 86  BILITOT 1.3*    RADIOGRAPHIC STUDIES: I have personally reviewed the radiological images as listed and agreed with the findings in the report. Ct Chest W Contrast  Result Date: 08/09/2018 CLINICAL DATA:  Difficulty swallowing x2 months, upper abdominal pain. Newly diagnosed esophageal cancer. EXAM: CT CHEST, ABDOMEN, AND PELVIS WITH CONTRAST TECHNIQUE: Multidetector CT imaging of the chest, abdomen and pelvis was performed following the standard protocol during bolus administration of intravenous contrast. CONTRAST:  173m OMNIPAQUE IOHEXOL 300 MG/ML  SOLN COMPARISON:  CT abdomen/pelvis dated 04/17/2010 FINDINGS: CT CHEST FINDINGS Cardiovascular: The heart is normal in size. No pericardial effusion. No evidence of thoracic aortic aneurysm. Mild atherosclerotic calcifications of the aortic arch. Three vessel coronary atherosclerosis. Mediastinum/Nodes: No suspicious mediastinal lymphadenopathy. Specifically, no lower esophageal nodes. Masslike thickening of the GE junction/gastric cardia (series 2/image 53), likely centered in the proximal stomach. Visualized thyroid is unremarkable. Lungs/Pleura: No suspicious pulmonary nodules. No focal consolidation. No pleural effusion or pneumothorax. Musculoskeletal: Degenerative changes of the lower thoracic spine. CT ABDOMEN PELVIS FINDINGS Hepatobiliary: Liver is within normal limits. Status post cholecystectomy. No intrahepatic or extrahepatic ductal dilatation. Pancreas: Within normal limits. Spleen: Within normal limits. Adrenals/Urinary Tract: Adrenal glands are within normal limits. Kidneys are within  normal limits.  No hydronephrosis. Bladder is within normal limits. Stomach/Bowel: Masslike thickening at the GE junction extending to the gastric cardia (series 2/images 53 and 58). On CT, the mass appears to be centered in the stomach rather than the distal esophagus (sagittal image 156). No evidence of bowel obstruction. Normal appendix (series 2/image 98). No colonic wall thickening or mass is evident on CT. Vascular/Lymphatic: No evidence of abdominal aortic aneurysm. Upper abdominal lymphadenopathy, including a 21 mm short axis gastrohepatic node (series 2/image 89) and a 17 mm short axis celiac axis node (series 2/image 62). 17 mm short axis portacaval node (series 2/image 69). Small retroperitoneal nodes, measuring up to 12 mm short axis in  the aortocaval region (series 2/image 76). Reproductive: Prostate is notable for dystrophic calcifications. Other: No abdominopelvic ascites. Musculoskeletal: Degenerative changes of the lumbar spine. IMPRESSION: Masslike thickening at the GE junction extending to the gastric cardia, which appears to be centered in the proximal stomach on CT, likely corresponding to the patient's known gastroesophageal neoplasm. Upper abdominal nodal metastases, including a 2.1 cm short axis gastrohepatic node. Small retroperitoneal nodes measuring up to 12 mm short axis in the aortocaval region. Electronically Signed   By: Julian Hy M.D.   On: 08/09/2018 13:38   Ct Abdomen Pelvis W Contrast  Result Date: 08/09/2018 CLINICAL DATA:  Difficulty swallowing x2 months, upper abdominal pain. Newly diagnosed esophageal cancer. EXAM: CT CHEST, ABDOMEN, AND PELVIS WITH CONTRAST TECHNIQUE: Multidetector CT imaging of the chest, abdomen and pelvis was performed following the standard protocol during bolus administration of intravenous contrast. CONTRAST:  124m OMNIPAQUE IOHEXOL 300 MG/ML  SOLN COMPARISON:  CT abdomen/pelvis dated 04/17/2010 FINDINGS: CT CHEST FINDINGS Cardiovascular: The  heart is normal in size. No pericardial effusion. No evidence of thoracic aortic aneurysm. Mild atherosclerotic calcifications of the aortic arch. Three vessel coronary atherosclerosis. Mediastinum/Nodes: No suspicious mediastinal lymphadenopathy. Specifically, no lower esophageal nodes. Masslike thickening of the GE junction/gastric cardia (series 2/image 53), likely centered in the proximal stomach. Visualized thyroid is unremarkable. Lungs/Pleura: No suspicious pulmonary nodules. No focal consolidation. No pleural effusion or pneumothorax. Musculoskeletal: Degenerative changes of the lower thoracic spine. CT ABDOMEN PELVIS FINDINGS Hepatobiliary: Liver is within normal limits. Status post cholecystectomy. No intrahepatic or extrahepatic ductal dilatation. Pancreas: Within normal limits. Spleen: Within normal limits. Adrenals/Urinary Tract: Adrenal glands are within normal limits. Kidneys are within normal limits.  No hydronephrosis. Bladder is within normal limits. Stomach/Bowel: Masslike thickening at the GE junction extending to the gastric cardia (series 2/images 53 and 58). On CT, the mass appears to be centered in the stomach rather than the distal esophagus (sagittal image 156). No evidence of bowel obstruction. Normal appendix (series 2/image 98). No colonic wall thickening or mass is evident on CT. Vascular/Lymphatic: No evidence of abdominal aortic aneurysm. Upper abdominal lymphadenopathy, including a 21 mm short axis gastrohepatic node (series 2/image 89) and a 17 mm short axis celiac axis node (series 2/image 62). 17 mm short axis portacaval node (series 2/image 69). Small retroperitoneal nodes, measuring up to 12 mm short axis in the aortocaval region (series 2/image 76). Reproductive: Prostate is notable for dystrophic calcifications. Other: No abdominopelvic ascites. Musculoskeletal: Degenerative changes of the lumbar spine. IMPRESSION: Masslike thickening at the GE junction extending to the  gastric cardia, which appears to be centered in the proximal stomach on CT, likely corresponding to the patient's known gastroesophageal neoplasm. Upper abdominal nodal metastases, including a 2.1 cm short axis gastrohepatic node. Small retroperitoneal nodes measuring up to 12 mm short axis in the aortocaval region. Electronically Signed   By: SJulian HyM.D.   On: 08/09/2018 13:38   Nm Pet Image Initial (pi) Skull Base To Thigh  Result Date: 08/18/2018 CLINICAL DATA:  Initial treatment strategy for MALIGNANT NEOPLASM OF LOWER THIRD OF ESOPHAGUS. EXAM: NUCLEAR MEDICINE PET SKULL BASE TO THIGH TECHNIQUE: 15.5 mCi F-18 FDG was injected intravenously. Full-ring PET imaging was performed from the skull base to thigh after the radiotracer. CT data was obtained and used for attenuation correction and anatomic localization. Fasting blood glucose: 147 mg/dl COMPARISON:  08/09/2018 FINDINGS: Mediastinal blood pool activity: SUV max 4.27 Liver activity: SUV max NA There is markedly diminished exam detail secondary  to patient body habitus. NECK: No hypermetabolic lymph nodes in the neck. Incidental CT findings: none CHEST: Large mass involving the distal esophagus and extending into the gastric cardia is again noted as seen on recent CT. This extends from the T9 level 2 T12 level. The esophageal component of this mass has an SUV max of 24.2. No enlarged or hypermetabolic lymph nodes within the chest. Tiny nodule in the posterior right lung base measures 6 mm. This is far too small to characterize by PET-CT. No FDG avid suspicious pulmonary nodules identified. Incidental CT findings: Aortic atherosclerosis. Three vessel coronary artery calcifications. ABDOMEN/PELVIS: No suspicious FDG uptake identified within the liver. No abnormal uptake within the pancreas, spleen or adrenal glands. Hypermetabolic upper abdominal and retroperitoneal adenopathy identified. Index gastrohepatic ligament lymph node measures 1.9 cm  within SUV max of 21 point 7 1. Index porta hepatic lymph node measures 1.5 cm with SUV max of 36.6. Portacaval node measures 2.1 cm with SUV max of 41. Bilateral retroperitoneal FDG avid lymph nodes identified. Index aortocaval node measures 1.1 cm and has an SUV max of 14.6. Index left periaortic node measures 1 cm with SUV max of 11.4. Within the limitations of extensive artifact created by body habitus no definite FDG avid lymph nodes identified below the level of the aortic bifurcation. The mass which extends from the distal esophagus into the proximal stomach is intensely FDG avid. The gastric component of this mass measures approximately 6.4 cm and has an SUV max of 29.5. Incidental CT findings: Cholecystectomy. Mild aortic atherosclerosis. SKELETON: No focal hypermetabolic activity to suggest skeletal metastasis. Incidental CT findings: none IMPRESSION: 1. There is a large mass involving the distal third of the esophagus and proximal stomach. This mass is intensely FDG avid compatible with tumor. 2. Enlarged and hypermetabolic upper abdominal and retroperitoneal lymph nodes compatible with metastatic adenopathy. 3. No definite evidence for solid organ metastases. 4. Tiny nodule in the posterior right lung base is nonspecific and too small to characterize by PET-CT. 5. Aortic Atherosclerosis (ICD10-I70.0). Coronary artery calcifications. Electronically Signed   By: Kerby Moors M.D.   On: 08/18/2018 12:10   Dg Esophagus W Double Cm (hd)  Result Date: 07/27/2018 CLINICAL DATA:  Pt reports dysphagia, solids and liquids getting stuck, regurgitating food x 6 weeks. EXAM: ESOPHOGRAM / BARIUM SWALLOW / BARIUM TABLET STUDY TECHNIQUE: Combined double contrast and single contrast examination performed using effervescent crystals, thick barium liquid, and thin barium liquid. The patient was observed with fluoroscopy swallowing a 13 mm barium sulphate tablet. FLUOROSCOPY TIME:  Fluoroscopy Time:  1 minutes 24  seconds Radiation Exposure Index (if provided by the fluoroscopic device): 26.7 mGy Number of Acquired Spot Images: 0 COMPARISON:  None. FINDINGS: There was normal pharyngeal anatomy and motility. Contrast flowed freely through the esophagus without evidence of a mass. Mild focal narrowing of the distal esophagus just proximal to the gastroesophageal junction with smooth margins most concerning for a stricture measuring approximately 10 mm. The stricture does restrict the passage of a 13 mm barium tablet. There was normal esophageal mucosa without evidence of irregularity or ulceration. Esophageal motility was normal. Severe gastroesophageal reflux extending to the level of the thoracic inlet. No definite hiatal hernia was demonstrated. IMPRESSION: 1. Mild stricture of the distal esophagus just proximal to the esophagogastric junction measuring approximately 10 mm in length. 2. Severe gastroesophageal reflux extending to the level of the thoracic inlet. Electronically Signed   By: Kathreen Devoid   On: 07/27/2018 09:28  ASSESSMENT & PLAN:   Malignant neoplasm of lower third of esophagus ([place]) #Adenocarcinoma [poorly differentiated/question signet ring morphology] lower third of esophagus/GE junction.  June 2020 CT scan shows ~2 cm gastrohepatic lymph nodes; and up to 12 mm retroperitoneal/aortocaval lymph node highly suspicious for distant metastasis.  June 2020 PET scan-bulky lower esophageal/gastric tumor; also retroperitoneal/aortocaval lymph nodes/gastrohepatic lymph nodes.  Given the distant metastatic disease to the lymph nodes/beyond the locoregional-I suspect patient has advanced disease stage IV.  #Discussed with the patient the treatments are most likely palliative and not curative.  #Recommend palliative radiation for local disease control/symptom control; and recommend adding 5-FU x46 hours pump every 2 weeks while getting radiation.  Once radiation is done I would recommend addition of  oxaliplatin.  Await NGS for HER-2/neu testing.  # Difficulty swallowing/dysphagia-secondary to malignant tumor.  Referral to [facility].  # Discussed the potential side effects including but not limited to-increasing fatigue, nausea vomiting, diarrhea, hair loss, sores in the mouth, increase risk of infection and also neuropathy.  Awaiting chemotherapy education this afternoon.  Prescription for antiemetics EMLA cream sent.  #Second opinion offered; declined.  Patient's wife was on the phone throughout our conversation with the patient.  # DISPOSITION: # follow up in 1 week/ Dr.Yu/Rao; cbc/cmp; 5FU pump; pump off in 2 days.  # follow up in 3 weeks-MD; labs- cbc/cmp-5FU pump; pump of in 2 days. - Dr.B  # I reviewed the blood work- with the patient in detail; also reviewed the imaging independently [as summarized above]; and with the patient in detail.    All questions were answered. The patient knows to call the clinic with any problems, questions or concerns.    Cammie Sickle, MD 08/23/2018 12:57 PM

## 2018-08-23 NOTE — Consult Note (Signed)
NEW PATIENT EVALUATION  Name: Matthew Yang  MRN: [id number]  Date:   08/23/2018     DOB: 10/07/1960   This 58 y.o. 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  REFERRING PHYSICIAN: Kirk Ruths, MD  CHIEF COMPLAINT:  Chief Complaint  Patient presents with  . Esophageal Cancer    DIAGNOSIS: The encounter diagnosis was Malignant neoplasm of lower third of esophagus ([place]).   PREVIOUS INVESTIGATIONS:  CT scan and PET CT scan reviewed Clinical notes reviewed Pathology report reviewed Case presented at weekly tumor conference  HPI: Patient is a 58 year old male who was presented with approximately 35-month history of increasing dysphasia with approximate 40 pound weight loss.  He underwent upper endoscopy showing a poorly differentiated adenocarcinoma with focal signet ring cell morphology in the lower third of the esophagus at the GE junction.  The mass was partially obstructing and fungating and extending into the gastric cardia.  On CT scan in June 2020 he was noted to have upper abdominal lymphadenopathy.  PET CT scan confirmed a large mass involving the distal third of the esophagus and proximal stomach intensely avid.  He also had enlarged and hypermetabolic upper abdominal and retroperitoneal lymph nodes compatible with metastatic disease.  No other evidence of distant disease was noted.  He is having some mild dysphasia at this time.  He has been seen by medical oncology.  I been asked to evaluate the patient for palliative radiation therapy to his upper esophagus with concurrent 5-FU chemotherapy.  Patient then will be a candidate for FOLFOX with oxalic platinum.  Patient is also to be referred to [facility] for surgical opinion.  PLANNED TREATMENT REGIMEN: Concurrent chemoradiation  PAST MEDICAL HISTORY:  has a past medical history of  Diabetes mellitus without complication ([place]), Hepatitis, Hypercholesteremia, Hypertension, Morbid obesity ([place]), Positive hepatitis C antibody test, Sleep apnea, and Tubular adenoma of colon.    PAST SURGICAL HISTORY:  Past Surgical History:  Procedure Laterality Date  . CHOLECYSTECTOMY    . COLONOSCOPY    . COLONOSCOPY WITH PROPOFOL N/A 05/04/2016   Procedure: COLONOSCOPY WITH PROPOFOL;  Surgeon: Lollie Sails, MD;  Location: [facility] ENDOSCOPY;  Service: Endoscopy;  Laterality: N/A;  . ESOPHAGOGASTRODUODENOSCOPY (EGD) WITH PROPOFOL N/A 08/04/2018   Procedure: ESOPHAGOGASTRODUODENOSCOPY (EGD) WITH PROPOFOL;  Surgeon: Lollie Sails, MD;  Location: [facility] ENDOSCOPY;  Service: Endoscopy;  Laterality: N/A;  . PORTA CATH INSERTION N/A 08/17/2018   Procedure: PORTA CATH INSERTION;  Surgeon: Algernon Huxley, MD;  Location: [facility];  Service: Cardiovascular;  Laterality: N/A;    FAMILY HISTORY: Family history is unknown by patient.  SOCIAL HISTORY:  reports that he has never smoked. He has never used smokeless tobacco. He reports previous alcohol use. He reports that he does not use drugs.  ALLERGIES: Patient has no known allergies.  MEDICATIONS:  Current Outpatient Medications  Medication Sig Dispense Refill  . atorvastatin (LIPITOR) 20 MG tablet Take 20 mg by mouth daily.    Marland Kitchen lidocaine-prilocaine (EMLA) cream Apply 1 application topically as needed. 30 g 4  . lisinopril (PRINIVIL,ZESTRIL) 20 MG tablet Take 20 mg by mouth daily.    . Multiple Vitamin (MULTIVITAMIN WITH MINERALS) TABS tablet Take 1 tablet by mouth daily.    . Omega-3 Fatty Acids (FISH OIL) 1000 MG CAPS Take 1,000 mg by mouth daily.     . ondansetron (  ZOFRAN) 8 MG tablet Take 1 tablet (8 mg total) by mouth every 8 (eight) hours as needed for nausea or vomiting. 20 tablet 4  . pantoprazole (PROTONIX) 40 MG tablet Take 40 mg by mouth daily.    . prochlorperazine (COMPAZINE) 10 MG tablet Take 1 tablet (10 mg total) by  mouth every 6 (six) hours as needed for nausea or vomiting. 30 tablet 4  . Turmeric 500 MG CAPS Take 500 mg by mouth daily.     No current facility-administered medications for this encounter.     ECOG PERFORMANCE STATUS:  1 - Symptomatic but completely ambulatory  REVIEW OF SYSTEMS: Patient denies any weight loss, fatigue, weakness, fever, chills or night sweats. Patient denies any loss of vision, blurred vision. Patient denies any ringing  of the ears or hearing loss. No irregular heartbeat. Patient denies heart murmur or history of fainting. Patient denies any chest pain or pain radiating to her upper extremities. Patient denies any shortness of breath, difficulty breathing at night, cough or hemoptysis. Patient denies any swelling in the lower legs. Patient denies any nausea vomiting, vomiting of blood, or coffee ground material in the vomitus. Patient denies any stomach pain. Patient states has had normal bowel movements no significant constipation or diarrhea. Patient denies any dysuria, hematuria or significant nocturia. Patient denies any problems walking, swelling in the joints or loss of balance. Patient denies any skin changes, loss of hair or loss of weight. Patient denies any excessive worrying or anxiety or significant depression. Patient denies any problems with insomnia. Patient denies excessive thirst, polyuria, polydipsia. Patient denies any swollen glands, patient denies easy bruising or easy bleeding. Patient denies any recent infections, allergies or URI. Patient "s visual fields have not changed significantly in recent time.   PHYSICAL EXAM: BP 135/85   Pulse 73   Temp 97.7 F (36.5 C)   Wt (!) 309 lb 1.4 oz (140.2 kg)   BMI 43.11 kg/m  Obese male in NAD.  Well-developed well-nourished patient in NAD. HEENT reveals PERLA, EOMI, discs not visualized.  Oral cavity is clear. No oral mucosal lesions are identified. Neck is clear without evidence of cervical or supraclavicular  adenopathy. Lungs are clear to A&P. Cardiac examination is essentially unremarkable with regular rate and rhythm without murmur rub or thrill. Abdomen is benign with no organomegaly or masses noted. Motor sensory and DTR levels are equal and symmetric in the upper and lower extremities. Cranial nerves II through XII are grossly intact. Proprioception is intact. No peripheral adenopathy or edema is identified. No motor or sensory levels are noted. Crude visual fields are within normal range.  LABORATORY DATA: Pathology report reviewed    RADIOLOGY RESULTS: CT scans and PET CT scan reviewed   IMPRESSION: Stage IV poorly differentiated adenocarcinoma the distal esophagus stage IV by retroperitoneal and abdominal lymph node involvement in 58 year old male  PLAN: At this time I have discussed the case personally with medical oncology.  Patient's wife was also available via by phone during my consultation.  I would plan on delivering 4000 cGy over 4 weeks using IMRT treatment planning and delivery to his a distal esophagus and proximal cardia of the stomach.  I would use PET fusion study for treatment planning.  Risks and benefits of treatment including increased dysphasia alteration of blood counts skin reaction fatigue and possible nausea all were discussed in detail with the patient.  He seems to comprehend my treatment plan well.  I have personally set up and ordered  CT simulation for next week.  Patient will be referred to [facility] for surgical opinion although with his stage IV disease I believe surgical resection may be contraindicated except as a palliative benefit.  Patient comprehends my treatment plan well.  There will be extra effort by both professional staff as well as technical staff to coordinate and manage concurrent chemoradiation and ensuing side effects during his treatments.   I would like to take this opportunity to thank you for allowing me to participate in the care of your  patient.Noreene Filbert, MD

## 2018-08-23 NOTE — Assessment & Plan Note (Addendum)
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  June 2020 CT scan shows ~2 cm gastrohepatic lymph nodes; and up to 12 mm retroperitoneal/aortocaval lymph node highly suspicious for distant metastasis.  June 2020 PET scan-bulky lower esophageal/gastric tumor; also retroperitoneal/aortocaval lymph nodes/gastrohepatic lymph nodes.  Given the distant metastatic disease to the lymph nodes/beyond the locoregional-I suspect patient has advanced disease stage IV.  #Discussed with the patient the treatments are most likely palliative and not curative.  #Recommend palliative radiation for local disease control/symptom control; and recommend adding 5-FU x46 hours pump every 2 weeks while getting radiation.  Once radiation is done I would recommend addition of oxaliplatin.  Await NGS for HER-2/neu testing.  # Difficulty swallowing/dysphagia-secondary to malignant tumor.  Referral to [place].  # Discussed the potential side effects including but not limited to-increasing fatigue, nausea vomiting, diarrhea, hair loss, sores in the mouth, increase risk of infection and also neuropathy.  Awaiting chemotherapy education this afternoon.  Prescription for antiemetics EMLA cream sent.  #Second opinion offered; declined.  Patient's wife was on the phone throughout our conversation with the patient.  # DISPOSITION: # follow up in 1 week/ Dr.Yu/Rao; cbc/cmp; 5FU pump; pump off in 2 days.  # follow up in 3 weeks-MD; labs- cbc/cmp-5FU pump; pump of in 2 days. - Dr.B  # I reviewed the blood work- with the patient in detail; also reviewed the imaging independently [as summarized above]; and with the patient in detail.

## 2018-08-23 NOTE — Progress Notes (Signed)
Here to discuss PET scan results.  Has already seen Dr. Baruch Gouty this morning.

## 2018-08-24 ENCOUNTER — Other Ambulatory Visit: Payer: No Typology Code available for payment source

## 2018-08-24 NOTE — Progress Notes (Signed)
Tumor Board Documentation  Blanche Gallien was presented by Dr Rogue Bussing at our Tumor Board on 08/24/2018, which included representatives from medical oncology, radiation oncology, pathology, radiology, surgical, surgical oncology, navigation, research, internal medicine.  Phi currently presents as a current patient with history of the following treatments: active survellience, surgical intervention(s).  Additionally, we reviewed previous medical and familial history, history of present illness, and recent lab results along with all available histopathologic and imaging studies. The tumor board considered available treatment options and made the following recommendations: Palliative radiation therapy, Chemotherapy XRT with 5 FU followed by FOLFOX plus or minus Herceptin  The following procedures/referrals were also placed: No orders of the defined types were placed in this encounter.   Clinical Trial Status: not discussed   Staging used: AJCC Stage Group  Tumor Board Documentation Today's extended care, comprehensive team conference, Skyy was not present for the discussion and was not examined.   Multidisciplinary Tumor Board is a multidisciplinary case peer review process.  Decisions discussed in the Multidisciplinary Tumor Board reflect the opinions of the specialists present at the conference without having examined the patient.  Ultimately, treatment and diagnostic decisions rest with the primary provider(s) and the patient.

## 2018-08-25 ENCOUNTER — Other Ambulatory Visit: Payer: Self-pay

## 2018-08-28 ENCOUNTER — Ambulatory Visit
Admission: RE | Admit: 2018-08-28 | Discharge: 2018-08-28 | Disposition: A | Discharge status: Home / Self Care | Payer: No Typology Code available for payment source | Source: Ambulatory Visit | Attending: Radiation Oncology | Admitting: Radiation Oncology

## 2018-08-28 ENCOUNTER — Other Ambulatory Visit: Payer: Self-pay

## 2018-08-28 DIAGNOSIS — Z510 Encounter for antineoplastic radiation therapy: Secondary | ICD-10-CM | POA: Insufficient documentation

## 2018-08-28 DIAGNOSIS — C155 Malignant neoplasm of lower third of esophagus: Secondary | ICD-10-CM | POA: Diagnosis not present

## 2018-08-29 DIAGNOSIS — Z510 Encounter for antineoplastic radiation therapy: Secondary | ICD-10-CM | POA: Diagnosis not present

## 2018-08-30 ENCOUNTER — Inpatient Hospital Stay: Payer: No Typology Code available for payment source

## 2018-08-30 ENCOUNTER — Encounter: Payer: Self-pay | Admitting: Oncology

## 2018-08-30 ENCOUNTER — Inpatient Hospital Stay (HOSPITAL_BASED_OUTPATIENT_CLINIC_OR_DEPARTMENT_OTHER): Payer: No Typology Code available for payment source | Admitting: Oncology

## 2018-08-30 ENCOUNTER — Other Ambulatory Visit: Payer: Self-pay

## 2018-08-30 VITALS — BP 99/68 | HR 83 | Temp 98.3°F | Resp 18 | Ht 71.0 in | Wt 306.9 lb

## 2018-08-30 DIAGNOSIS — C155 Malignant neoplasm of lower third of esophagus: Secondary | ICD-10-CM

## 2018-08-30 DIAGNOSIS — K219 Gastro-esophageal reflux disease without esophagitis: Secondary | ICD-10-CM | POA: Diagnosis not present

## 2018-08-30 DIAGNOSIS — R1310 Dysphagia, unspecified: Secondary | ICD-10-CM | POA: Diagnosis not present

## 2018-08-30 DIAGNOSIS — I10 Essential (primary) hypertension: Secondary | ICD-10-CM

## 2018-08-30 DIAGNOSIS — Z9049 Acquired absence of other specified parts of digestive tract: Secondary | ICD-10-CM

## 2018-08-30 DIAGNOSIS — Z95828 Presence of other vascular implants and grafts: Secondary | ICD-10-CM

## 2018-08-30 DIAGNOSIS — Z79899 Other long term (current) drug therapy: Secondary | ICD-10-CM

## 2018-08-30 DIAGNOSIS — G4730 Sleep apnea, unspecified: Secondary | ICD-10-CM

## 2018-08-30 DIAGNOSIS — Z5111 Encounter for antineoplastic chemotherapy: Secondary | ICD-10-CM | POA: Diagnosis not present

## 2018-08-30 DIAGNOSIS — Z7189 Other specified counseling: Secondary | ICD-10-CM

## 2018-08-30 DIAGNOSIS — C772 Secondary and unspecified malignant neoplasm of intra-abdominal lymph nodes: Secondary | ICD-10-CM | POA: Diagnosis not present

## 2018-08-30 DIAGNOSIS — E119 Type 2 diabetes mellitus without complications: Secondary | ICD-10-CM

## 2018-08-30 LAB — COMPREHENSIVE METABOLIC PANEL
ALT: 25 U/L (ref 0–44)
AST: 29 U/L (ref 15–41)
Albumin: 3.9 g/dL (ref 3.5–5.0)
Alkaline Phosphatase: 79 U/L (ref 38–126)
Anion gap: 9 (ref 5–15)
BUN: 14 mg/dL (ref 6–20)
CO2: 24 mmol/L (ref 22–32)
Calcium: 8.9 mg/dL (ref 8.9–10.3)
Chloride: 104 mmol/L (ref 98–111)
Creatinine, Ser: 0.8 mg/dL (ref 0.61–1.24)
GFR calc Af Amer: >60 mL/min (ref >60)
GFR calc non Af Amer: >60 mL/min (ref >60)
Glucose, Bld: 173 mg/dL — ABNORMAL HIGH (ref 70–99)
Potassium: 4.1 mmol/L (ref 3.5–5.1)
Sodium: 137 mmol/L (ref 135–145)
Total Bilirubin: 1.1 mg/dL (ref 0.3–1.2)
Total Protein: 7.1 g/dL (ref 6.5–8.1)

## 2018-08-30 LAB — CBC WITH DIFFERENTIAL/PLATELET
Abs Immature Granulocytes: 0.03 10*3/uL (ref 0.00–0.07)
Basophils Absolute: 0 10*3/uL (ref 0.0–0.1)
Basophils Relative: 0 %
Eosinophils Absolute: 0.2 10*3/uL (ref 0.0–0.5)
Eosinophils Relative: 2 %
HCT: 39.1 % (ref 39.0–52.0)
Hemoglobin: 13.1 g/dL (ref 13.0–17.0)
Immature Granulocytes: 0 %
Lymphocytes Relative: 17 %
Lymphs Abs: 1.4 10*3/uL (ref 0.7–4.0)
MCH: 30.1 pg (ref 26.0–34.0)
MCHC: 33.5 g/dL (ref 30.0–36.0)
MCV: 89.9 fL (ref 80.0–100.0)
Monocytes Absolute: 1.1 10*3/uL — ABNORMAL HIGH (ref 0.1–1.0)
Monocytes Relative: 13 %
Neutro Abs: 5.6 10*3/uL (ref 1.7–7.7)
Neutrophils Relative %: 68 %
Platelets: 197 10*3/uL (ref 150–400)
RBC: 4.35 MIL/uL (ref 4.22–5.81)
RDW: 12 % (ref 11.5–15.5)
WBC: 8.4 10*3/uL (ref 4.0–10.5)
nRBC: 0 % (ref 0.0–0.2)

## 2018-08-30 MED ORDER — DEXTROSE 5 % IV SOLN
Freq: Once | INTRAVENOUS | Status: AC
  Administered 2018-08-30: 11:00:00 via INTRAVENOUS
  Filled 2018-08-30: qty 250

## 2018-08-30 MED ORDER — ONDANSETRON HCL 4 MG/2ML IJ SOLN
8.0000 mg | Freq: Once | INTRAMUSCULAR | Status: AC
  Administered 2018-08-30: 8 mg via INTRAVENOUS
  Filled 2018-08-30: qty 4

## 2018-08-30 MED ORDER — SODIUM CHLORIDE 0.9 % IV SOLN
2400.0000 mg/m2 | INTRAVENOUS | Status: DC
  Administered 2018-08-30: 6450 mg via INTRAVENOUS
  Filled 2018-08-30: qty 100

## 2018-08-30 MED ORDER — SODIUM CHLORIDE 0.9% FLUSH
10.0000 mL | Freq: Once | INTRAVENOUS | Status: AC
  Administered 2018-08-30: 10 mL via INTRAVENOUS
  Filled 2018-08-30: qty 10

## 2018-08-30 NOTE — Progress Notes (Signed)
Pt still working through what he can eat that will not make him vomit the food back up. He also has a lot of sputum that he coughs up

## 2018-08-30 NOTE — Progress Notes (Signed)
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  # June rd week- 5FU pump every 2 days; +RT  # DM- diet controlled;   DIAGNOSIS: Adenocarcinoma esophagus/GE junction  STAGE: IV     ;GOALS: pallaitive  CURRENT/MOST RECENT THERAPY ; 5FU q 2 weeks+RT      Malignant neoplasm of lower third of esophagus ([place])  08/30/2018 -  Chemotherapy   The patient had palonosetron (ALOXI) injection 0.25 mg, 0.25 mg, Intravenous,  Once, 0 of 5 cycles leucovorin 1,072 mg in dextrose 5 % 250 mL infusion, 400 mg/m2 = 1,072 mg, Intravenous,  Once, 0 of 5 cycles oxaliplatin (ELOXATIN) 230 mg in dextrose 5 % 500 mL chemo infusion, 85 mg/m2 = 230 mg, Intravenous,  Once, 0 of 5 cycles fluorouracil (ADRUCIL) chemo injection 1,050 mg, 400 mg/m2 = 1,050 mg, Intravenous,  Once, 0 of 5 cycles fluorouracil  (ADRUCIL) 6,450 mg in sodium chloride 0.9 % 121 mL chemo infusion, 2,400 mg/m2 = 6,450 mg, Intravenous, 1 Day/Dose, 0 of 6 cycles  for chemotherapy treatment.       Interval history-patient has lost 10 pounds in the last 2 weeks.  He is still able to swallow soft mashed food but unable to eat significant solid food.  He is also drinking smoothies made from whey protein.  ECOG PS- 1 Pain scale- 0   Review of systems- Review of Systems  Constitutional: Negative for chills, fever, malaise/fatigue and weight loss.  HENT: Negative for congestion, ear discharge and nosebleeds.   Eyes: Negative for blurred vision.  Respiratory: Negative for cough, hemoptysis, sputum production, shortness of breath and wheezing.   Cardiovascular: Negative for chest pain, palpitations, orthopnea and claudication.  Gastrointestinal: Negative for abdominal pain, blood in stool, constipation, diarrhea, heartburn, melena, nausea and vomiting.       Dysphagia  Genitourinary: Negative for dysuria, flank pain, frequency, hematuria and urgency.  Musculoskeletal: Negative for back pain, joint pain and myalgias.  Skin: Negative for rash.  Neurological: Negative for dizziness, tingling, focal weakness, seizures, weakness and headaches.  Endo/Heme/Allergies: Does not bruise/bleed easily.  Psychiatric/Behavioral: Negative for depression and suicidal ideas. The patient does not have insomnia.       No Known Allergies   Past Medical History:  Diagnosis Date   Diabetes mellitus without complication ([place])    Hepatitis    HEPATITIS C   Hypercholesteremia    Hypertension    Morbid obesity ([facility])    Positive hepatitis C antibody test    Sleep apnea    Tubular adenoma  of colon      Past Surgical History:  Procedure Laterality Date   CHOLECYSTECTOMY     COLONOSCOPY     COLONOSCOPY WITH PROPOFOL N/A 05/04/2016   Procedure: COLONOSCOPY WITH PROPOFOL;  Surgeon: Lollie Sails, MD;  Location: [facility]  ENDOSCOPY;  Service: Endoscopy;  Laterality: N/A;   ESOPHAGOGASTRODUODENOSCOPY (EGD) WITH PROPOFOL N/A 08/04/2018   Procedure: ESOPHAGOGASTRODUODENOSCOPY (EGD) WITH PROPOFOL;  Surgeon: Lollie Sails, MD;  Location: [facility] ENDOSCOPY;  Service: Endoscopy;  Laterality: N/A;   PORTA CATH INSERTION N/A 08/17/2018   Procedure: PORTA CATH INSERTION;  Surgeon: Algernon Huxley, MD;  Location: [facility];  Service: Cardiovascular;  Laterality: N/A;    Social History   Socioeconomic History   Marital status: Married    Spouse name: Not on file   Number of children: Not on file   Years of education: Not on file   Highest education level: Not on file  Occupational History   Not on file  Social Needs   Financial resource strain: Not on file   Food insecurity    Worry: Not on file    Inability: Not on file   Transportation needs    Medical: Not on file    Non-medical: Not on file  Tobacco Use   Smoking status: Never Smoker   Smokeless tobacco: Never Used  Substance and Sexual Activity   Alcohol use: Not Currently   Drug use: No   Sexual activity: Not on file  Lifestyle   Physical activity    Days per week: Not on file    Minutes per session: Not on file   Stress: Not on file  Relationships   Social connections    Talks on phone: Not on file    Gets together: Not on file    Attends religious service: Not on file    Active member of club or organization: Not on file    Attends meetings of clubs or organizations: Not on file    Relationship status: Not on file   Intimate partner violence    Fear of current or ex partner: Not on file    Emotionally abused: Not on file    Physically abused: Not on file    Forced sexual activity: Not on file  Other Topics Concern   Not on file  Social History Narrative   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     Family History  Family history unknown: Yes     Current Outpatient  Medications:    atorvastatin (LIPITOR) 20 MG tablet, Take 20 mg by mouth daily., Disp: , Rfl:    lidocaine-prilocaine (EMLA) cream, Apply 1 application topically as needed., Disp: 30 g, Rfl: 4   lisinopril (PRINIVIL,ZESTRIL) 20 MG tablet, Take 20 mg by mouth daily., Disp: , Rfl:    Multiple Vitamin (MULTIVITAMIN WITH MINERALS) TABS tablet, Take 1 tablet by mouth daily., Disp: , Rfl:    Omega-3 Fatty Acids (FISH OIL) 1000 MG CAPS, Take 1,000 mg by mouth daily. , Disp: , Rfl:    ondansetron (ZOFRAN) 8 MG tablet, Take 1 tablet (8 mg total) by mouth every 8 (eight) hours as needed for nausea or vomiting., Disp: 20 tablet, Rfl: 4   pantoprazole (PROTONIX) 40 MG tablet, Take 40 mg by mouth daily., Disp: , Rfl:    prochlorperazine (COMPAZINE) 10 MG tablet, Take 1 tablet (10 mg total) by mouth every 6 (six) hours as needed for nausea or vomiting.,  Disp: 30 tablet, Rfl: 4   Turmeric 500 MG CAPS, Take 500 mg by mouth daily., Disp: , Rfl:   Physical exam:  Vitals:   08/30/18 1001  BP: 99/68  Pulse: 83  Resp: 18  Temp: 98.3 F (36.8 C)  TempSrc: Tympanic  Weight: (!) 306 lb 14.4 oz (139.2 kg)  Height: 5\' 11"  (1.803 m)   Physical Exam Constitutional:      Appearance: He is obese.  HENT:     Head: Normocephalic and atraumatic.  Eyes:     Pupils: Pupils are equal, round, and reactive to light.  Neck:     Musculoskeletal: Normal range of motion.  Cardiovascular:     Rate and Rhythm: Normal rate and regular rhythm.     Heart sounds: Normal heart sounds.     Comments: Right chest wall port in place Pulmonary:     Effort: Pulmonary effort is normal.     Breath sounds: Normal breath sounds.  Abdominal:     General: Bowel sounds are normal.     Palpations: Abdomen is soft.  Skin:    General: Skin is warm and dry.  Neurological:     Mental Status: He is alert and oriented to person, place, and time.      CMP Latest Ref Rng & Units 08/30/2018  Glucose 70 - 99 mg/dL 173(H)  BUN 6 -  20 mg/dL 14  Creatinine 0.61 - 1.24 mg/dL 0.80  Sodium 135 - 145 mmol/L 137  Potassium 3.5 - 5.1 mmol/L 4.1  Chloride 98 - 111 mmol/L 104  CO2 22 - 32 mmol/L 24  Calcium 8.9 - 10.3 mg/dL 8.9  Total Protein 6.5 - 8.1 g/dL 7.1  Total Bilirubin 0.3 - 1.2 mg/dL 1.1  Alkaline Phos 38 - 126 U/L 79  AST 15 - 41 U/L 29  ALT 0 - 44 U/L 25   CBC Latest Ref Rng & Units 08/30/2018  WBC 4.0 - 10.5 K/uL 8.4  Hemoglobin 13.0 - 17.0 g/dL 13.1  Hematocrit 39.0 - 52.0 % 39.1  Platelets 150 - 400 K/uL 197    No images are attached to the encounter.  Ct Chest W Contrast  Result Date: 08/09/2018 CLINICAL DATA:  Difficulty swallowing x2 months, upper abdominal pain. Newly diagnosed esophageal cancer. EXAM: CT CHEST, ABDOMEN, AND PELVIS WITH CONTRAST TECHNIQUE: Multidetector CT imaging of the chest, abdomen and pelvis was performed following the standard protocol during bolus administration of intravenous contrast. CONTRAST:  170mL OMNIPAQUE IOHEXOL 300 MG/ML  SOLN COMPARISON:  CT abdomen/pelvis dated 04/17/2010 FINDINGS: CT CHEST FINDINGS Cardiovascular: The heart is normal in size. No pericardial effusion. No evidence of thoracic aortic aneurysm. Mild atherosclerotic calcifications of the aortic arch. Three vessel coronary atherosclerosis. Mediastinum/Nodes: No suspicious mediastinal lymphadenopathy. Specifically, no lower esophageal nodes. Masslike thickening of the GE junction/gastric cardia (series 2/image 53), likely centered in the proximal stomach. Visualized thyroid is unremarkable. Lungs/Pleura: No suspicious pulmonary nodules. No focal consolidation. No pleural effusion or pneumothorax. Musculoskeletal: Degenerative changes of the lower thoracic spine. CT ABDOMEN PELVIS FINDINGS Hepatobiliary: Liver is within normal limits. Status post cholecystectomy. No intrahepatic or extrahepatic ductal dilatation. Pancreas: Within normal limits. Spleen: Within normal limits. Adrenals/Urinary Tract: Adrenal glands are  within normal limits. Kidneys are within normal limits.  No hydronephrosis. Bladder is within normal limits. Stomach/Bowel: Masslike thickening at the GE junction extending to the gastric cardia (series 2/images 53 and 58). On CT, the mass appears to be centered in the stomach rather than the distal esophagus (  sagittal image 156). No evidence of bowel obstruction. Normal appendix (series 2/image 98). No colonic wall thickening or mass is evident on CT. Vascular/Lymphatic: No evidence of abdominal aortic aneurysm. Upper abdominal lymphadenopathy, including a 21 mm short axis gastrohepatic node (series 2/image 89) and a 17 mm short axis celiac axis node (series 2/image 62). 17 mm short axis portacaval node (series 2/image 69). Small retroperitoneal nodes, measuring up to 12 mm short axis in the aortocaval region (series 2/image 76). Reproductive: Prostate is notable for dystrophic calcifications. Other: No abdominopelvic ascites. Musculoskeletal: Degenerative changes of the lumbar spine. IMPRESSION: Masslike thickening at the GE junction extending to the gastric cardia, which appears to be centered in the proximal stomach on CT, likely corresponding to the patient's known gastroesophageal neoplasm. Upper abdominal nodal metastases, including a 2.1 cm short axis gastrohepatic node. Small retroperitoneal nodes measuring up to 12 mm short axis in the aortocaval region. Electronically Signed   By: Julian Hy M.D.   On: 08/09/2018 13:38   Ct Abdomen Pelvis W Contrast  Result Date: 08/09/2018 CLINICAL DATA:  Difficulty swallowing x2 months, upper abdominal pain. Newly diagnosed esophageal cancer. EXAM: CT CHEST, ABDOMEN, AND PELVIS WITH CONTRAST TECHNIQUE: Multidetector CT imaging of the chest, abdomen and pelvis was performed following the standard protocol during bolus administration of intravenous contrast. CONTRAST:  145mL OMNIPAQUE IOHEXOL 300 MG/ML  SOLN COMPARISON:  CT abdomen/pelvis dated 04/17/2010  FINDINGS: CT CHEST FINDINGS Cardiovascular: The heart is normal in size. No pericardial effusion. No evidence of thoracic aortic aneurysm. Mild atherosclerotic calcifications of the aortic arch. Three vessel coronary atherosclerosis. Mediastinum/Nodes: No suspicious mediastinal lymphadenopathy. Specifically, no lower esophageal nodes. Masslike thickening of the GE junction/gastric cardia (series 2/image 53), likely centered in the proximal stomach. Visualized thyroid is unremarkable. Lungs/Pleura: No suspicious pulmonary nodules. No focal consolidation. No pleural effusion or pneumothorax. Musculoskeletal: Degenerative changes of the lower thoracic spine. CT ABDOMEN PELVIS FINDINGS Hepatobiliary: Liver is within normal limits. Status post cholecystectomy. No intrahepatic or extrahepatic ductal dilatation. Pancreas: Within normal limits. Spleen: Within normal limits. Adrenals/Urinary Tract: Adrenal glands are within normal limits. Kidneys are within normal limits.  No hydronephrosis. Bladder is within normal limits. Stomach/Bowel: Masslike thickening at the GE junction extending to the gastric cardia (series 2/images 53 and 58). On CT, the mass appears to be centered in the stomach rather than the distal esophagus (sagittal image 156). No evidence of bowel obstruction. Normal appendix (series 2/image 98). No colonic wall thickening or mass is evident on CT. Vascular/Lymphatic: No evidence of abdominal aortic aneurysm. Upper abdominal lymphadenopathy, including a 21 mm short axis gastrohepatic node (series 2/image 89) and a 17 mm short axis celiac axis node (series 2/image 62). 17 mm short axis portacaval node (series 2/image 69). Small retroperitoneal nodes, measuring up to 12 mm short axis in the aortocaval region (series 2/image 76). Reproductive: Prostate is notable for dystrophic calcifications. Other: No abdominopelvic ascites. Musculoskeletal: Degenerative changes of the lumbar spine. IMPRESSION: Masslike  thickening at the GE junction extending to the gastric cardia, which appears to be centered in the proximal stomach on CT, likely corresponding to the patient's known gastroesophageal neoplasm. Upper abdominal nodal metastases, including a 2.1 cm short axis gastrohepatic node. Small retroperitoneal nodes measuring up to 12 mm short axis in the aortocaval region. Electronically Signed   By: Julian Hy M.D.   On: 08/09/2018 13:38   Nm Pet Image Initial (pi) Skull Base To Thigh  Result Date: 08/18/2018 CLINICAL DATA:  Initial treatment strategy for MALIGNANT  NEOPLASM OF LOWER THIRD OF ESOPHAGUS. EXAM: NUCLEAR MEDICINE PET SKULL BASE TO THIGH TECHNIQUE: 15.5 mCi F-18 FDG was injected intravenously. Full-ring PET imaging was performed from the skull base to thigh after the radiotracer. CT data was obtained and used for attenuation correction and anatomic localization. Fasting blood glucose: 147 mg/dl COMPARISON:  08/09/2018 FINDINGS: Mediastinal blood pool activity: SUV max 4.27 Liver activity: SUV max NA There is markedly diminished exam detail secondary to patient body habitus. NECK: No hypermetabolic lymph nodes in the neck. Incidental CT findings: none CHEST: Large mass involving the distal esophagus and extending into the gastric cardia is again noted as seen on recent CT. This extends from the T9 level 2 T12 level. The esophageal component of this mass has an SUV max of 24.2. No enlarged or hypermetabolic lymph nodes within the chest. Tiny nodule in the posterior right lung base measures 6 mm. This is far too small to characterize by PET-CT. No FDG avid suspicious pulmonary nodules identified. Incidental CT findings: Aortic atherosclerosis. Three vessel coronary artery calcifications. ABDOMEN/PELVIS: No suspicious FDG uptake identified within the liver. No abnormal uptake within the pancreas, spleen or adrenal glands. Hypermetabolic upper abdominal and retroperitoneal adenopathy identified. Index  gastrohepatic ligament lymph node measures 1.9 cm within SUV max of 21 point 7 1. Index porta hepatic lymph node measures 1.5 cm with SUV max of 36.6. Portacaval node measures 2.1 cm with SUV max of 41. Bilateral retroperitoneal FDG avid lymph nodes identified. Index aortocaval node measures 1.1 cm and has an SUV max of 14.6. Index left periaortic node measures 1 cm with SUV max of 11.4. Within the limitations of extensive artifact created by body habitus no definite FDG avid lymph nodes identified below the level of the aortic bifurcation. The mass which extends from the distal esophagus into the proximal stomach is intensely FDG avid. The gastric component of this mass measures approximately 6.4 cm and has an SUV max of 29.5. Incidental CT findings: Cholecystectomy. Mild aortic atherosclerosis. SKELETON: No focal hypermetabolic activity to suggest skeletal metastasis. Incidental CT findings: none IMPRESSION: 1. There is a large mass involving the distal third of the esophagus and proximal stomach. This mass is intensely FDG avid compatible with tumor. 2. Enlarged and hypermetabolic upper abdominal and retroperitoneal lymph nodes compatible with metastatic adenopathy. 3. No definite evidence for solid organ metastases. 4. Tiny nodule in the posterior right lung base is nonspecific and too small to characterize by PET-CT. 5. Aortic Atherosclerosis (ICD10-I70.0). Coronary artery calcifications. Electronically Signed   By: Kerby Moors M.D.   On: 08/18/2018 12:10     Assessment and plan- Patient is a 58 y.o. male with stage IV metastatic GE junction adenocarcinoma with retroperitoneal adenopathy.  He is currently undergoing palliative radiation treatment.  He is here for on treatment assessment prior to cycle 1 of infusional 5-FU chemotherapy  Counts okay to proceed with infusional 5-FU chemotherapy today.  He will come back on day 3 for pump disconnect.  Again discussed risks and benefits of chemotherapy  including all but not limited to nausea, vomiting, fatigue, low blood counts, risk of infection and hospitalization.  Patient understands and agrees to proceed as planned.  He is getting 4 weeks of palliative radiation treatment  Return to clinic in 2 weeks.  Port labs: CBC with differential, CMP.  See Dr. Rogue Bussing for cycle 2 of infusional 5-FU chemotherapy  Patient has not heard back from nutrition yet and we will follow-up on that today.  Patient encouraged to  continue eating softer mashed foods.  He will call us if he has any untoward side effects from chemotherapy or feels dehydrated and is in need of IV fluids over the next 2 weeks.   Visit Diagnosis 1. Encounter for antineoplastic chemotherapy   2. Malignant neoplasm of lower third of esophagus ([facility])      Dr. Randa Evens, MD, MPH [facility] [phone] 08/30/2018 10:30 AM

## 2018-08-31 ENCOUNTER — Encounter: Payer: Self-pay | Admitting: Internal Medicine

## 2018-09-01 ENCOUNTER — Other Ambulatory Visit: Payer: Self-pay

## 2018-09-01 ENCOUNTER — Inpatient Hospital Stay: Payer: No Typology Code available for payment source

## 2018-09-01 VITALS — BP 110/68 | HR 76 | Resp 18

## 2018-09-01 DIAGNOSIS — C155 Malignant neoplasm of lower third of esophagus: Secondary | ICD-10-CM

## 2018-09-01 DIAGNOSIS — Z5111 Encounter for antineoplastic chemotherapy: Secondary | ICD-10-CM | POA: Diagnosis not present

## 2018-09-01 DIAGNOSIS — Z7189 Other specified counseling: Secondary | ICD-10-CM

## 2018-09-01 MED ORDER — HEPARIN SOD (PORK) LOCK FLUSH 100 UNIT/ML IV SOLN
500.0000 [IU] | Freq: Once | INTRAVENOUS | Status: AC | PRN
  Administered 2018-09-01: 12:00:00 500 [IU]

## 2018-09-04 ENCOUNTER — Inpatient Hospital Stay: Payer: No Typology Code available for payment source

## 2018-09-04 NOTE — Progress Notes (Signed)
Nutrition Assessment   Reason for Assessment:   Esophageal cancer   ASSESSMENT:   58 year old male with new diagnosis of metastatic GE junction adenocarcinoma.  Patient has started chemotherapy (5 fu) and planning on starting radiation on 7/1.  Past medical history of DM, hep c, hypercholesterolemia, HTN  Spoke with patient via phone for nutrition assessment.  Patient reports that last week was a rough week and has issues with foods getting stuck in mid chest area then vomiting them back up and increased saliva production after episode.  Reports over the weekend was better able to tolerate foods.  Typically eats cereal for breakfast then mid am diet soda and 2 eggs with cheese and pepperoni stick.  Lunch maybe a sandwich but had soup this weekend and tolerated well. Supper is usually meat and vegetables.  Reports that he is drinking smoothie made with whey protein powder about 1 time per day (made with greek yogurt, fruits, sometimes spinach and protein powder).  Has not tried boost/ensure shakes.      Nutrition Focused Physical Exam: deferred   Medications: MVI, protonix, omega 3, compazine, zofran   Labs: glucose 173   Anthropometrics:   Height: 71 inches Weight: 306 lb 14.4 oz on 6/24 UBW: Reports has lost about 40 lbs.  Noted 325 lb on 5/29 BMI: 42  6% weight loss in the last month, significant   Estimated Energy Needs  Kcals: 2300-2700 calories Protein: 117-156 g Fluid: 2.7 L   NUTRITION DIAGNOSIS: Inadequate oral intake related to cancer and cancer related treatment side effects as evidenced by 6% weight loss in the last month   INTERVENTION:  Discussed changing consistency of foods (ie chopping, blending, grinding foods) adding gravies, sauces, etc.   Encouraged small frequent meals/snacks. Taking small bites.   Encouraged smoothie times 2 per day Contact information given to patient   MONITORING, EVALUATION, GOAL: Patient will consume adequate calories and  protein to maintain lean muscle mass   Next Visit: Monday, July 13th after radiation  Rhiley Tarver B. Zenia Resides, [place], [place] Registered Dietitian [phone] (pager)

## 2018-09-05 ENCOUNTER — Other Ambulatory Visit: Payer: Self-pay

## 2018-09-05 ENCOUNTER — Ambulatory Visit
Admission: RE | Admit: 2018-09-05 | Discharge: 2018-09-05 | Disposition: A | Discharge status: Home / Self Care | Payer: No Typology Code available for payment source | Source: Ambulatory Visit | Attending: Radiation Oncology | Admitting: Radiation Oncology

## 2018-09-06 ENCOUNTER — Other Ambulatory Visit: Payer: Self-pay

## 2018-09-06 ENCOUNTER — Ambulatory Visit
Admission: RE | Admit: 2018-09-06 | Discharge: 2018-09-06 | Disposition: A | Discharge status: Home / Self Care | Payer: No Typology Code available for payment source | Source: Ambulatory Visit | Attending: Radiation Oncology | Admitting: Radiation Oncology

## 2018-09-06 DIAGNOSIS — I10 Essential (primary) hypertension: Secondary | ICD-10-CM | POA: Diagnosis not present

## 2018-09-06 DIAGNOSIS — B1920 Unspecified viral hepatitis C without hepatic coma: Secondary | ICD-10-CM | POA: Insufficient documentation

## 2018-09-06 DIAGNOSIS — Z79899 Other long term (current) drug therapy: Secondary | ICD-10-CM | POA: Insufficient documentation

## 2018-09-06 DIAGNOSIS — E7800 Pure hypercholesterolemia, unspecified: Secondary | ICD-10-CM | POA: Diagnosis not present

## 2018-09-06 DIAGNOSIS — Z5111 Encounter for antineoplastic chemotherapy: Secondary | ICD-10-CM | POA: Diagnosis not present

## 2018-09-06 DIAGNOSIS — C155 Malignant neoplasm of lower third of esophagus: Secondary | ICD-10-CM | POA: Diagnosis not present

## 2018-09-06 DIAGNOSIS — E119 Type 2 diabetes mellitus without complications: Secondary | ICD-10-CM | POA: Insufficient documentation

## 2018-09-06 DIAGNOSIS — Z510 Encounter for antineoplastic radiation therapy: Secondary | ICD-10-CM | POA: Insufficient documentation

## 2018-09-06 DIAGNOSIS — G4730 Sleep apnea, unspecified: Secondary | ICD-10-CM | POA: Diagnosis not present

## 2018-09-07 ENCOUNTER — Ambulatory Visit
Admission: RE | Admit: 2018-09-07 | Discharge: 2018-09-07 | Disposition: A | Discharge status: Home / Self Care | Payer: No Typology Code available for payment source | Source: Ambulatory Visit | Attending: Radiation Oncology | Admitting: Radiation Oncology

## 2018-09-07 ENCOUNTER — Other Ambulatory Visit: Payer: Self-pay

## 2018-09-07 DIAGNOSIS — Z5111 Encounter for antineoplastic chemotherapy: Secondary | ICD-10-CM | POA: Diagnosis not present

## 2018-09-11 ENCOUNTER — Ambulatory Visit
Admission: RE | Admit: 2018-09-11 | Discharge: 2018-09-11 | Disposition: A | Discharge status: Home / Self Care | Payer: No Typology Code available for payment source | Source: Ambulatory Visit | Attending: Radiation Oncology | Admitting: Radiation Oncology

## 2018-09-11 ENCOUNTER — Other Ambulatory Visit: Payer: Self-pay

## 2018-09-11 DIAGNOSIS — Z5111 Encounter for antineoplastic chemotherapy: Secondary | ICD-10-CM | POA: Diagnosis not present

## 2018-09-12 ENCOUNTER — Other Ambulatory Visit: Payer: Self-pay

## 2018-09-12 ENCOUNTER — Ambulatory Visit
Admission: RE | Admit: 2018-09-12 | Discharge: 2018-09-12 | Disposition: A | Discharge status: Home / Self Care | Payer: No Typology Code available for payment source | Source: Ambulatory Visit | Attending: Radiation Oncology | Admitting: Radiation Oncology

## 2018-09-12 DIAGNOSIS — Z5111 Encounter for antineoplastic chemotherapy: Secondary | ICD-10-CM | POA: Diagnosis not present

## 2018-09-13 ENCOUNTER — Inpatient Hospital Stay: Payer: No Typology Code available for payment source

## 2018-09-13 ENCOUNTER — Inpatient Hospital Stay (HOSPITAL_BASED_OUTPATIENT_CLINIC_OR_DEPARTMENT_OTHER): Payer: No Typology Code available for payment source | Admitting: Internal Medicine

## 2018-09-13 ENCOUNTER — Other Ambulatory Visit: Payer: Self-pay

## 2018-09-13 ENCOUNTER — Inpatient Hospital Stay: Payer: No Typology Code available for payment source | Attending: Internal Medicine

## 2018-09-13 ENCOUNTER — Ambulatory Visit
Admission: RE | Admit: 2018-09-13 | Discharge: 2018-09-13 | Disposition: A | Discharge status: Home / Self Care | Payer: No Typology Code available for payment source | Source: Ambulatory Visit | Attending: Radiation Oncology | Admitting: Radiation Oncology

## 2018-09-13 DIAGNOSIS — C155 Malignant neoplasm of lower third of esophagus: Secondary | ICD-10-CM

## 2018-09-13 DIAGNOSIS — Z79899 Other long term (current) drug therapy: Secondary | ICD-10-CM

## 2018-09-13 DIAGNOSIS — E7800 Pure hypercholesterolemia, unspecified: Secondary | ICD-10-CM

## 2018-09-13 DIAGNOSIS — C160 Malignant neoplasm of cardia: Secondary | ICD-10-CM

## 2018-09-13 DIAGNOSIS — Z7189 Other specified counseling: Secondary | ICD-10-CM

## 2018-09-13 DIAGNOSIS — Z95828 Presence of other vascular implants and grafts: Secondary | ICD-10-CM

## 2018-09-13 DIAGNOSIS — R197 Diarrhea, unspecified: Secondary | ICD-10-CM | POA: Insufficient documentation

## 2018-09-13 DIAGNOSIS — I10 Essential (primary) hypertension: Secondary | ICD-10-CM | POA: Diagnosis not present

## 2018-09-13 DIAGNOSIS — E119 Type 2 diabetes mellitus without complications: Secondary | ICD-10-CM

## 2018-09-13 DIAGNOSIS — Z5111 Encounter for antineoplastic chemotherapy: Secondary | ICD-10-CM | POA: Diagnosis not present

## 2018-09-13 LAB — CBC WITH DIFFERENTIAL/PLATELET
Abs Immature Granulocytes: 0.02 10*3/uL (ref 0.00–0.07)
Basophils Absolute: 0 10*3/uL (ref 0.0–0.1)
Basophils Relative: 0 %
Eosinophils Absolute: 0.2 10*3/uL (ref 0.0–0.5)
Eosinophils Relative: 3 %
HCT: 38.6 % — ABNORMAL LOW (ref 39.0–52.0)
Hemoglobin: 13 g/dL (ref 13.0–17.0)
Immature Granulocytes: 0 %
Lymphocytes Relative: 14 %
Lymphs Abs: 0.8 10*3/uL (ref 0.7–4.0)
MCH: 30.3 pg (ref 26.0–34.0)
MCHC: 33.7 g/dL (ref 30.0–36.0)
MCV: 90 fL (ref 80.0–100.0)
Monocytes Absolute: 0.8 10*3/uL (ref 0.1–1.0)
Monocytes Relative: 13 %
Neutro Abs: 4 10*3/uL (ref 1.7–7.7)
Neutrophils Relative %: 70 %
Platelets: 211 10*3/uL (ref 150–400)
RBC: 4.29 MIL/uL (ref 4.22–5.81)
RDW: 12.6 % (ref 11.5–15.5)
WBC: 5.8 10*3/uL (ref 4.0–10.5)
nRBC: 0 % (ref 0.0–0.2)

## 2018-09-13 LAB — COMPREHENSIVE METABOLIC PANEL
ALT: 24 U/L (ref 0–44)
AST: 23 U/L (ref 15–41)
Albumin: 4 g/dL (ref 3.5–5.0)
Alkaline Phosphatase: 80 U/L (ref 38–126)
Anion gap: 7 (ref 5–15)
BUN: 15 mg/dL (ref 6–20)
CO2: 25 mmol/L (ref 22–32)
Calcium: 9 mg/dL (ref 8.9–10.3)
Chloride: 107 mmol/L (ref 98–111)
Creatinine, Ser: 0.72 mg/dL (ref 0.61–1.24)
GFR calc Af Amer: >60 mL/min (ref >60)
GFR calc non Af Amer: >60 mL/min (ref >60)
Glucose, Bld: 148 mg/dL — ABNORMAL HIGH (ref 70–99)
Potassium: 4.1 mmol/L (ref 3.5–5.1)
Sodium: 139 mmol/L (ref 135–145)
Total Bilirubin: 1.3 mg/dL — ABNORMAL HIGH (ref 0.3–1.2)
Total Protein: 7 g/dL (ref 6.5–8.1)

## 2018-09-13 MED ORDER — ONDANSETRON HCL 4 MG/2ML IJ SOLN: 4.0000 mg | Freq: Once | INTRAMUSCULAR | Status: DC

## 2018-09-13 MED ORDER — LEUCOVORIN CALCIUM INJECTION 100 MG
20.0000 mg/m2 | Freq: Once | INTRAMUSCULAR | Status: AC
  Administered 2018-09-13: 54 mg via INTRAVENOUS
  Filled 2018-09-13: qty 2.7

## 2018-09-13 MED ORDER — ONDANSETRON HCL 4 MG/2ML IJ SOLN
8.0000 mg | Freq: Once | INTRAMUSCULAR | Status: AC
  Administered 2018-09-13: 8 mg via INTRAVENOUS
  Filled 2018-09-13: qty 4

## 2018-09-13 MED ORDER — SODIUM CHLORIDE 0.9 % IV SOLN
Freq: Once | INTRAVENOUS | Status: AC
  Administered 2018-09-13: 10:00:00 via INTRAVENOUS
  Filled 2018-09-13: qty 250

## 2018-09-13 MED ORDER — SODIUM CHLORIDE 0.9% FLUSH
10.0000 mL | Freq: Once | INTRAVENOUS | Status: AC
  Administered 2018-09-13: 09:00:00 10 mL via INTRAVENOUS
  Filled 2018-09-13: qty 10

## 2018-09-13 MED ORDER — LEUCOVORIN CALCIUM INJECTION 350 MG: 400.0000 mg/m2 | Freq: Once | INTRAVENOUS | Status: DC

## 2018-09-13 MED ORDER — SODIUM CHLORIDE 0.9 % IV SOLN
2400.0000 mg/m2 | INTRAVENOUS | Status: DC
  Administered 2018-09-13: 6450 mg via INTRAVENOUS
  Filled 2018-09-13: qty 100

## 2018-09-13 NOTE — Progress Notes (Signed)
Patient had his first shingles vaccine before diagnosis and received a call from the pharmacy yesterday to come for the 2nd vaccine.  Should he wait before getting vaccine?  Needs to discuss the next treatment date, he will be on vaccine on 09/27/18 (2 weeks from today).  Would it be OK for him to take selenium supplement?  Appetite has improved with no problem of food getting stuck.

## 2018-09-13 NOTE — Progress Notes (Signed)
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   DIAGNOSIS: Adenocarcinoma esophagus/GE junction  STAGE: IV     ;GOALS: pallaitive  CURRENT/MOST RECENT THERAPY ; 5FU q 2 weeks+RT      Malignant neoplasm of lower third of esophagus ([place])  08/30/2018 -  Chemotherapy   The patient had palonosetron (ALOXI) injection 0.25 mg, 0.25 mg, Intravenous,  Once, 0 of 3 cycles leucovorin 1,072 mg in dextrose 5 % 250 mL infusion, 400 mg/m2 = 1,072 mg, Intravenous,  Once, 1 of 5 cycles oxaliplatin (ELOXATIN) 230 mg in dextrose 5 % 500 mL chemo infusion, 85 mg/m2 = 230 mg, Intravenous,  Once, 0 of 4 cycles leucovorin injection 54 mg, 20 mg/m2 = 54 mg (100 % of original dose 20 mg/m2), Intravenous,  Once, 1 of 5 cycles Dose modification: 20 mg/m2 (original dose 20 mg/m2, Cycle 2, Reason: Other (see comments), Comment: iv push) fluorouracil (ADRUCIL) 6,450 mg in sodium chloride 0.9 % 121 mL chemo infusion, 2,400  mg/m2 = 6,450 mg, Intravenous, 1 Day/Dose, 2 of 6 cycles Administration: 6,450 mg (08/30/2018)  for chemotherapy treatment.       HISTORY OF PRESENTING ILLNESS:  Matthew Yang 58 y.o.  male stage IV adenocarcinoma esophagus currently on 5-FU continuous infusion x48 hours; radiation is here for follow-up.  Patient admits to mild improvement of his swallowing.  Denies any significant weight loss.  He notes to have 1-2 loose stools a day.  No sores in the mouth.  No nausea no vomiting.  Overall he feels improved.  Review of Systems  Constitutional: Positive for weight loss. Negative for chills, diaphoresis, fever and malaise/fatigue.  HENT: Negative for nosebleeds and sore throat.   Eyes: Negative for double vision.  Respiratory: Negative for cough, hemoptysis, sputum production, shortness of breath and wheezing.   Cardiovascular: Negative for chest pain, palpitations, orthopnea and leg swelling.  Gastrointestinal: Negative for abdominal pain, blood in stool, constipation, diarrhea, heartburn, melena and nausea.  Genitourinary: Negative for dysuria, frequency and urgency.  Musculoskeletal: Negative for back pain and joint pain.  Skin: Negative.  Negative for itching and rash.  Neurological: Negative for dizziness, tingling, focal weakness, weakness and headaches.  Endo/Heme/Allergies: Does not bruise/bleed easily.  Psychiatric/Behavioral: Negative for depression. The patient is not nervous/anxious and does not have insomnia.      MEDICAL HISTORY:  Past Medical History:  Diagnosis Date  . Diabetes mellitus without complication ([place])   . Esophagus cancer ([place])   . Hepatitis    HEPATITIS C  . Hypercholesteremia   . Hypertension   . Morbid obesity ([place])   .  Positive hepatitis C antibody test   . Sleep apnea   . Tubular adenoma of colon    polyp    SURGICAL HISTORY: Past Surgical History:  Procedure Laterality Date  . CHOLECYSTECTOMY    . COLONOSCOPY    . COLONOSCOPY WITH  PROPOFOL N/A 05/04/2016   Procedure: COLONOSCOPY WITH PROPOFOL;  Surgeon: Lollie Sails, MD;  Location: [facility] ENDOSCOPY;  Service: Endoscopy;  Laterality: N/A;  . ESOPHAGOGASTRODUODENOSCOPY (EGD) WITH PROPOFOL N/A 08/04/2018   Procedure: ESOPHAGOGASTRODUODENOSCOPY (EGD) WITH PROPOFOL;  Surgeon: Lollie Sails, MD;  Location: [facility] ENDOSCOPY;  Service: Endoscopy;  Laterality: N/A;  . PORTA CATH INSERTION N/A 08/17/2018   Procedure: PORTA CATH INSERTION;  Surgeon: Algernon Huxley, MD;  Location: [facility] CV LAB;  Service: Cardiovascular;  Laterality: N/A;    SOCIAL HISTORY: Social History   Socioeconomic History  . Marital status: Married    Spouse name: Not on file  . Number of children: Not on file  . Years of education: Not on file  . Highest education level: Not on file  Occupational History  . Not on file  Social Needs  . Financial resource strain: Not on file  . Food insecurity    Worry: Not on file    Inability: Not on file  . Transportation needs    Medical: Not on file    Non-medical: Not on file  Tobacco Use  . Smoking status: Never Smoker  . Smokeless tobacco: Never Used  Substance and Sexual Activity  . Alcohol use: Not Currently  . Drug use: No  . Sexual activity: Not on file  Lifestyle  . Physical activity    Days per week: Not on file    Minutes per session: Not on file  . Stress: Not on file  Relationships  . Social Herbalist on phone: Not on file    Gets together: Not on file    Attends religious service: Not on file    Active member of club or organization: Not on file    Attends meetings of clubs or organizations: Not on file    Relationship status: Not on file  . Intimate partner violence    Fear of current or ex partner: Not on file    Emotionally abused: Not on file    Physically abused: Not on file    Forced sexual activity: Not on file  Other Topics Concern  . 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     FAMILY HISTORY: Family History  Family history unknown: Yes    ALLERGIES:  has No Known Allergies.  MEDICATIONS:  Current Outpatient Medications  Medication Sig Dispense Refill  . atorvastatin (LIPITOR) 20 MG tablet Take 20 mg by mouth daily.    Marland Kitchen lidocaine-prilocaine (EMLA) cream Apply 1 application topically as needed. 30 g 4  . lisinopril (PRINIVIL,ZESTRIL) 20 MG tablet Take 20 mg by mouth daily.    . Multiple Vitamin (MULTIVITAMIN WITH MINERALS) TABS tablet Take 1 tablet by mouth daily.    . Omega-3 Fatty Acids (FISH OIL) 1000 MG CAPS Take 1,000 mg by mouth daily.     . ondansetron (ZOFRAN) 8 MG tablet Take 1 tablet (8 mg total) by mouth every 8 (eight) hours as needed for nausea or vomiting. 20 tablet 4  . pantoprazole (PROTONIX) 40 MG tablet Take 40 mg by mouth daily.    . prochlorperazine (COMPAZINE)  10 MG tablet Take 1 tablet (10 mg total) by mouth every 6 (six) hours as needed for nausea or vomiting. 30 tablet 4  . Turmeric 500 MG CAPS Take 500 mg by mouth daily.     No current facility-administered medications for this visit.    Facility-Administered Medications Ordered in Other Visits  Medication Dose Route Frequency Provider Last Rate Last Dose  . fluorouracil (ADRUCIL) 6,450 mg in sodium chloride 0.9 % 121 mL chemo infusion  2,400 mg/m2 (Treatment Plan Recorded) Intravenous 1 day or 1 dose Charlaine Dalton R, MD      . leucovorin injection 54 mg  20 mg/m2 (Treatment Plan Recorded) Intravenous Once Charlaine Dalton R, MD      . ondansetron (ZOFRAN) injection 8 mg  8 mg Intravenous Once Charlaine Dalton R, MD          .  PHYSICAL EXAMINATION: ECOG PERFORMANCE STATUS: 1 - Symptomatic but completely ambulatory  Vitals:   09/13/18 0939  BP: 126/84  Pulse: 76  Resp: 20  Temp: (!) 97 F (36.1 C)   Filed Weights   09/13/18 0939  Weight: (!) 306 lb (138.8 kg)    Physical Exam  Constitutional: He  is oriented to person, place, and time and well-developed, well-nourished, and in no distress.  HENT:  Head: Normocephalic and atraumatic.  Mouth/Throat: Oropharynx is clear and moist. No oropharyngeal exudate.  Eyes: Pupils are equal, round, and reactive to light.  Neck: Normal range of motion. Neck supple.  Cardiovascular: Normal rate and regular rhythm.  Pulmonary/Chest: Effort normal and breath sounds normal. No respiratory distress. He has no wheezes.  Abdominal: Soft. Bowel sounds are normal. He exhibits no distension and no mass. There is no abdominal tenderness. There is no rebound and no guarding.  Musculoskeletal: Normal range of motion.        General: No tenderness or edema.  Neurological: He is alert and oriented to person, place, and time.  Skin: Skin is warm.  Psychiatric: Affect normal.     LABORATORY DATA:  I have reviewed the data as listed Lab Results  Component Value Date   WBC 5.8 09/13/2018   HGB 13.0 09/13/2018   HCT 38.6 (L) 09/13/2018   MCV 90.0 09/13/2018   PLT 211 09/13/2018   Recent Labs    08/15/18 0943 08/30/18 0924 09/13/18 0915  NA 136 137 139  K 4.3 4.1 4.1  CL 104 104 107  CO2 '24 24 25  ' GLUCOSE 168* 173* 148*  BUN '16 14 15  ' CREATININE 0.83 0.80 0.72  CALCIUM 9.1 8.9 9.0  GFRNONAA >60 >60 >60  GFRAA >60 >60 >60  PROT 7.6 7.1 7.0  ALBUMIN 4.0 3.9 4.0  AST '26 29 23  ' ALT '25 25 24  ' ALKPHOS 86 79 80  BILITOT 1.3* 1.1 1.3*    RADIOGRAPHIC STUDIES: I have personally reviewed the radiological images as listed and agreed with the findings in the report. Nm Pet Image Initial (pi) Skull Base To Thigh  Result Date: 08/18/2018 CLINICAL DATA:  Initial treatment strategy for MALIGNANT NEOPLASM OF LOWER THIRD OF ESOPHAGUS. EXAM: NUCLEAR MEDICINE PET SKULL BASE TO THIGH TECHNIQUE: 15.5 mCi F-18 FDG was injected intravenously. Full-ring PET imaging was performed from the skull base to thigh after the radiotracer. CT data was obtained and used for  attenuation correction and anatomic localization. Fasting blood glucose: 147 mg/dl COMPARISON:  08/09/2018 FINDINGS: Mediastinal blood pool activity: SUV max 4.27 Liver activity: SUV max NA There is markedly diminished exam  detail secondary to patient body habitus. NECK: No hypermetabolic lymph nodes in the neck. Incidental CT findings: none CHEST: Large mass involving the distal esophagus and extending into the gastric cardia is again noted as seen on recent CT. This extends from the T9 level 2 T12 level. The esophageal component of this mass has an SUV max of 24.2. No enlarged or hypermetabolic lymph nodes within the chest. Tiny nodule in the posterior right lung base measures 6 mm. This is far too small to characterize by PET-CT. No FDG avid suspicious pulmonary nodules identified. Incidental CT findings: Aortic atherosclerosis. Three vessel coronary artery calcifications. ABDOMEN/PELVIS: No suspicious FDG uptake identified within the liver. No abnormal uptake within the pancreas, spleen or adrenal glands. Hypermetabolic upper abdominal and retroperitoneal adenopathy identified. Index gastrohepatic ligament lymph node measures 1.9 cm within SUV max of 21 point 7 1. Index porta hepatic lymph node measures 1.5 cm with SUV max of 36.6. Portacaval node measures 2.1 cm with SUV max of 41. Bilateral retroperitoneal FDG avid lymph nodes identified. Index aortocaval node measures 1.1 cm and has an SUV max of 14.6. Index left periaortic node measures 1 cm with SUV max of 11.4. Within the limitations of extensive artifact created by body habitus no definite FDG avid lymph nodes identified below the level of the aortic bifurcation. The mass which extends from the distal esophagus into the proximal stomach is intensely FDG avid. The gastric component of this mass measures approximately 6.4 cm and has an SUV max of 29.5. Incidental CT findings: Cholecystectomy. Mild aortic atherosclerosis. SKELETON: No focal hypermetabolic  activity to suggest skeletal metastasis. Incidental CT findings: none IMPRESSION: 1. There is a large mass involving the distal third of the esophagus and proximal stomach. This mass is intensely FDG avid compatible with tumor. 2. Enlarged and hypermetabolic upper abdominal and retroperitoneal lymph nodes compatible with metastatic adenopathy. 3. No definite evidence for solid organ metastases. 4. Tiny nodule in the posterior right lung base is nonspecific and too small to characterize by PET-CT. 5. Aortic Atherosclerosis (ICD10-I70.0). Coronary artery calcifications. Electronically Signed   By: Kerby Moors M.D.   On: 08/18/2018 12:10    ASSESSMENT & PLAN:   Malignant neoplasm of lower third of esophagus ([place]) #Adenocarcinoma [poorly differentiated/question signet ring morphology] lower third of esophagus/GE junction.June 2020 PET scan-bulky lower esophageal/gastric tumor; also retroperitoneal/aortocaval lymph nodes/gastrohepatic lymph nodes.   Stage IV. Currently,  5 FU CIV x 48 hours; with RT [until aug 8th, 2020].  # proceed with cycle #2 of 5FU CIV. Labs today reviewed;  acceptable for treatment today.   # Difficulty swallowing/dysphagia-secondary to malignant tumor- slight improvement noted.   # diarrhea- G-1- monitor   # ok with dead/shingrix vaccine.   # declined my offer to talk his wife.   # DISPOSITION: # Treatment today # follow up on 27th [pts pref/vaca]-MD; labs- cbc/cmp-5FU pump; pump of in 2 days. - Dr.B    All questions were answered. The patient knows to call the clinic with any problems, questions or concerns.    Cammie Sickle, MD 09/13/2018 10:29 AM

## 2018-09-13 NOTE — Assessment & Plan Note (Addendum)
#  Adenocarcinoma [poorly differentiated/question signet ring morphology] lower third of esophagus/GE junction.June 2020 PET scan-bulky lower esophageal/gastric tumor; also retroperitoneal/aortocaval lymph nodes/gastrohepatic lymph nodes.   Stage IV. Currently,  5 FU CIV x 48 hours; with RT [until aug 8th, 2020].  # proceed with cycle #2 of 5FU CIV. Labs today reviewed;  acceptable for treatment today.   # Difficulty swallowing/dysphagia-secondary to malignant tumor- slight improvement noted.   # diarrhea- G-1- monitor   # ok with dead/shingrix vaccine.   # declined my offer to talk his wife.   # DISPOSITION: # Treatment today # follow up on 27th [pts pref/vaca]-MD; labs- cbc/cmp-5FU pump; pump of in 2 days. - Dr.B

## 2018-09-14 ENCOUNTER — Ambulatory Visit
Admission: RE | Admit: 2018-09-14 | Discharge: 2018-09-14 | Disposition: A | Discharge status: Home / Self Care | Payer: No Typology Code available for payment source | Source: Ambulatory Visit | Attending: Radiation Oncology | Admitting: Radiation Oncology

## 2018-09-14 ENCOUNTER — Other Ambulatory Visit: Payer: Self-pay

## 2018-09-14 DIAGNOSIS — Z5111 Encounter for antineoplastic chemotherapy: Secondary | ICD-10-CM | POA: Diagnosis not present

## 2018-09-15 ENCOUNTER — Other Ambulatory Visit: Payer: Self-pay

## 2018-09-15 ENCOUNTER — Ambulatory Visit
Admission: RE | Admit: 2018-09-15 | Discharge: 2018-09-15 | Disposition: A | Discharge status: Home / Self Care | Payer: No Typology Code available for payment source | Source: Ambulatory Visit | Attending: Radiation Oncology | Admitting: Radiation Oncology

## 2018-09-15 ENCOUNTER — Inpatient Hospital Stay: Payer: No Typology Code available for payment source

## 2018-09-15 VITALS — BP 113/74 | HR 66 | Temp 97.2°F | Resp 20

## 2018-09-15 DIAGNOSIS — Z7189 Other specified counseling: Secondary | ICD-10-CM

## 2018-09-15 DIAGNOSIS — C155 Malignant neoplasm of lower third of esophagus: Secondary | ICD-10-CM

## 2018-09-15 DIAGNOSIS — Z5111 Encounter for antineoplastic chemotherapy: Secondary | ICD-10-CM | POA: Diagnosis not present

## 2018-09-15 MED ORDER — HEPARIN SOD (PORK) LOCK FLUSH 100 UNIT/ML IV SOLN
500.0000 [IU] | Freq: Once | INTRAVENOUS | Status: AC | PRN
  Administered 2018-09-15: 500 [IU]
  Filled 2018-09-15: qty 5

## 2018-09-15 MED ORDER — SODIUM CHLORIDE 0.9% FLUSH
10.0000 mL | INTRAVENOUS | Status: DC | PRN
  Administered 2018-09-15: 10 mL
  Filled 2018-09-15: qty 10

## 2018-09-18 ENCOUNTER — Inpatient Hospital Stay: Payer: No Typology Code available for payment source

## 2018-09-18 ENCOUNTER — Ambulatory Visit
Admission: RE | Admit: 2018-09-18 | Discharge: 2018-09-18 | Disposition: A | Discharge status: Home / Self Care | Payer: No Typology Code available for payment source | Source: Ambulatory Visit | Attending: Radiation Oncology | Admitting: Radiation Oncology

## 2018-09-18 ENCOUNTER — Other Ambulatory Visit: Payer: Self-pay

## 2018-09-18 DIAGNOSIS — Z5111 Encounter for antineoplastic chemotherapy: Secondary | ICD-10-CM | POA: Diagnosis not present

## 2018-09-18 NOTE — Progress Notes (Signed)
Nutrition Follow-up:  Patient with metastatic GE junction adenocarcinoma.  Patient is receiving chemotherapy and radiation therapy.    Spoke with patient following radiation today.  Patient reports that he is eating better. Feels like esophagus has opened up some.  Still is not eating breads or steak.  Reports has been able to eat pork chop times 2 and left over green beans last night for dinner.  Ate eggs and cornbeef hash for breakfast yesterday.  Reports ate cereal this am and will drink smoothie (whey protein powder, almond milk, greek yogurt and fruit/veggies) when he gets home.  Also has been able to tolerate bacon and nacho chips.      Medications: reviewed  Labs: reviewed  Anthropometrics:   Stable weight of 306 lb (7/8) same as on 6/24.     NUTRITION DIAGNOSIS: Inadequate oral intake improving   INTERVENTION:  Reviewed foods high in protein and encouraged at every meal.  Encouraged small frequent meals Encouraged patient to continue smoothie for added nutrition.   Contact information provided    MONITORING, EVALUATION, GOAL: Patient will consume adequate calories and protein to maintain lean muscle mass   NEXT VISIT: July 27 during infusion  Miia Blanks B. Zenia Resides, [place], [place] Registered Dietitian [phone] (pager)

## 2018-09-19 ENCOUNTER — Ambulatory Visit
Admission: RE | Admit: 2018-09-19 | Discharge: 2018-09-19 | Disposition: A | Discharge status: Home / Self Care | Payer: No Typology Code available for payment source | Source: Ambulatory Visit | Attending: Radiation Oncology | Admitting: Radiation Oncology

## 2018-09-19 ENCOUNTER — Other Ambulatory Visit: Payer: Self-pay

## 2018-09-19 DIAGNOSIS — Z5111 Encounter for antineoplastic chemotherapy: Secondary | ICD-10-CM | POA: Diagnosis not present

## 2018-09-20 ENCOUNTER — Ambulatory Visit
Admission: RE | Admit: 2018-09-20 | Discharge: 2018-09-20 | Disposition: A | Discharge status: Home / Self Care | Payer: No Typology Code available for payment source | Source: Ambulatory Visit | Attending: Radiation Oncology | Admitting: Radiation Oncology

## 2018-09-20 ENCOUNTER — Other Ambulatory Visit: Payer: Self-pay

## 2018-09-20 DIAGNOSIS — Z5111 Encounter for antineoplastic chemotherapy: Secondary | ICD-10-CM | POA: Diagnosis not present

## 2018-09-21 ENCOUNTER — Ambulatory Visit
Admission: RE | Admit: 2018-09-21 | Discharge: 2018-09-21 | Disposition: A | Discharge status: Home / Self Care | Payer: No Typology Code available for payment source | Source: Ambulatory Visit | Attending: Radiation Oncology | Admitting: Radiation Oncology

## 2018-09-21 ENCOUNTER — Other Ambulatory Visit: Payer: Self-pay

## 2018-09-21 DIAGNOSIS — Z5111 Encounter for antineoplastic chemotherapy: Secondary | ICD-10-CM | POA: Diagnosis not present

## 2018-09-22 ENCOUNTER — Ambulatory Visit
Admission: RE | Admit: 2018-09-22 | Discharge: 2018-09-22 | Disposition: A | Discharge status: Home / Self Care | Payer: No Typology Code available for payment source | Source: Ambulatory Visit | Attending: Radiation Oncology | Admitting: Radiation Oncology

## 2018-09-22 ENCOUNTER — Other Ambulatory Visit: Payer: Self-pay

## 2018-09-22 DIAGNOSIS — Z5111 Encounter for antineoplastic chemotherapy: Secondary | ICD-10-CM | POA: Diagnosis not present

## 2018-09-25 ENCOUNTER — Ambulatory Visit
Admission: RE | Admit: 2018-09-25 | Discharge: 2018-09-25 | Disposition: A | Discharge status: Home / Self Care | Payer: No Typology Code available for payment source | Source: Ambulatory Visit | Attending: Radiation Oncology | Admitting: Radiation Oncology

## 2018-09-25 ENCOUNTER — Other Ambulatory Visit: Payer: Self-pay

## 2018-09-25 DIAGNOSIS — Z5111 Encounter for antineoplastic chemotherapy: Secondary | ICD-10-CM | POA: Diagnosis not present

## 2018-09-26 ENCOUNTER — Other Ambulatory Visit: Payer: Self-pay

## 2018-09-26 ENCOUNTER — Ambulatory Visit
Admission: RE | Admit: 2018-09-26 | Discharge: 2018-09-26 | Disposition: A | Discharge status: Home / Self Care | Payer: No Typology Code available for payment source | Source: Ambulatory Visit | Attending: Radiation Oncology | Admitting: Radiation Oncology

## 2018-09-26 DIAGNOSIS — Z5111 Encounter for antineoplastic chemotherapy: Secondary | ICD-10-CM | POA: Diagnosis not present

## 2018-09-27 ENCOUNTER — Other Ambulatory Visit: Payer: Self-pay

## 2018-09-27 ENCOUNTER — Ambulatory Visit
Admission: RE | Admit: 2018-09-27 | Discharge: 2018-09-27 | Disposition: A | Discharge status: Home / Self Care | Payer: No Typology Code available for payment source | Source: Ambulatory Visit | Attending: Radiation Oncology | Admitting: Radiation Oncology

## 2018-09-27 DIAGNOSIS — Z5111 Encounter for antineoplastic chemotherapy: Secondary | ICD-10-CM | POA: Diagnosis not present

## 2018-09-28 ENCOUNTER — Other Ambulatory Visit: Payer: Self-pay

## 2018-09-28 ENCOUNTER — Ambulatory Visit
Admission: RE | Admit: 2018-09-28 | Discharge: 2018-09-28 | Disposition: A | Discharge status: Home / Self Care | Payer: No Typology Code available for payment source | Source: Ambulatory Visit | Attending: Radiation Oncology | Admitting: Radiation Oncology

## 2018-09-28 DIAGNOSIS — Z5111 Encounter for antineoplastic chemotherapy: Secondary | ICD-10-CM | POA: Diagnosis not present

## 2018-09-29 ENCOUNTER — Ambulatory Visit: Payer: No Typology Code available for payment source

## 2018-09-29 ENCOUNTER — Other Ambulatory Visit: Payer: Self-pay

## 2018-10-02 ENCOUNTER — Inpatient Hospital Stay: Payer: No Typology Code available for payment source

## 2018-10-02 ENCOUNTER — Other Ambulatory Visit: Payer: Self-pay

## 2018-10-02 ENCOUNTER — Ambulatory Visit
Admission: RE | Admit: 2018-10-02 | Discharge: 2018-10-02 | Disposition: A | Discharge status: Home / Self Care | Payer: No Typology Code available for payment source | Source: Ambulatory Visit | Attending: Radiation Oncology | Admitting: Radiation Oncology

## 2018-10-02 ENCOUNTER — Inpatient Hospital Stay: Payer: No Typology Code available for payment source | Admitting: Nurse Practitioner

## 2018-10-02 ENCOUNTER — Inpatient Hospital Stay (HOSPITAL_BASED_OUTPATIENT_CLINIC_OR_DEPARTMENT_OTHER): Payer: No Typology Code available for payment source | Admitting: Internal Medicine

## 2018-10-02 DIAGNOSIS — C160 Malignant neoplasm of cardia: Secondary | ICD-10-CM | POA: Diagnosis not present

## 2018-10-02 DIAGNOSIS — C155 Malignant neoplasm of lower third of esophagus: Secondary | ICD-10-CM

## 2018-10-02 DIAGNOSIS — E119 Type 2 diabetes mellitus without complications: Secondary | ICD-10-CM | POA: Diagnosis not present

## 2018-10-02 DIAGNOSIS — Z7189 Other specified counseling: Secondary | ICD-10-CM

## 2018-10-02 DIAGNOSIS — I10 Essential (primary) hypertension: Secondary | ICD-10-CM | POA: Diagnosis not present

## 2018-10-02 DIAGNOSIS — Z79899 Other long term (current) drug therapy: Secondary | ICD-10-CM

## 2018-10-02 DIAGNOSIS — R197 Diarrhea, unspecified: Secondary | ICD-10-CM | POA: Diagnosis not present

## 2018-10-02 DIAGNOSIS — Z95828 Presence of other vascular implants and grafts: Secondary | ICD-10-CM

## 2018-10-02 DIAGNOSIS — E7800 Pure hypercholesterolemia, unspecified: Secondary | ICD-10-CM

## 2018-10-02 DIAGNOSIS — Z5111 Encounter for antineoplastic chemotherapy: Secondary | ICD-10-CM | POA: Diagnosis not present

## 2018-10-02 LAB — CBC WITH DIFFERENTIAL/PLATELET
Abs Immature Granulocytes: 0.02 10*3/uL (ref 0.00–0.07)
Basophils Absolute: 0 10*3/uL (ref 0.0–0.1)
Basophils Relative: 0 %
Eosinophils Absolute: 0.2 10*3/uL (ref 0.0–0.5)
Eosinophils Relative: 4 %
HCT: 38.4 % — ABNORMAL LOW (ref 39.0–52.0)
Hemoglobin: 13 g/dL (ref 13.0–17.0)
Immature Granulocytes: 0 %
Lymphocytes Relative: 7 %
Lymphs Abs: 0.4 10*3/uL — ABNORMAL LOW (ref 0.7–4.0)
MCH: 30.7 pg (ref 26.0–34.0)
MCHC: 33.9 g/dL (ref 30.0–36.0)
MCV: 90.8 fL (ref 80.0–100.0)
Monocytes Absolute: 0.9 10*3/uL (ref 0.1–1.0)
Monocytes Relative: 16 %
Neutro Abs: 3.7 10*3/uL (ref 1.7–7.7)
Neutrophils Relative %: 73 %
Platelets: 130 10*3/uL — ABNORMAL LOW (ref 150–400)
RBC: 4.23 MIL/uL (ref 4.22–5.81)
RDW: 13.6 % (ref 11.5–15.5)
WBC: 5.2 10*3/uL (ref 4.0–10.5)
nRBC: 0 % (ref 0.0–0.2)

## 2018-10-02 LAB — COMPREHENSIVE METABOLIC PANEL
ALT: 18 U/L (ref 0–44)
AST: 21 U/L (ref 15–41)
Albumin: 3.6 g/dL (ref 3.5–5.0)
Alkaline Phosphatase: 84 U/L (ref 38–126)
Anion gap: 7 (ref 5–15)
BUN: 13 mg/dL (ref 6–20)
CO2: 25 mmol/L (ref 22–32)
Calcium: 8.7 mg/dL — ABNORMAL LOW (ref 8.9–10.3)
Chloride: 105 mmol/L (ref 98–111)
Creatinine, Ser: 0.73 mg/dL (ref 0.61–1.24)
GFR calc Af Amer: >60 mL/min (ref >60)
GFR calc non Af Amer: >60 mL/min (ref >60)
Glucose, Bld: 215 mg/dL — ABNORMAL HIGH (ref 70–99)
Potassium: 4.1 mmol/L (ref 3.5–5.1)
Sodium: 137 mmol/L (ref 135–145)
Total Bilirubin: 0.9 mg/dL (ref 0.3–1.2)
Total Protein: 6.7 g/dL (ref 6.5–8.1)

## 2018-10-02 MED ORDER — SODIUM CHLORIDE 0.9 % IV SOLN
2400.0000 mg/m2 | INTRAVENOUS | Status: DC
  Administered 2018-10-02: 12:00:00 6450 mg via INTRAVENOUS
  Filled 2018-10-02: qty 100

## 2018-10-02 MED ORDER — ONDANSETRON HCL 4 MG/2ML IJ SOLN: 4.0000 mg | Freq: Once | INTRAMUSCULAR | Status: DC

## 2018-10-02 MED ORDER — ONDANSETRON HCL 4 MG/2ML IJ SOLN
8.0000 mg | Freq: Once | INTRAMUSCULAR | Status: AC
  Administered 2018-10-02: 10:00:00 8 mg via INTRAVENOUS
  Filled 2018-10-02: qty 4

## 2018-10-02 MED ORDER — SODIUM CHLORIDE 0.9 % IV SOLN
Freq: Once | INTRAVENOUS | Status: AC
  Administered 2018-10-02: 10:00:00 via INTRAVENOUS
  Filled 2018-10-02: qty 250

## 2018-10-02 MED ORDER — LEUCOVORIN CALCIUM INJECTION 100 MG
20.0000 mg/m2 | Freq: Once | INTRAMUSCULAR | Status: AC
  Administered 2018-10-02: 11:00:00 54 mg via INTRAVENOUS
  Filled 2018-10-02: qty 2.7

## 2018-10-02 MED ORDER — LEUCOVORIN CALCIUM INJECTION 350 MG: 400.0000 mg/m2 | Freq: Once | INTRAVENOUS | Status: DC

## 2018-10-02 MED ORDER — SODIUM CHLORIDE 0.9% FLUSH
10.0000 mL | Freq: Once | INTRAVENOUS | Status: AC
  Administered 2018-10-02: 10 mL via INTRAVENOUS
  Filled 2018-10-02: qty 10

## 2018-10-02 NOTE — Progress Notes (Signed)
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   DIAGNOSIS: Adenocarcinoma esophagus/GE junction  STAGE: IV     ;GOALS: pallaitive  CURRENT/MOST RECENT THERAPY ; 5FU q 2 weeks+RT      Malignant neoplasm of lower third of esophagus ([place])  08/30/2018 -  Chemotherapy   The patient had palonosetron (ALOXI) injection 0.25 mg, 0.25 mg, Intravenous,  Once, 0 of 3 cycles leucovorin 1,072 mg in dextrose 5 % 250 mL infusion, 400 mg/m2 = 1,072 mg, Intravenous,  Once, 1 of 5 cycles oxaliplatin (ELOXATIN) 230 mg in dextrose 5 % 500 mL chemo infusion, 85 mg/m2 = 230 mg, Intravenous,  Once, 0 of 3 cycles leucovorin injection 54 mg, 20 mg/m2 = 54 mg (100 % of original dose 20 mg/m2), Intravenous,  Once, 1 of 1 cycle Dose modification: 20 mg/m2 (original dose 20 mg/m2, Cycle 2, Reason: Other (see comments), Comment: iv push) Administration: 54 mg (09/13/2018) fluorouracil (ADRUCIL) 6,450 mg in sodium chloride  0.9 % 121 mL chemo infusion, 2,400 mg/m2 = 6,450 mg, Intravenous, 1 Day/Dose, 2 of 6 cycles Administration: 6,450 mg (08/30/2018), 6,450 mg (09/13/2018)  for chemotherapy treatment.      HISTORY OF PRESENTING ILLNESS:  Matthew Yang 58 y.o.  male stage IV adenocarcinoma esophagus currently on 5-FU continuous infusion x48 hours; radiation is here for follow-up.  Patient was out at the beach with his family.  He states that he had a good time with his family.  He states that his eating has significantly improved.  He was able to eat pizza/bread also.  His appetite is good.  No weight loss.  No loose stools.  No nausea no vomiting.  Review of Systems  Constitutional: Positive for weight loss. Negative for chills, diaphoresis, fever and malaise/fatigue.  HENT: Negative for nosebleeds and sore throat.   Eyes: Negative for double vision.  Respiratory: Negative for cough, hemoptysis, sputum production, shortness of breath and wheezing.   Cardiovascular: Negative for chest pain, palpitations, orthopnea and leg swelling.  Gastrointestinal: Negative for abdominal pain, blood in stool, constipation, diarrhea, heartburn, melena and nausea.  Genitourinary: Negative for dysuria, frequency and urgency.  Musculoskeletal: Negative for back pain and joint pain.  Skin: Negative.  Negative for itching and rash.  Neurological: Negative for dizziness, tingling, focal weakness, weakness and headaches.  Endo/Heme/Allergies: Does not bruise/bleed easily.  Psychiatric/Behavioral: Negative for depression. The patient is not nervous/anxious and does not have insomnia.      MEDICAL HISTORY:  Past Medical History:  Diagnosis Date  . Diabetes mellitus without complication ([place])   . Esophagus cancer ([place])   .  Hepatitis    HEPATITIS C  . Hypercholesteremia   . Hypertension   . Morbid obesity ([place])   . Positive hepatitis C antibody test   . Sleep apnea   . Tubular adenoma of colon    polyp    SURGICAL  HISTORY: Past Surgical History:  Procedure Laterality Date  . CHOLECYSTECTOMY    . COLONOSCOPY    . COLONOSCOPY WITH PROPOFOL N/A 05/04/2016   Procedure: COLONOSCOPY WITH PROPOFOL;  Surgeon: Lollie Sails, MD;  Location[facility] ENDOSCOPY;  Service: Endoscopy;  Laterality: N/A;  . ESOPHAGOGASTRODUODENOSCOPY (EGD) WITH PROPOFOL N/A 08/04/2018   Procedure: ESOPHAGOGASTRODUODENOSCOPY (EGD) WITH PROPOFOL;  Surgeon: Lollie Sails, MD;  Location: [facility] ENDOSCOPY;  Service: Endoscopy;  Laterality: N/A;  . PORTA CATH INSERTION N/A 08/17/2018   Procedure: PORTA CATH INSERTION;  Surgeon: Algernon Huxley, MD;  Location: [facility] CV LAB;  Service: Cardiovascular;  Laterality: N/A;    SOCIAL HISTORY: Social History   Socioeconomic History  . Marital status: Married    Spouse name: Not on file  . Number of children: Not on file  . Years of education: Not on file  . Highest education level: Not on file  Occupational History  . Not on file  Social Needs  . Financial resource strain: Not on file  . Food insecurity    Worry: Not on file    Inability: Not on file  . Transportation needs    Medical: Not on file    Non-medical: Not on file  Tobacco Use  . Smoking status: Never Smoker  . Smokeless tobacco: Never Used  Substance and Sexual Activity  . Alcohol use: Not Currently  . Drug use: No  . Sexual activity: Not on file  Lifestyle  . Physical activity    Days per week: Not on file    Minutes per session: Not on file  . Stress: Not on file  Relationships  . Social Herbalist on phone: Not on file    Gets together: Not on file    Attends religious service: Not on file    Active member of club or organization: Not on file    Attends meetings of clubs or organizations: Not on file    Relationship status: Not on file  . Intimate partner violence    Fear of current or ex partner: Not on file    Emotionally abused: Not on file    Physically abused: Not on file    Forced  sexual activity: Not on file  Other Topics Concern  . 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     FAMILY HISTORY: Family History  Family history unknown: Yes    ALLERGIES:  has No Known Allergies.  MEDICATIONS:  Current Outpatient Medications  Medication Sig Dispense Refill  . atorvastatin (LIPITOR) 20 MG tablet Take 20 mg by mouth daily.    Marland Kitchen lidocaine-prilocaine (EMLA) cream Apply 1 application topically as needed. 30 g 4  . lisinopril (PRINIVIL,ZESTRIL) 20 MG tablet Take 20 mg by mouth daily.    . Multiple Vitamin (MULTIVITAMIN WITH MINERALS) TABS tablet Take 1 tablet by mouth daily.    . Omega-3 Fatty Acids (FISH OIL) 1000 MG CAPS Take 1,000 mg by mouth daily.     . ondansetron (ZOFRAN) 8 MG tablet Take 1 tablet (8 mg total) by mouth every 8 (eight) hours as needed for nausea or vomiting.  20 tablet 4  . pantoprazole (PROTONIX) 40 MG tablet Take 40 mg by mouth daily.    . prochlorperazine (COMPAZINE) 10 MG tablet Take 1 tablet (10 mg total) by mouth every 6 (six) hours as needed for nausea or vomiting. 30 tablet 4  . Turmeric 500 MG CAPS Take 500 mg by mouth daily.     No current facility-administered medications for this visit.       Marland Kitchen  PHYSICAL EXAMINATION: ECOG PERFORMANCE STATUS: 1 - Symptomatic but completely ambulatory  Vitals:   10/02/18 0847  BP: 110/77  Pulse: 62  Resp: 20  Temp: (!) 97 F (36.1 C)   Filed Weights   10/02/18 0847  Weight: (!) 305 lb (138.3 kg)    Physical Exam  Constitutional: He is oriented to person, place, and time and well-developed, well-nourished, and in no distress.  HENT:  Head: Normocephalic and atraumatic.  Mouth/Throat: Oropharynx is clear and moist. No oropharyngeal exudate.  Eyes: Pupils are equal, round, and reactive to light.  Neck: Normal range of motion. Neck supple.  Cardiovascular: Normal rate and regular rhythm.  Pulmonary/Chest: Effort  normal and breath sounds normal. No respiratory distress. He has no wheezes.  Abdominal: Soft. Bowel sounds are normal. He exhibits no distension and no mass. There is no abdominal tenderness. There is no rebound and no guarding.  Musculoskeletal: Normal range of motion.        General: No tenderness or edema.  Neurological: He is alert and oriented to person, place, and time.  Skin: Skin is warm.  Psychiatric: Affect normal.     LABORATORY DATA:  I have reviewed the data as listed Lab Results  Component Value Date   WBC 5.2 10/02/2018   HGB 13.0 10/02/2018   HCT 38.4 (L) 10/02/2018   MCV 90.8 10/02/2018   PLT 130 (L) 10/02/2018   Recent Labs    08/30/18 0924 09/13/18 0915 10/02/18 0843  NA 137 139 137  K 4.1 4.1 4.1  CL 104 107 105  CO2 _0 GLUCOSE 173* 148* 215*  BUN _1 CREATININE 0.80 0.72 0.73  CALCIUM 8.9 9.0 8.7*  GFRNONAA >60 >60 >60  GFRAA >60 >60 >60  PROT 7.1 7.0 6.7  ALBUMIN 3.9 4.0 3.6  AST _2 ALT _3 ALKPHOS 79 80 84  BILITOT 1.1 1.3* 0.9    RADIOGRAPHIC STUDIES: I have personally reviewed the radiological images as listed and agreed with the findings in the report. No results found.  ASSESSMENT & PLAN:   Malignant neoplasm of lower third of esophagus ([place]) #Adenocarcinoma [poorly differentiated/question signet ring morphology] lower third of esophagus/GE junction.June 2020 PET scan-bulky lower esophageal/gastric tumor; also retroperitoneal/aortocaval lymph nodes/gastrohepatic lymph nodes.   Stage IV. Currently,  5 FU CIV x 48 hours; with RT [until aug 4th, 2020].clinically, improved.   # proceed with cycle #3 of 5FU CIV. Labs today reviewed;  acceptable for treatment today.  We will proceed with FOLFOX chemotherapy at next visit.  Discussed that would recommend reimaging after 4-6 cycles.  Discussed treatments are indefinite; unless- progression of disease/side effects; primarily chemo holiday.  # Difficulty  swallowing/dysphagia-secondary to malignant tumor- significant improvement noted.   # "blister" around the port site- no infection noted; question allergy to Tegaderm; monitor for now.   # DISPOSITION: # Treatment today; pump off in 2 days # follow up in 2 weeks- MD; labs- cbc/cmp- FOLFOX; pump of in 2 days. - Dr.B  All questions were answered. The patient knows to call the clinic with any problems, questions or concerns.    Cammie Sickle, MD 10/02/2018 9:47 AM

## 2018-10-02 NOTE — Assessment & Plan Note (Addendum)
#  Adenocarcinoma [poorly differentiated/question signet ring morphology] lower third of esophagus/GE junction.June 2020 PET scan-bulky lower esophageal/gastric tumor; also retroperitoneal/aortocaval lymph nodes/gastrohepatic lymph nodes.   Stage IV. Currently,  5 FU CIV x 48 hours; with RT [until aug 4th, 2020].clinically, improved.   # proceed with cycle #3 of 5FU CIV. Labs today reviewed;  acceptable for treatment today.  We will proceed with FOLFOX chemotherapy at next visit.  Discussed that would recommend reimaging after 4-6 cycles.  Discussed treatments are indefinite; unless- progression of disease/side effects; primarily chemo holiday.  # Difficulty swallowing/dysphagia-secondary to malignant tumor- significant improvement noted.   # "blister" around the port site- no infection noted; question allergy to Tegaderm; monitor for now.   # DISPOSITION: # Treatment today; pump off in 2 days # follow up in 2 weeks- MD; labs- cbc/cmp- FOLFOX; pump of in 2 days. - Dr.B

## 2018-10-02 NOTE — Progress Notes (Signed)
Blood return noted before, during and after Leucovorin push.

## 2018-10-02 NOTE — Progress Notes (Signed)
Nutrition Follow-up:  Patient with metastatic GE junction adenocarcinoma.  Patient is receiving chemotherapy and last radiation treatment due 8/4.  Followed by Dr. Jacinto Reap.  Met with patient during infusion.  Patient reports that he and his family went to the beach this past weekend and was able to tolerate a cheeseburger (with the bun), pizza and seafood.  Patient reports that he took his time but was able to swallow these foods without difficulty.  Enjoyed his time at the beach with family.     Medications: reviewed  Labs: glucose 215  Anthropometrics:   Weight 305 lb stable from 306 lb on 7/8 and 6/24   NUTRITION DIAGNOSIS: Inadequate oral intake improving   INTERVENTION:  Reviewed foods high in protein and encouraged at every meal.  Answered questions regarding cold sensitivity and oxaliplatin with next treatment.     MONITORING, EVALUATION, GOAL: Patient will consume adequate calories and protein to maintain lean muscle mass   NEXT VISIT: Monday, Aug 10 during infusion  Matthew Yang, [place], [place] Registered Dietitian [phone] (pager)

## 2018-10-03 ENCOUNTER — Other Ambulatory Visit: Payer: Self-pay

## 2018-10-03 ENCOUNTER — Ambulatory Visit
Admission: RE | Admit: 2018-10-03 | Discharge: 2018-10-03 | Disposition: A | Discharge status: Home / Self Care | Payer: No Typology Code available for payment source | Source: Ambulatory Visit | Attending: Radiation Oncology | Admitting: Radiation Oncology

## 2018-10-03 ENCOUNTER — Inpatient Hospital Stay (HOSPITAL_BASED_OUTPATIENT_CLINIC_OR_DEPARTMENT_OTHER): Payer: No Typology Code available for payment source | Admitting: Oncology

## 2018-10-03 DIAGNOSIS — C155 Malignant neoplasm of lower third of esophagus: Secondary | ICD-10-CM

## 2018-10-03 DIAGNOSIS — C160 Malignant neoplasm of cardia: Secondary | ICD-10-CM | POA: Diagnosis not present

## 2018-10-03 DIAGNOSIS — Z5111 Encounter for antineoplastic chemotherapy: Secondary | ICD-10-CM | POA: Diagnosis not present

## 2018-10-03 NOTE — Progress Notes (Signed)
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   DIAGNOSIS: Adenocarcinoma esophagus/GE junction  STAGE: IV     ;GOALS: pallaitive  CURRENT/MOST RECENT THERAPY ; 5FU q 2 weeks+RT      Malignant neoplasm of lower third of esophagus ([place])  08/30/2018 -  Chemotherapy   The patient had palonosetron (ALOXI) injection 0.25 mg, 0.25 mg, Intravenous,  Once, 0 of 3 cycles leucovorin 1,072 mg in dextrose 5 % 250 mL infusion, 400 mg/m2 = 1,072 mg, Intravenous,  Once, 2 of 5 cycles oxaliplatin (ELOXATIN) 230 mg in dextrose 5 % 500 mL chemo infusion, 85 mg/m2 = 230 mg, Intravenous,  Once, 0 of 3 cycles leucovorin injection 54 mg, 20 mg/m2 = 54 mg (100 % of  original dose 20 mg/m2), Intravenous,  Once, 2 of 5 cycles Dose modification: 20 mg/m2 (original dose 20 mg/m2, Cycle 2, Reason: Other (see comments), Comment: iv push), 20 mg/m2 (original dose 20 mg/m2, Cycle 3, Reason: Other (see comments), Comment: ivp) Administration: 54 mg (09/13/2018), 54 mg (10/02/2018) fluorouracil (ADRUCIL) 6,450 mg in sodium chloride 0.9 % 121 mL chemo infusion, 2,400 mg/m2 = 6,450 mg, Intravenous, 1 Day/Dose, 3 of 6 cycles Administration: 6,450 mg (08/30/2018), 6,450 mg (09/13/2018), 6,450 mg (10/02/2018)  for chemotherapy treatment.      Interval history-58 year old male who presents to chemo care clinic today for initial meeting in preparation for starting chemotherapy. I introduced the chemo care clinic and we discussed that the role of the clinic is to assist those who are at an increased risk of emergency room visits and/or complications during the course of chemotherapy treatment. We discussed that the increased risk takes into account factors such as age, performance status, and co-morbidities. We also discussed that for some, this might include barriers to care such as not having a primary care provider, lack of insurance/transportation, or not being able to afford medications. We discussed that the goal of the program is to help prevent unplanned ER visits and help reduce complications during chemotherapy. We do this by discussing specific risk factors to each individual and identifying ways that we can help improve these risk factors and reduce barriers to care.   ECOG FS:0 - Asymptomatic  Review of systems- Review of Systems  Constitutional: Positive for weight loss. Negative for chills, fever and malaise/fatigue.  HENT: Negative for congestion, ear pain and tinnitus.  Eyes: Negative.  Negative for blurred vision and double vision.  Respiratory: Negative.  Negative for cough, sputum production and shortness of breath.   Cardiovascular: Negative.  Negative for chest  pain, palpitations and leg swelling.  Gastrointestinal: Negative.  Negative for abdominal pain, constipation, diarrhea, nausea and vomiting.       Improving dysphagia  Genitourinary: Negative for dysuria, frequency and urgency.  Musculoskeletal: Negative for back pain and falls.  Skin: Negative.  Negative for rash.  Neurological: Negative.  Negative for weakness and headaches.  Endo/Heme/Allergies: Negative.  Does not bruise/bleed easily.  Psychiatric/Behavioral: Negative for depression. The patient is nervous/anxious. The patient does not have insomnia.      Current treatment- s/p cycle three 5-FU with radiation  No Known Allergies  Past Medical History:  Diagnosis Date  . Diabetes mellitus without complication ([place])   . Esophagus cancer ([place])   . Hepatitis    HEPATITIS C  . Hypercholesteremia   . Hypertension   . Morbid obesity ([place])   . Positive hepatitis C antibody test   . Sleep apnea   . Tubular adenoma of colon    polyp    Past Surgical History:  Procedure Laterality Date  . CHOLECYSTECTOMY    . COLONOSCOPY    . COLONOSCOPY WITH PROPOFOL N/A 05/04/2016   Procedure: COLONOSCOPY WITH PROPOFOL;  Surgeon: Lollie Sails, MD;  Location: [facility] ENDOSCOPY;  Service: Endoscopy;  Laterality: N/A;  . ESOPHAGOGASTRODUODENOSCOPY (EGD) WITH PROPOFOL N/A 08/04/2018   Procedure: ESOPHAGOGASTRODUODENOSCOPY (EGD) WITH PROPOFOL;  Surgeon: Lollie Sails, MD;  Location: [facility] ENDOSCOPY;  Service: Endoscopy;  Laterality: N/A;  . PORTA CATH INSERTION N/A 08/17/2018   Procedure: PORTA CATH INSERTION;  Surgeon: Algernon Huxley, MD;  Location: [facility] LAB;  Service: Cardiovascular;  Laterality: N/A;    Social History   Socioeconomic History  . Marital status: Married    Spouse name: Not on file  . Number of children: Not on file  . Years of education: Not on file  . Highest education level: Not on file  Occupational History  . Not on file  Social Needs  . Financial resource  strain: Not on file  . Food insecurity    Worry: Not on file    Inability: Not on file  . Transportation needs    Medical: Not on file    Non-medical: Not on file  Tobacco Use  . Smoking status: Never Smoker  . Smokeless tobacco: Never Used  Substance and Sexual Activity  . Alcohol use: Not Currently  . Drug use: No  . Sexual activity: Not on file  Lifestyle  . Physical activity    Days per week: Not on file    Minutes per session: Not on file  . Stress: Not on file  Relationships  . Social Herbalist on phone: Not on file    Gets together: Not on file    Attends religious service: Not on file    Active member of club or organization: Not on file    Attends meetings of clubs or organizations: Not on file    Relationship status: Not on file  . Intimate partner violence    Fear of current or ex partner: Not on file    Emotionally abused: Not on file    Physically abused: Not on file    Forced sexual activity: Not on file  Other Topics Concern  . 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     Family History  Family history unknown: Yes     Current Outpatient Medications:  .  atorvastatin (LIPITOR) 20 MG tablet, Take 20 mg by mouth daily., Disp: , Rfl:  .  lidocaine-prilocaine (EMLA) cream, Apply 1 application topically as needed., Disp: 30 g, Rfl: 4 .  lisinopril (PRINIVIL,ZESTRIL) 20 MG tablet, Take 20 mg by mouth daily., Disp: , Rfl:  .  Multiple Vitamin (MULTIVITAMIN WITH MINERALS) TABS tablet, Take 1 tablet by mouth daily., Disp: , Rfl:  .  Omega-3 Fatty Acids (FISH OIL) 1000 MG CAPS, Take 1,000 mg by mouth daily. , Disp: , Rfl:  .  ondansetron (ZOFRAN) 8 MG tablet, Take 1 tablet (8 mg total) by mouth every 8 (eight) hours as needed for nausea or vomiting., Disp: 20 tablet, Rfl: 4 .  pantoprazole (PROTONIX) 40 MG tablet, Take 40 mg by mouth daily., Disp: , Rfl:  .  prochlorperazine  (COMPAZINE) 10 MG tablet, Take 1 tablet (10 mg total) by mouth every 6 (six) hours as needed for nausea or vomiting., Disp: 30 tablet, Rfl: 4 .  Turmeric 500 MG CAPS, Take 500 mg by mouth daily., Disp: , Rfl:   Physical exam: There were no vitals filed for this visit. Physical Exam Vitals reviewed: obese.  Constitutional:      Appearance: Normal appearance.  HENT:     Head: Normocephalic and atraumatic.  Eyes:     Pupils: Pupils are equal, round, and reactive to light.  Neck:     Musculoskeletal: Normal range of motion.  Cardiovascular:     Rate and Rhythm: Normal rate and regular rhythm.     Heart sounds: Normal heart sounds. No murmur.  Pulmonary:     Effort: Pulmonary effort is normal.     Breath sounds: Normal breath sounds. No wheezing.  Abdominal:     General: Bowel sounds are normal. There is no distension.     Palpations: Abdomen is soft.     Tenderness: There is no abdominal tenderness.  Musculoskeletal: Normal range of motion.  Skin:    General: Skin is warm and dry.     Findings: No rash.  Neurological:     Mental Status: He is alert and oriented to person, place, and time.  Psychiatric:        Judgment: Judgment normal.      CMP Latest Ref Rng & Units 10/02/2018  Glucose 70 - 99 mg/dL 215(H)  BUN 6 - 20 mg/dL 13  Creatinine 0.61 - 1.24 mg/dL 0.73  Sodium 135 - 145 mmol/L 137  Potassium 3.5 - 5.1 mmol/L 4.1  Chloride 98 - 111 mmol/L 105  CO2 22 - 32 mmol/L 25  Calcium 8.9 - 10.3 mg/dL 8.7(L)  Total Protein 6.5 - 8.1 g/dL 6.7  Total Bilirubin 0.3 - 1.2 mg/dL 0.9  Alkaline Phos 38 - 126 U/L 84  AST 15 - 41 U/L 21  ALT 0 - 44 U/L 18   CBC Latest Ref Rng & Units 10/02/2018  WBC 4.0 - 10.5 K/uL 5.2  Hemoglobin 13.0 - 17.0 g/dL 13.0  Hematocrit 39.0 - 52.0 % 38.4(L)  Platelets 150 - 400 K/uL 130(L)    No images are attached to the encounter.  No results found.   Assessment and plan- Patient is a 58 y.o. male who presents to [facility] for  initial meeting in preparation for starting chemotherapy for the treatment of stage IV esophageal cancer.  Cancer-esophageal cancer.  Patient noted to have difficulty swallowing for approximately 2 months prior to diagnosis.  He admitted to regurgitation and 30 pound weight loss.  Had EGD by Dr. Gustavo Lah revealing a large, fungating and submucosal mass with friable with no recent bleeding was found in the lower third of the esophagus, 40 cm from the incisors. The mass was partially obstructing and circumferential. 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  CT scan from June 2020 showed 2 cm gastrohepatic lymph nodes and up to 12 mm retroperitoneal aortocaval lymph node highly suspicious for distant metastasis.  PET scan showed bulky lower esophageal/gastric tumor and retroperitoneal/aortocaval lymph node/gastrohepatic lymph nodes.  He was diagnosed with stage IV disease.  He was started on palliative chemoradiation with 5-FU and when radiation is complete will add oxaliplatin.  Chemo Care Clinic/High Risk for ER/Hospitalization during chemotherapy- We discussed the role of the chemo care clinic and identified patient specific risk factors. I discussed that patient was identified as high risk primarily based on: Stage of disease. we discussed that social determinants of health may have significant impacts on health and outcomes for cancer patients.  Today we discussed specific social determinants of performance status, alcohol use, depression, financial needs, food insecurity, housing, interpersonal violence, social connections, stress, tobacco use, and transportation.  After lengthy discussion the following were identified as areas of need: Mr. Denne lives at home with his wife.  He is currently still employed and working in [employer] and travels often.  At this time, he does not express any financial concerns.  He does state his family is organizing a charitable  benefit for him in September.  He is highly anxious and was recently started on Xanax 0.25 mg twice daily PRN for anxiety.  Appears to be helping.  He remains active and is able to walk and get around on his own. We discussed self-referral to [facility] for counseling services, psychiatry for medication management, or palliative care/symptom management as well as primary care providers. We discussed that living with cancer can create tremendous financial burden.  We discussed options for assistance. I asked that if assistance is needed in affording medications or paying bills to please let us know so that we can provide assistance. we discussed options for managing stress including healthy eating, exercise as well as participating in no charge counseling services at the cancer center and support groups.  If these are of interest, patient can notify either myself or primary nursing team.  Social Determinants of Health-  Co-morbidities Complicating Care: Mr. Enriqueta Shutter has several comorbidities that may complicate his care including diabetes type 2, morbid obesity, hypercholesterolemia, hypertension and hepatitis C. he is managed by Dr. Ouida Sills his PCP and sees him routinely for problems listed above.  Based on stage of cancer, I will refer patient to palliative care for goals of care and advanced care planning.  We also discussed the role of the Symptom Management Clinic at [facility] for acute issues and methods of contacting clinic/provider. He denies needing specific assistance at this time and He will be followed by Mariea Clonts, RN (Nurse Navigator).   Visit Diagnosis 1. Malignant neoplasm of lower third of esophagus [facility])      Patient expressed understanding and was in agreement with this plan. He also understands that He can call clinic at any time with any questions, concerns, or complaints.   A total of (25) minutes of face-to-face time was spent with this patient with greater than 50%  of that  time in counseling and care-coordination.  Faythe Casa, NP, AGNP-C [place] [facility] [place] (work cell) [phone] (office)  CC: Dr. Rogue Bussing

## 2018-10-04 ENCOUNTER — Ambulatory Visit
Admission: RE | Admit: 2018-10-04 | Discharge: 2018-10-04 | Disposition: A | Discharge status: Home / Self Care | Payer: No Typology Code available for payment source | Source: Ambulatory Visit | Attending: Radiation Oncology | Admitting: Radiation Oncology

## 2018-10-04 ENCOUNTER — Other Ambulatory Visit: Payer: Self-pay

## 2018-10-04 ENCOUNTER — Inpatient Hospital Stay: Payer: No Typology Code available for payment source

## 2018-10-04 VITALS — BP 110/72 | HR 86 | Temp 98.1°F | Resp 20

## 2018-10-04 DIAGNOSIS — Z7189 Other specified counseling: Secondary | ICD-10-CM

## 2018-10-04 DIAGNOSIS — Z5111 Encounter for antineoplastic chemotherapy: Secondary | ICD-10-CM | POA: Diagnosis not present

## 2018-10-04 DIAGNOSIS — C155 Malignant neoplasm of lower third of esophagus: Secondary | ICD-10-CM

## 2018-10-04 MED ORDER — SODIUM CHLORIDE 0.9% FLUSH
10.0000 mL | INTRAVENOUS | Status: DC | PRN
  Administered 2018-10-04: 10 mL
  Filled 2018-10-04: qty 10

## 2018-10-04 MED ORDER — HEPARIN SOD (PORK) LOCK FLUSH 100 UNIT/ML IV SOLN
500.0000 [IU] | Freq: Once | INTRAVENOUS | Status: AC | PRN
  Administered 2018-10-04: 500 [IU]
  Filled 2018-10-04: qty 5

## 2018-10-05 ENCOUNTER — Ambulatory Visit
Admission: RE | Admit: 2018-10-05 | Discharge: 2018-10-05 | Disposition: A | Discharge status: Home / Self Care | Payer: No Typology Code available for payment source | Source: Ambulatory Visit | Attending: Radiation Oncology | Admitting: Radiation Oncology

## 2018-10-05 ENCOUNTER — Other Ambulatory Visit: Payer: Self-pay

## 2018-10-05 DIAGNOSIS — Z5111 Encounter for antineoplastic chemotherapy: Secondary | ICD-10-CM | POA: Diagnosis not present

## 2018-10-06 ENCOUNTER — Other Ambulatory Visit: Payer: Self-pay

## 2018-10-06 ENCOUNTER — Ambulatory Visit
Admission: RE | Admit: 2018-10-06 | Discharge: 2018-10-06 | Disposition: A | Discharge status: Home / Self Care | Payer: No Typology Code available for payment source | Source: Ambulatory Visit | Attending: Radiation Oncology | Admitting: Radiation Oncology

## 2018-10-06 DIAGNOSIS — Z5111 Encounter for antineoplastic chemotherapy: Secondary | ICD-10-CM | POA: Diagnosis not present

## 2018-10-08 ENCOUNTER — Other Ambulatory Visit: Payer: Self-pay

## 2018-10-08 ENCOUNTER — Encounter: Payer: Self-pay | Admitting: Emergency Medicine

## 2018-10-08 ENCOUNTER — Ambulatory Visit
Admission: EM | Admit: 2018-10-08 | Discharge: 2018-10-08 | Disposition: A | Discharge status: Home / Self Care | Payer: No Typology Code available for payment source | Attending: Physician Assistant | Admitting: Physician Assistant

## 2018-10-08 DIAGNOSIS — W268XXA Contact with other sharp object(s), not elsewhere classified, initial encounter: Secondary | ICD-10-CM | POA: Diagnosis not present

## 2018-10-08 DIAGNOSIS — S61412A Laceration without foreign body of left hand, initial encounter: Secondary | ICD-10-CM

## 2018-10-08 MED ORDER — AMOXICILLIN-POT CLAVULANATE 875-125 MG PO TABS: 1.0000 | ORAL_TABLET | Freq: Two times a day (BID) | ORAL | 0 refills | Status: AC

## 2018-10-08 NOTE — Discharge Instructions (Signed)
LACERATION: There have been 3 sutures placed today, wound still slightly open. You came in nearly 24 hours after injury so it is generally not advised to close the wound. Do not get hand wet x 2-3 days and f/u with our clinic or PCP for suture and wound re-check in 2 days. Begin prophylactic antibiotic--this will hopefully prevent infection. However if pain worsens, there is pustular drainage from the wound, redness around the area, or fever, you should return sooner for re-check as you may have an infection. Tylenol for pain. Avoid use of left hand. Come back for removal of sutures in 7-10 days.

## 2018-10-08 NOTE — ED Notes (Signed)
Patient states she has had his Tetanus shot within the last 5 years.

## 2018-10-08 NOTE — ED Triage Notes (Signed)
Patient states he cut his left hand yesterday with a pocket knife. States it was around 3pm yesterday.

## 2018-10-08 NOTE — ED Provider Notes (Signed)
[facility]    CSN: [id number] Arrival date & time: 10/08/18  9767     History   Chief Complaint Chief Complaint  Patient presents with  . Laceration    HPI Matthew Yang is a 58 y.o. male. Patient presents for laceration of the left hand. He says he accidentally cut the hand with a pocket knife yesterday. He says the [facility] was closing at that time so he waited until today to be seen. Tdap updated 5 years ago. Denies problems moving hand or finger. No numbness/tingling or weakness. Patient is on chemotherapy and has DM2 as well as Hepatitis C. He denies other injures. States the area was initially cleaned and bandaged at home by his wife. No other concerns.   HPI  Past Medical History:  Diagnosis Date  . Diabetes mellitus without complication ([place])   . Esophagus cancer ([place])   . Hepatitis    HEPATITIS C  . Hypercholesteremia   . Hypertension   . Morbid obesity ([place])   . Positive hepatitis C antibody test   . Sleep apnea   . Tubular adenoma of colon    polyp    Patient Active Problem List   Diagnosis Date Noted  . Malignant neoplasm of lower third of esophagus ([place]) 08/15/2018  . Tricompartment osteoarthritis of knee 12/26/2014  . Arthritis, senescent 12/26/2014  . Right knee pain 12/26/2014  . Knee derangement syndrome 12/26/2014  . Goals of care, counseling/discussion 08/30/2014  . Type 2 diabetes mellitus ([place]) 09/30/2013  . HCV (hepatitis C virus) 09/30/2013  . Benign hypertension 09/30/2013  . Morbid (severe) obesity due to excess calories ([place]) 09/30/2013  . Obstructive apnea 09/30/2013    Past Surgical History:  Procedure Laterality Date  . CHOLECYSTECTOMY    . COLONOSCOPY    . COLONOSCOPY WITH PROPOFOL N/A 05/04/2016   Procedure: COLONOSCOPY WITH PROPOFOL;  Surgeon: Lollie Sails, MD;  Location: [facility] ENDOSCOPY;  Service: Endoscopy;  Laterality: N/A;  . ESOPHAGOGASTRODUODENOSCOPY (EGD) WITH PROPOFOL N/A 08/04/2018   Procedure:  ESOPHAGOGASTRODUODENOSCOPY (EGD) WITH PROPOFOL;  Surgeon: Lollie Sails, MD;  Location: [facility] ENDOSCOPY;  Service: Endoscopy;  Laterality: N/A;  . PORTA CATH INSERTION N/A 08/17/2018   Procedure: PORTA CATH INSERTION;  Surgeon: Algernon Huxley, MD;  Location: [facility] CV LAB;  Service: Cardiovascular;  Laterality: N/A;       Home Medications    Prior to Admission medications   Medication Sig Start Date End Date Taking? Authorizing Provider  atorvastatin (LIPITOR) 20 MG tablet Take 20 mg by mouth daily.   Yes [provider]  lidocaine-prilocaine (EMLA) cream Apply 1 application topically as needed. 08/23/18  Yes Cammie Sickle, MD  lisinopril (PRINIVIL,ZESTRIL) 20 MG tablet Take 20 mg by mouth daily.   Yes [provider]  Multiple Vitamin (MULTIVITAMIN WITH MINERALS) TABS tablet Take 1 tablet by mouth daily.   Yes [provider]  Omega-3 Fatty Acids (FISH OIL) 1000 MG CAPS Take 1,000 mg by mouth daily.    Yes [provider]  ondansetron (ZOFRAN) 8 MG tablet Take 1 tablet (8 mg total) by mouth every 8 (eight) hours as needed for nausea or vomiting. 08/23/18  Yes Cammie Sickle, MD  pantoprazole (PROTONIX) 40 MG tablet Take 40 mg by mouth daily.   Yes [provider]  prochlorperazine (COMPAZINE) 10 MG tablet Take 1 tablet (10 mg total) by mouth every 6 (six) hours as needed for nausea or vomiting. 08/23/18  Yes Charlaine Dalton  R, MD  Turmeric 500 MG CAPS Take 500 mg by mouth daily.   Yes [provider]  amoxicillin-clavulanate (AUGMENTIN) 875-125 MG tablet Take 1 tablet by mouth every 12 (twelve) hours for 7 days. 10/08/18 10/15/18  Danton Clap, PA-C    Family History Family History  Family history unknown: Yes    Social History Social History   Tobacco Use  . Smoking status: Never Smoker  . Smokeless tobacco: Never Used  Substance Use Topics  . Alcohol use: Not Currently  . Drug use: No      Allergies   Patient has no known allergies.   Review of Systems Review of Systems  Constitutional: Negative for fatigue and fever.  Musculoskeletal: Negative for arthralgias, joint swelling and myalgias.  Skin: Positive for wound. Negative for color change, pallor and rash.  Allergic/Immunologic: Positive for immunocompromised state.  Neurological: Negative for weakness.  Hematological: Bruises/bleeds easily.     Physical Exam Triage Vital Signs ED Triage Vitals  Enc Vitals Group     BP 10/08/18 0937 111/74     Pulse Rate 10/08/18 0937 60     Resp 10/08/18 0937 18     Temp 10/08/18 0937 98.8 F (37.1 C)     Temp Source 10/08/18 0937 Oral     SpO2 10/08/18 0937 98 %     Weight 10/08/18 0932 (!) 305 lb (138.3 kg)     Height 10/08/18 0932 5\' 11"  (1.803 m)     Head Circumference --      Peak Flow --      Pain Score 10/08/18 0932 10     Pain Loc --      Pain Edu? --      Excl. in [place]? --    No data found.  Updated Vital Signs BP 111/74 (BP Location: Right Arm)   Pulse 60   Temp 98.8 F (37.1 C) (Oral)   Resp 18   Ht 5\' 11"  (1.803 m)   Wt (!) 305 lb (138.3 kg)   SpO2 98%   BMI 42.54 kg/m    Physical Exam Vitals signs and nursing note reviewed.  Constitutional:      Appearance: He is obese.  HENT:     Head: Normocephalic and atraumatic.  Eyes:     General: No scleral icterus.    Conjunctiva/sclera: Conjunctivae normal.  Cardiovascular:     Rate and Rhythm: Normal rate and regular rhythm.     Heart sounds: No murmur.  Pulmonary:     Effort: Pulmonary effort is normal. No respiratory distress.     Breath sounds: Normal breath sounds.  Skin:    General: Skin is warm and dry.     Capillary Refill: Capillary refill takes less than 2 seconds.     Comments: LEFT HAND: There is a 4 cm laceration of the left dorsal hand with subcutaneous tissue visible and tendons visible but intact. No bleeding or obvious contamination or FBs. TTP around the wound. Full ROM of  hand and fingers, normal sensation. NVI  Neurological:     General: No focal deficit present.     Mental Status: He is alert. Mental status is at baseline.     Sensory: No sensory deficit.     Motor: No weakness.     Gait: Gait normal.  Psychiatric:        Mood and Affect: Mood normal.        Behavior: Behavior normal.        Thought  Content: Thought content normal.      UC Treatments / Results  Labs (all labs ordered are listed, but only abnormal results are displayed) Labs Reviewed - No data to display  EKG   Radiology No results found.  Procedures Laceration Repair  Date/Time: 10/08/2018 5:14 PM Performed by: Danton Clap, PA-C Authorized by: Danton Clap, PA-C   Consent:    Consent obtained:  Verbal   Consent given by:  Patient   Risks discussed:  Infection, pain, poor cosmetic result, need for additional repair, nerve damage, poor wound healing, vascular damage, tendon damage and retained foreign body   Alternatives discussed:  No treatment, delayed treatment, observation and referral Anesthesia (see MAR for exact dosages):    Anesthesia method:  Local infiltration   Local anesthetic:  Lidocaine 1% w/o epi Laceration details:    Location:  Hand   Hand location:  L hand, dorsum   Length (cm):  4   Depth (mm):  10 Repair type:    Repair type:  Simple Pre-procedure details:    Preparation:  Patient was prepped and draped in usual sterile fashion Exploration:    Wound exploration: entire depth of wound probed and visualized     Wound extent: no foreign bodies/material noted, no muscle damage noted, no tendon damage noted and no vascular damage noted     Contaminated: no   Treatment:    Area cleansed with:  Hibiclens and saline   Amount of cleaning:  Standard   Irrigation solution:  Sterile saline   Irrigation method:  Syringe Skin repair:    Repair method:  Sutures   Suture size:  5-0   Suture material:  Nylon   Suture technique:  Simple interrupted    Number of sutures:  3 Approximation:    Approximation:  Loose Post-procedure details:    Dressing:  Non-adherent dressing   Patient tolerance of procedure:  Tolerated well, no immediate complications Comments:     Also applied 3 steri strips in between sutures   (including critical care time)  Medications Ordered in UC Medications - No data to display  Initial Impression / Assessment and Plan / UC Course  I have reviewed the triage vital signs and the nursing notes.  Pertinent labs & imaging results that were available during my care of the patient were reviewed by me and considered in my medical decision making (see chart for details).   Due to patient's immunocompromised status and timing of the laceration, decided to loosely close with 3 simple sutures as wound was gaping open and open to more contamination/infection. Applied steri strips and bandage as well. Advised close follow up and discussed wound care and importance of monitoring for infection. Patient understanding.   Final Clinical Impressions(s) / UC Diagnoses   Final diagnoses:  Laceration of left hand without foreign body, initial encounter     Discharge Instructions     LACERATION: There have been 3 sutures placed today, wound still slightly open. You came in nearly 24 hours after injury so it is generally not advised to close the wound. Do not get hand wet x 2-3 days and f/u with our clinic or PCP for suture and wound re-check in 2 days. Begin prophylactic antibiotic--this will hopefully prevent infection. However if pain worsens, there is pustular drainage from the wound, redness around the area, or fever, you should return sooner for re-check as you may have an infection. Tylenol for pain. Avoid use of left hand. Come back for  removal of sutures in 7-10 days.    ED Prescriptions    Medication Sig Dispense Auth. Provider   amoxicillin-clavulanate (AUGMENTIN) 875-125 MG tablet Take 1 tablet by mouth every 12  (twelve) hours for 7 days. 14 tablet Danton Clap, PA-C     Controlled Substance Prescriptions [place] Controlled Substance Registry consulted? Not Applicable   Gretta Cool 10/08/18 1720

## 2018-10-09 ENCOUNTER — Other Ambulatory Visit: Payer: Self-pay

## 2018-10-09 ENCOUNTER — Ambulatory Visit
Admission: RE | Admit: 2018-10-09 | Discharge: 2018-10-09 | Disposition: A | Discharge status: Home / Self Care | Payer: No Typology Code available for payment source | Source: Ambulatory Visit | Attending: Radiation Oncology | Admitting: Radiation Oncology

## 2018-10-09 ENCOUNTER — Ambulatory Visit: Payer: No Typology Code available for payment source

## 2018-10-09 DIAGNOSIS — C155 Malignant neoplasm of lower third of esophagus: Secondary | ICD-10-CM | POA: Diagnosis not present

## 2018-10-09 DIAGNOSIS — Z510 Encounter for antineoplastic radiation therapy: Secondary | ICD-10-CM | POA: Diagnosis present

## 2018-10-10 ENCOUNTER — Encounter: Payer: Self-pay | Admitting: Emergency Medicine

## 2018-10-10 ENCOUNTER — Ambulatory Visit
Admission: RE | Admit: 2018-10-10 | Discharge: 2018-10-10 | Disposition: A | Discharge status: Home / Self Care | Payer: No Typology Code available for payment source | Source: Ambulatory Visit | Attending: Radiation Oncology | Admitting: Radiation Oncology

## 2018-10-10 ENCOUNTER — Other Ambulatory Visit: Payer: Self-pay

## 2018-10-10 ENCOUNTER — Ambulatory Visit
Admission: EM | Admit: 2018-10-10 | Discharge: 2018-10-10 | Disposition: A | Discharge status: Home / Self Care | Payer: No Typology Code available for payment source | Attending: Family Medicine | Admitting: Family Medicine

## 2018-10-10 DIAGNOSIS — Z5189 Encounter for other specified aftercare: Secondary | ICD-10-CM

## 2018-10-10 DIAGNOSIS — Z510 Encounter for antineoplastic radiation therapy: Secondary | ICD-10-CM | POA: Diagnosis not present

## 2018-10-10 NOTE — ED Triage Notes (Signed)
Patient here for wound check to left hand from 2 days ago. 3 sutures placed to left hand.

## 2018-10-10 NOTE — ED Provider Notes (Signed)
[facility]    CSN: [id number] Arrival date & time: 10/10/18  1529  History   Chief Complaint Chief Complaint  Patient presents with  . Follow-up    wound check   HPI  58 year old male presents for wound check.  Patient was recently seen on 8/2.  Suffered a laceration to the dorsum of his left hand.  Was encouraged to come back for reevaluation today to ensure that wound is healing well.  Patient feels that his wound is healing well.  No drainage.  No fever.  No other reported symptoms.  No other complaints.  PMH, Surgical Hx, Family Hx, Social History reviewed and updated as below.  Past Medical History:  Diagnosis Date  . Diabetes mellitus without complication ([place])   . Esophagus cancer ([place])   . Hepatitis    HEPATITIS C  . Hypercholesteremia   . Hypertension   . Morbid obesity ([place])   . Positive hepatitis C antibody test   . Sleep apnea   . Tubular adenoma of colon    polyp    Patient Active Problem List   Diagnosis Date Noted  . Malignant neoplasm of lower third of esophagus ([place]) 08/15/2018  . Tricompartment osteoarthritis of knee 12/26/2014  . Arthritis, senescent 12/26/2014  . Right knee pain 12/26/2014  . Knee derangement syndrome 12/26/2014  . Goals of care, counseling/discussion 08/30/2014  . Type 2 diabetes mellitus ([place]) 09/30/2013  . HCV (hepatitis C virus) 09/30/2013  . Benign hypertension 09/30/2013  . Morbid (severe) obesity due to excess calories ([place]) 09/30/2013  . Obstructive apnea 09/30/2013    Past Surgical History:  Procedure Laterality Date  . CHOLECYSTECTOMY    . COLONOSCOPY    . COLONOSCOPY WITH PROPOFOL N/A 05/04/2016   Procedure: COLONOSCOPY WITH PROPOFOL;  Surgeon: Lollie Sails, MD;  Location: [facility] ENDOSCOPY;  Service: Endoscopy;  Laterality: N/A;  . ESOPHAGOGASTRODUODENOSCOPY (EGD) WITH PROPOFOL N/A 08/04/2018   Procedure: ESOPHAGOGASTRODUODENOSCOPY (EGD) WITH PROPOFOL;  Surgeon: Lollie Sails, MD;  Location:  [facility] ENDOSCOPY;  Service: Endoscopy;  Laterality: N/A;  . PORTA CATH INSERTION N/A 08/17/2018   Procedure: PORTA CATH INSERTION;  Surgeon: Algernon Huxley, MD;  Location: [facility] LAB;  Service: Cardiovascular;  Laterality: N/A;       Home Medications    Prior to Admission medications   Medication Sig Start Date End Date Taking? Authorizing Provider  amoxicillin-clavulanate (AUGMENTIN) 875-125 MG tablet Take 1 tablet by mouth every 12 (twelve) hours for 7 days. 10/08/18 10/15/18 Yes Laurene Footman B, PA-C  atorvastatin (LIPITOR) 20 MG tablet Take 20 mg by mouth daily.   Yes [provider]  lidocaine-prilocaine (EMLA) cream Apply 1 application topically as needed. 08/23/18  Yes Cammie Sickle, MD  lisinopril (PRINIVIL,ZESTRIL) 20 MG tablet Take 20 mg by mouth daily.   Yes [provider]  Multiple Vitamin (MULTIVITAMIN WITH MINERALS) TABS tablet Take 1 tablet by mouth daily.   Yes [provider]  Omega-3 Fatty Acids (FISH OIL) 1000 MG CAPS Take 1,000 mg by mouth daily.    Yes [provider]  ondansetron (ZOFRAN) 8 MG tablet Take 1 tablet (8 mg total) by mouth every 8 (eight) hours as needed for nausea or vomiting. 08/23/18  Yes Cammie Sickle, MD  pantoprazole (PROTONIX) 40 MG tablet Take 40 mg by mouth daily.   Yes [provider]  prochlorperazine (COMPAZINE) 10 MG tablet Take 1 tablet (10 mg total) by mouth every 6 (six) hours as needed for  nausea or vomiting. 08/23/18  Yes Cammie Sickle, MD  Turmeric 500 MG CAPS Take 500 mg by mouth daily.   Yes [provider]    Family History Family History  Family history unknown: Yes    Social History Social History   Tobacco Use  . Smoking status: Never Smoker  . Smokeless tobacco: Never Used  Substance Use Topics  . Alcohol use: Not Currently  . Drug use: No     Allergies   Patient has no known allergies.   Review of Systems Review of Systems  Skin:  Positive for wound.   Physical Exam Triage Vital Signs ED Triage Vitals  Enc Vitals Group     BP 10/10/18 1539 104/65     Pulse Rate 10/10/18 1539 72     Resp 10/10/18 1539 18     Temp 10/10/18 1539 99.1 F (37.3 C)     Temp Source 10/10/18 1539 Oral     SpO2 10/10/18 1539 98 %     Weight 10/10/18 1537 300 lb (136.1 kg)     Height 10/10/18 1537 5\' 11"  (1.803 m)     Head Circumference --      Peak Flow --      Pain Score 10/10/18 1537 0     Pain Loc --      Pain Edu? --      Excl. in [place]? --    Updated Vital Signs BP 104/65 (BP Location: Right Arm)   Pulse 72   Temp 99.1 F (37.3 C) (Oral)   Resp 18   Ht 5\' 11"  (1.803 m)   Wt 136.1 kg   SpO2 98%   BMI 41.84 kg/m   Visual Acuity Right Eye Distance:   Left Eye Distance:   Bilateral Distance:    Right Eye Near:   Left Eye Near:    Bilateral Near:     Physical Exam Vitals signs and nursing note reviewed.  Constitutional:      General: He is not in acute distress.    Appearance: Normal appearance. He is obese.  Skin:    Comments: Wound appears to be healing appropriately.  No drainage.  No erythema.  Steri-Strips intact.  Neurological:     Mental Status: He is alert.    UC Treatments / Results  Labs (all labs ordered are listed, but only abnormal results are displayed) Labs Reviewed - No data to display  EKG   Radiology No results found.  Procedures Procedures (including critical care time)  Medications Ordered in UC Medications - No data to display  Initial Impression / Assessment and Plan / UC Course  I have reviewed the triage vital signs and the nursing notes.  Pertinent labs & imaging results that were available during my care of the patient were reviewed by me and considered in my medical decision making (see chart for details).    58 year old male presents for wound check.  Wound healing well.  Advised to return as directed to have sutures removed.  Wound was dressed by nursing staff today.   Final Clinical Impressions(s) / UC Diagnoses   Final diagnoses:  Visit for wound check   Discharge Instructions   None    ED Prescriptions    None     Controlled Substance Prescriptions [place] Controlled Substance Registry consulted? Not Applicable   Coral Spikes, DO 10/10/18 1616

## 2018-10-10 NOTE — ED Notes (Signed)
Non stick dressing applied to left hand and secured with [place]. Patient advised to return for suture removal. No further questions or concerns.

## 2018-10-16 ENCOUNTER — Inpatient Hospital Stay: Payer: No Typology Code available for payment source

## 2018-10-16 ENCOUNTER — Inpatient Hospital Stay: Payer: No Typology Code available for payment source | Attending: Internal Medicine

## 2018-10-16 ENCOUNTER — Inpatient Hospital Stay (HOSPITAL_BASED_OUTPATIENT_CLINIC_OR_DEPARTMENT_OTHER): Payer: No Typology Code available for payment source | Admitting: Internal Medicine

## 2018-10-16 ENCOUNTER — Other Ambulatory Visit: Payer: Self-pay

## 2018-10-16 ENCOUNTER — Encounter: Payer: Self-pay | Admitting: Internal Medicine

## 2018-10-16 VITALS — BP 126/85 | HR 73 | Temp 97.1°F | Resp 20 | Ht 71.0 in | Wt 304.0 lb

## 2018-10-16 DIAGNOSIS — Z4802 Encounter for removal of sutures: Secondary | ICD-10-CM

## 2018-10-16 DIAGNOSIS — Z5111 Encounter for antineoplastic chemotherapy: Secondary | ICD-10-CM | POA: Diagnosis present

## 2018-10-16 DIAGNOSIS — C155 Malignant neoplasm of lower third of esophagus: Secondary | ICD-10-CM

## 2018-10-16 DIAGNOSIS — R1310 Dysphagia, unspecified: Secondary | ICD-10-CM | POA: Diagnosis not present

## 2018-10-16 DIAGNOSIS — G4730 Sleep apnea, unspecified: Secondary | ICD-10-CM | POA: Insufficient documentation

## 2018-10-16 DIAGNOSIS — E7800 Pure hypercholesterolemia, unspecified: Secondary | ICD-10-CM | POA: Diagnosis not present

## 2018-10-16 DIAGNOSIS — Z79899 Other long term (current) drug therapy: Secondary | ICD-10-CM | POA: Insufficient documentation

## 2018-10-16 DIAGNOSIS — I10 Essential (primary) hypertension: Secondary | ICD-10-CM | POA: Insufficient documentation

## 2018-10-16 DIAGNOSIS — Z95828 Presence of other vascular implants and grafts: Secondary | ICD-10-CM

## 2018-10-16 DIAGNOSIS — E119 Type 2 diabetes mellitus without complications: Secondary | ICD-10-CM | POA: Insufficient documentation

## 2018-10-16 DIAGNOSIS — Z7189 Other specified counseling: Secondary | ICD-10-CM

## 2018-10-16 LAB — COMPREHENSIVE METABOLIC PANEL
ALT: 21 U/L (ref 0–44)
AST: 25 U/L (ref 15–41)
Albumin: 3.7 g/dL (ref 3.5–5.0)
Alkaline Phosphatase: 94 U/L (ref 38–126)
Anion gap: 8 (ref 5–15)
BUN: 17 mg/dL (ref 6–20)
CO2: 25 mmol/L (ref 22–32)
Calcium: 8.8 mg/dL — ABNORMAL LOW (ref 8.9–10.3)
Chloride: 105 mmol/L (ref 98–111)
Creatinine, Ser: 0.84 mg/dL (ref 0.61–1.24)
GFR calc Af Amer: >60 mL/min (ref >60)
GFR calc non Af Amer: >60 mL/min (ref >60)
Glucose, Bld: 196 mg/dL — ABNORMAL HIGH (ref 70–99)
Potassium: 4.1 mmol/L (ref 3.5–5.1)
Sodium: 138 mmol/L (ref 135–145)
Total Bilirubin: 1.1 mg/dL (ref 0.3–1.2)
Total Protein: 6.8 g/dL (ref 6.5–8.1)

## 2018-10-16 LAB — CBC WITH DIFFERENTIAL/PLATELET
Abs Immature Granulocytes: 0.01 10*3/uL (ref 0.00–0.07)
Basophils Absolute: 0 10*3/uL (ref 0.0–0.1)
Basophils Relative: 0 %
Eosinophils Absolute: 0.3 10*3/uL (ref 0.0–0.5)
Eosinophils Relative: 6 %
HCT: 38.4 % — ABNORMAL LOW (ref 39.0–52.0)
Hemoglobin: 13.1 g/dL (ref 13.0–17.0)
Immature Granulocytes: 0 %
Lymphocytes Relative: 15 %
Lymphs Abs: 0.8 10*3/uL (ref 0.7–4.0)
MCH: 31 pg (ref 26.0–34.0)
MCHC: 34.1 g/dL (ref 30.0–36.0)
MCV: 90.8 fL (ref 80.0–100.0)
Monocytes Absolute: 1.2 10*3/uL — ABNORMAL HIGH (ref 0.1–1.0)
Monocytes Relative: 23 %
Neutro Abs: 2.9 10*3/uL (ref 1.7–7.7)
Neutrophils Relative %: 56 %
Platelets: 147 10*3/uL — ABNORMAL LOW (ref 150–400)
RBC: 4.23 MIL/uL (ref 4.22–5.81)
RDW: 14.2 % (ref 11.5–15.5)
WBC: 5.2 10*3/uL (ref 4.0–10.5)
nRBC: 0 % (ref 0.0–0.2)

## 2018-10-16 MED ORDER — LEUCOVORIN CALCIUM INJECTION 100 MG: 20.0000 mg/m2 | Freq: Once | INTRAMUSCULAR | Status: DC

## 2018-10-16 MED ORDER — SODIUM CHLORIDE 0.9% FLUSH
10.0000 mL | Freq: Once | INTRAVENOUS | Status: AC
  Administered 2018-10-16: 08:00:00 10 mL via INTRAVENOUS
  Filled 2018-10-16: qty 10

## 2018-10-16 MED ORDER — ONDANSETRON HCL 4 MG/2ML IJ SOLN
8.0000 mg | Freq: Once | INTRAMUSCULAR | Status: AC
  Administered 2018-10-16: 8 mg via INTRAVENOUS
  Filled 2018-10-16: qty 4

## 2018-10-16 MED ORDER — DEXAMETHASONE SODIUM PHOSPHATE 10 MG/ML IJ SOLN
10.0000 mg | Freq: Once | INTRAMUSCULAR | Status: AC
  Administered 2018-10-16: 10 mg via INTRAVENOUS
  Filled 2018-10-16: qty 1

## 2018-10-16 MED ORDER — DEXTROSE 5 % IV SOLN
Freq: Once | INTRAVENOUS | Status: AC
  Administered 2018-10-16: 10:00:00 via INTRAVENOUS
  Filled 2018-10-16: qty 250

## 2018-10-16 MED ORDER — PALONOSETRON HCL INJECTION 0.25 MG/5ML
0.2500 mg | Freq: Once | INTRAVENOUS | Status: AC
  Administered 2018-10-16: 0.25 mg via INTRAVENOUS
  Filled 2018-10-16: qty 5

## 2018-10-16 MED ORDER — SODIUM CHLORIDE 0.9 % IV SOLN
2400.0000 mg/m2 | INTRAVENOUS | Status: DC
  Administered 2018-10-16: 6450 mg via INTRAVENOUS
  Filled 2018-10-16: qty 100

## 2018-10-16 MED ORDER — LEUCOVORIN CALCIUM INJECTION 350 MG
373.0000 mg/m2 | Freq: Once | INTRAVENOUS | Status: AC
  Administered 2018-10-16: 1000 mg via INTRAVENOUS
  Filled 2018-10-16: qty 50

## 2018-10-16 MED ORDER — OXALIPLATIN CHEMO INJECTION 100 MG/20ML
85.0000 mg/m2 | Freq: Once | INTRAVENOUS | Status: AC
  Administered 2018-10-16: 230 mg via INTRAVENOUS
  Filled 2018-10-16: qty 40

## 2018-10-16 NOTE — Progress Notes (Signed)
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   DIAGNOSIS: Adenocarcinoma esophagus/GE junction  STAGE: IV     ;GOALS: pallaitive  CURRENT/MOST RECENT THERAPY ; 5FU q 2 weeks+RT      Malignant neoplasm of lower third of esophagus ([place])  08/30/2018 -  Chemotherapy   The patient had palonosetron (ALOXI) injection 0.25 mg, 0.25 mg, Intravenous,  Once, 1 of 3 cycles leucovorin 1,072 mg in dextrose 5 % 250 mL infusion, 400 mg/m2 = 1,072 mg, Intravenous,  Once, 3 of 5 cycles oxaliplatin (ELOXATIN) 230 mg in dextrose 5 % 500 mL chemo infusion, 85 mg/m2 = 230 mg, Intravenous,  Once, 1 of 3 cycles leucovorin injection 54 mg, 20 mg/m2 = 54 mg (100 % of original dose 20 mg/m2), Intravenous,  Once, 3 of 5 cycles Dose modification: 20 mg/m2 (original dose 20 mg/m2, Cycle 2, Reason: Other (see comments), Comment: iv push), 20 mg/m2 (original dose 20 mg/m2, Cycle 3, Reason: Other (see comments), Comment:  ivp) Administration: 54 mg (09/13/2018), 54 mg (10/02/2018) fluorouracil (ADRUCIL) 6,450 mg in sodium chloride 0.9 % 121 mL chemo infusion, 2,400 mg/m2 = 6,450 mg, Intravenous, 1 Day/Dose, 4 of 6 cycles Administration: 6,450 mg (08/30/2018), 6,450 mg (09/13/2018), 6,450 mg (10/02/2018)  for chemotherapy treatment.      HISTORY OF PRESENTING ILLNESS:  Matthew Yang 58 y.o.  male stage IV adenocarcinoma esophagus currently on 5-FU continuous infusion x48 hours concurrent radiation is here for follow-up.  Patient finished radiation last week.  Patient appetite is good.  Overall difficulty swallowing is improved.  No nausea no vomiting.   10 days ago patient cut his left dorsum of his hand while working outside.  He had stitches placed in the urgent care.  Improving.  Review of Systems  Constitutional: Negative for chills, diaphoresis, fever and malaise/fatigue.  HENT: Negative for nosebleeds and sore throat.   Eyes: Negative for double vision.  Respiratory: Negative for cough, hemoptysis, sputum production, shortness of breath and wheezing.   Cardiovascular: Negative for chest pain, palpitations, orthopnea and leg swelling.  Gastrointestinal: Negative for abdominal pain, blood in stool, constipation, diarrhea, heartburn, melena and nausea.  Genitourinary: Negative for dysuria, frequency and urgency.  Musculoskeletal: Negative for back pain and joint pain.  Skin: Negative.  Negative for itching and rash.  Neurological: Negative for dizziness, tingling, focal weakness, weakness and headaches.  Endo/Heme/Allergies: Does not bruise/bleed easily.  Psychiatric/Behavioral: Negative for depression. The patient is not nervous/anxious and does not have insomnia.      MEDICAL HISTORY:  Past Medical History:  Diagnosis Date  . Diabetes mellitus without complication ([place])   .  Esophagus cancer ([place])   . Hepatitis    HEPATITIS C  . Hypercholesteremia   . Hypertension   . Morbid obesity ([place])   .  Positive hepatitis C antibody test   . Sleep apnea   . Tubular adenoma of colon    polyp    SURGICAL HISTORY: Past Surgical History:  Procedure Laterality Date  . CHOLECYSTECTOMY    . COLONOSCOPY    . COLONOSCOPY WITH PROPOFOL N/A 05/04/2016   Procedure: COLONOSCOPY WITH PROPOFOL;  Surgeon: Lollie Sails, MD;  Location: [facility] ENDOSCOPY;  Service: Endoscopy;  Laterality: N/A;  . ESOPHAGOGASTRODUODENOSCOPY (EGD) WITH PROPOFOL N/A 08/04/2018   Procedure: ESOPHAGOGASTRODUODENOSCOPY (EGD) WITH PROPOFOL;  Surgeon: Lollie Sails, MD;  Location: [facility] ENDOSCOPY;  Service: Endoscopy;  Laterality: N/A;  . PORTA CATH INSERTION N/A 08/17/2018   Procedure: PORTA CATH INSERTION;  Surgeon: Algernon Huxley, MD;  Location: [facility] CV LAB;  Service: Cardiovascular;  Laterality: N/A;    SOCIAL HISTORY: Social History   Socioeconomic History  . Marital status: Married    Spouse name: Not on file  . Number of children: Not on file  . Years of education: Not on file  . Highest education level: Not on file  Occupational History  . Not on file  Social Needs  . Financial resource strain: Not on file  . Food insecurity    Worry: Not on file    Inability: Not on file  . Transportation needs    Medical: Not on file    Non-medical: Not on file  Tobacco Use  . Smoking status: Never Smoker  . Smokeless tobacco: Never Used  Substance and Sexual Activity  . Alcohol use: Not Currently  . Drug use: No  . Sexual activity: Not on file  Lifestyle  . Physical activity    Days per week: Not on file    Minutes per session: Not on file  . Stress: Not on file  Relationships  . Social Herbalist on phone: Not on file    Gets together: Not on file    Attends religious service: Not on file    Active member of club or organization: Not on file    Attends meetings of clubs or organizations: Not on file    Relationship status: Not on file  . Intimate partner violence    Fear of current or  ex partner: Not on file    Emotionally abused: Not on file    Physically abused: Not on file    Forced sexual activity: Not on file  Other Topics Concern  . 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     FAMILY HISTORY: Family History  Family history unknown: Yes    ALLERGIES:  has No Known Allergies.  MEDICATIONS:  Current Outpatient Medications  Medication Sig Dispense Refill  . atorvastatin (LIPITOR) 20 MG tablet Take 20 mg by mouth daily.    Marland Kitchen lidocaine-prilocaine (EMLA) cream Apply 1 application topically as needed. 30 g 4  . lisinopril (PRINIVIL,ZESTRIL) 20 MG tablet Take 20 mg by mouth daily.    . Multiple Vitamin (MULTIVITAMIN WITH MINERALS) TABS tablet Take 1 tablet by mouth daily.    . Omega-3 Fatty Acids (FISH OIL) 1000 MG CAPS Take 1,000 mg by mouth daily.     . ondansetron (ZOFRAN) 8 MG tablet Take 1 tablet (8 mg total) by mouth every 8 (eight) hours  as needed for nausea or vomiting. 20 tablet 4  . pantoprazole (PROTONIX) 40 MG tablet Take 40 mg by mouth daily.    . prochlorperazine (COMPAZINE) 10 MG tablet Take 1 tablet (10 mg total) by mouth every 6 (six) hours as needed for nausea or vomiting. 30 tablet 4  . Turmeric 500 MG CAPS Take 500 mg by mouth daily.     No current facility-administered medications for this visit.    Facility-Administered Medications Ordered in Other Visits  Medication Dose Route Frequency Provider Last Rate Last Dose  . fluorouracil (ADRUCIL) 6,450 mg in sodium chloride 0.9 % 121 mL chemo infusion  2,400 mg/m2 (Treatment Plan Recorded) Intravenous 1 day or 1 dose Cammie Sickle, MD   6,450 mg at 10/16/18 1238      .  PHYSICAL EXAMINATION: ECOG PERFORMANCE STATUS: 1 - Symptomatic but completely ambulatory  Vitals:   10/16/18 0840  BP: 126/85  Pulse: 73  Resp: 20  Temp: (!) 97.1 F (36.2 C)   Filed Weights   10/16/18 0840  Weight: (!) 304 lb (137.9  kg)    Physical Exam  Constitutional: He is oriented to person, place, and time and well-developed, well-nourished, and in no distress.  HENT:  Head: Normocephalic and atraumatic.  Mouth/Throat: Oropharynx is clear and moist. No oropharyngeal exudate.  Eyes: Pupils are equal, round, and reactive to light.  Neck: Normal range of motion. Neck supple.  Cardiovascular: Normal rate and regular rhythm.  Pulmonary/Chest: Effort normal and breath sounds normal. No respiratory distress. He has no wheezes.  Abdominal: Soft. Bowel sounds are normal. He exhibits no distension and no mass. There is no abdominal tenderness. There is no rebound and no guarding.  Musculoskeletal: Normal range of motion.        General: No tenderness or edema.  Neurological: He is alert and oriented to person, place, and time.  Skin: Skin is warm.  Psychiatric: Affect normal.     LABORATORY DATA:  I have reviewed the data as listed Lab Results  Component Value Date   WBC 5.2 10/16/2018   HGB 13.1 10/16/2018   HCT 38.4 (L) 10/16/2018   MCV 90.8 10/16/2018   PLT 147 (L) 10/16/2018   Recent Labs    09/13/18 0915 10/02/18 0843 10/16/18 0825  NA 139 137 138  K 4.1 4.1 4.1  CL 107 105 105  CO2 _0 GLUCOSE 148* 215* 196*  BUN _1 CREATININE 0.72 0.73 0.84  CALCIUM 9.0 8.7* 8.8*  GFRNONAA >60 >60 >60  GFRAA >60 >60 >60  PROT 7.0 6.7 6.8  ALBUMIN 4.0 3.6 3.7  AST _2 ALT _3 ALKPHOS 80 84 94  BILITOT 1.3* 0.9 1.1    RADIOGRAPHIC STUDIES: I have personally reviewed the radiological images as listed and agreed with the findings in the report. No results found.  ASSESSMENT & PLAN:   Malignant neoplasm of lower third of esophagus ([place]) #Adenocarcinoma [poorly differentiated/question signet ring morphology] lower third of esophagus/GE junction.June 2020 PET scan-bulky lower esophageal/gastric tumor; also retroperitoneal/aortocaval lymph nodes/gastrohepatic lymph nodes.   Stage  IV. Currently,  5 FU CIV x 48 hours with s/p RT [until aug 4th, 2020].clinically- improved.   # proceed with cycle #4 of FOLFOX today. Labs today reviewed;  acceptable for treatment today.  Discussed that would recommend reimaging with CT scan after 6 cycles.   # Difficulty swallowing/dysphagia-secondary to malignant tumor- significant improvement noted.   # "blister"  around the port site- resolved.   # Left hand dorsum- trauma s/p stiches; will have stitches taken out in 2 days/with pump removal.  # DISPOSITION: # Treatment today; # follow up in 2 weeks- MD; labs- cbc/cmp- FOLFOX; pump of in 2 days. - Dr.B    All questions were answered. The patient knows to call the clinic with any problems, questions or concerns.    Cammie Sickle, MD 10/16/2018 12:52 PM

## 2018-10-16 NOTE — Progress Notes (Signed)
Per Dr. Rogue Bussing - patient will require suture removal from left hand on Wednesday when pt come from d/c port. Orders entered.

## 2018-10-16 NOTE — Progress Notes (Signed)
Nutrition Follow-up:  Patient with metastatic GE junction adenocarcinoma.  Patient is receiving chemotherapy and has completed radiation.  Followed by Dr. Milderd Meager with patient during infusion.  Reports that he continues to tolerate most consistencies of foods.  Ate barbecue recently and feels ate too much and vomited small amount back up.  After vomiting episode felt fine.  Continues to eat good sources of protein. Did report few episodes of nausea feeling but felt it was related to taking pills on empty stomach.   Noted wound on hand.   Medications: reviewed  Labs: glucose 196  Anthropometrics:   Weight 305 lb stable.  304 lb on 7/27 and 306 lb on 7/8 and 6/24   NUTRITION DIAGNOSIS: Inadequate oral intake improving   INTERVENTION:  Reviewed foods high in protein and encouraged at every meal.  Reviewed ways to add liquid to protein foods if needed.  Reviewed foods to avoid with cold sensitivity from oxaliplatin.      MONITORING, EVALUATION, GOAL: Patient will consume adequate calories and protein to maintain lean muscle mass  NEXT VISIT: to be determined  Matthew Yang, Matthew Yang, Matthew Yang Registered Dietitian [phone] (pager)

## 2018-10-16 NOTE — Assessment & Plan Note (Addendum)
#  Adenocarcinoma [poorly differentiated/question signet ring morphology] lower third of esophagus/GE junction.June 2020 PET scan-bulky lower esophageal/gastric tumor; also retroperitoneal/aortocaval lymph nodes/gastrohepatic lymph nodes.   Stage IV. Currently,  5 FU CIV x 48 hours with s/p RT [until aug 4th, 2020].clinically- improved.   # proceed with cycle #4 of FOLFOX today. Labs today reviewed;  acceptable for treatment today.  Discussed that would recommend reimaging with CT scan after 6 cycles.   # Difficulty swallowing/dysphagia-secondary to malignant tumor- significant improvement noted.   # "blister" around the port site- resolved.   # Left hand dorsum- trauma s/p stiches; will have stitches taken out in 2 days/with pump removal.  # DISPOSITION: # Treatment today; # follow up in 2 weeks- MD; labs- cbc/cmp- FOLFOX; pump of in 2 days. - Dr.B

## 2018-10-18 ENCOUNTER — Other Ambulatory Visit: Payer: Self-pay

## 2018-10-18 ENCOUNTER — Inpatient Hospital Stay: Payer: No Typology Code available for payment source

## 2018-10-18 VITALS — BP 108/68 | HR 72 | Temp 97.0°F | Resp 20

## 2018-10-18 DIAGNOSIS — C155 Malignant neoplasm of lower third of esophagus: Secondary | ICD-10-CM

## 2018-10-18 DIAGNOSIS — Z5111 Encounter for antineoplastic chemotherapy: Secondary | ICD-10-CM | POA: Diagnosis not present

## 2018-10-18 DIAGNOSIS — Z4802 Encounter for removal of sutures: Secondary | ICD-10-CM

## 2018-10-18 DIAGNOSIS — Z7189 Other specified counseling: Secondary | ICD-10-CM

## 2018-10-18 MED ORDER — HEPARIN SOD (PORK) LOCK FLUSH 100 UNIT/ML IV SOLN
500.0000 [IU] | Freq: Once | INTRAVENOUS | Status: AC | PRN
  Administered 2018-10-18: 12:00:00 500 [IU]

## 2018-10-18 MED ORDER — SODIUM CHLORIDE 0.9% FLUSH
10.0000 mL | INTRAVENOUS | Status: DC | PRN
  Administered 2018-10-18: 10 mL
  Filled 2018-10-18: qty 10

## 2018-10-18 NOTE — Progress Notes (Signed)
3 sutures removed from left hand per Haywood Pao RN.  No redness or swelling noted.  Steri strips applied and lightly wrapped with coban.

## 2018-10-27 ENCOUNTER — Other Ambulatory Visit: Payer: Self-pay

## 2018-10-30 ENCOUNTER — Other Ambulatory Visit: Payer: Self-pay

## 2018-10-30 ENCOUNTER — Inpatient Hospital Stay: Payer: No Typology Code available for payment source

## 2018-10-30 ENCOUNTER — Encounter: Payer: Self-pay | Admitting: Internal Medicine

## 2018-10-30 ENCOUNTER — Inpatient Hospital Stay (HOSPITAL_BASED_OUTPATIENT_CLINIC_OR_DEPARTMENT_OTHER): Payer: No Typology Code available for payment source | Admitting: Internal Medicine

## 2018-10-30 ENCOUNTER — Inpatient Hospital Stay: Payer: No Typology Code available for payment source | Admitting: *Deleted

## 2018-10-30 DIAGNOSIS — Z7189 Other specified counseling: Secondary | ICD-10-CM

## 2018-10-30 DIAGNOSIS — Z5111 Encounter for antineoplastic chemotherapy: Secondary | ICD-10-CM | POA: Diagnosis not present

## 2018-10-30 DIAGNOSIS — C155 Malignant neoplasm of lower third of esophagus: Secondary | ICD-10-CM

## 2018-10-30 DIAGNOSIS — Z95828 Presence of other vascular implants and grafts: Secondary | ICD-10-CM

## 2018-10-30 LAB — CBC WITH DIFFERENTIAL/PLATELET
Abs Immature Granulocytes: 0.01 10*3/uL (ref 0.00–0.07)
Basophils Absolute: 0 10*3/uL (ref 0.0–0.1)
Basophils Relative: 1 %
Eosinophils Absolute: 0.3 10*3/uL (ref 0.0–0.5)
Eosinophils Relative: 8 %
HCT: 38.9 % — ABNORMAL LOW (ref 39.0–52.0)
Hemoglobin: 13.2 g/dL (ref 13.0–17.0)
Immature Granulocytes: 0 %
Lymphocytes Relative: 22 %
Lymphs Abs: 1 10*3/uL (ref 0.7–4.0)
MCH: 30.8 pg (ref 26.0–34.0)
MCHC: 33.9 g/dL (ref 30.0–36.0)
MCV: 90.9 fL (ref 80.0–100.0)
Monocytes Absolute: 1 10*3/uL (ref 0.1–1.0)
Monocytes Relative: 23 %
Neutro Abs: 2 10*3/uL (ref 1.7–7.7)
Neutrophils Relative %: 46 %
Platelets: 134 10*3/uL — ABNORMAL LOW (ref 150–400)
RBC: 4.28 MIL/uL (ref 4.22–5.81)
RDW: 14.6 % (ref 11.5–15.5)
WBC: 4.4 10*3/uL (ref 4.0–10.5)
nRBC: 0 % (ref 0.0–0.2)

## 2018-10-30 LAB — COMPREHENSIVE METABOLIC PANEL
ALT: 30 U/L (ref 0–44)
AST: 29 U/L (ref 15–41)
Albumin: 3.9 g/dL (ref 3.5–5.0)
Alkaline Phosphatase: 103 U/L (ref 38–126)
Anion gap: 8 (ref 5–15)
BUN: 16 mg/dL (ref 6–20)
CO2: 25 mmol/L (ref 22–32)
Calcium: 8.7 mg/dL — ABNORMAL LOW (ref 8.9–10.3)
Chloride: 106 mmol/L (ref 98–111)
Creatinine, Ser: 0.89 mg/dL (ref 0.61–1.24)
GFR calc Af Amer: >60 mL/min (ref >60)
GFR calc non Af Amer: >60 mL/min (ref >60)
Glucose, Bld: 159 mg/dL — ABNORMAL HIGH (ref 70–99)
Potassium: 3.9 mmol/L (ref 3.5–5.1)
Sodium: 139 mmol/L (ref 135–145)
Total Bilirubin: 1.2 mg/dL (ref 0.3–1.2)
Total Protein: 6.9 g/dL (ref 6.5–8.1)

## 2018-10-30 MED ORDER — LEUCOVORIN CALCIUM INJECTION 100 MG: 20.0000 mg/m2 | Freq: Once | INTRAMUSCULAR | Status: DC

## 2018-10-30 MED ORDER — ONDANSETRON HCL 4 MG/2ML IJ SOLN: 4.0000 mg | Freq: Once | INTRAMUSCULAR | Status: DC

## 2018-10-30 MED ORDER — SODIUM CHLORIDE 0.9% FLUSH
10.0000 mL | Freq: Once | INTRAVENOUS | Status: AC
  Administered 2018-10-30: 10 mL via INTRAVENOUS
  Filled 2018-10-30: qty 10

## 2018-10-30 MED ORDER — PALONOSETRON HCL INJECTION 0.25 MG/5ML
0.2500 mg | Freq: Once | INTRAVENOUS | Status: AC
  Administered 2018-10-30: 0.25 mg via INTRAVENOUS
  Filled 2018-10-30: qty 5

## 2018-10-30 MED ORDER — SODIUM CHLORIDE 0.9 % IV SOLN
2400.0000 mg/m2 | INTRAVENOUS | Status: DC
  Administered 2018-10-30: 6450 mg via INTRAVENOUS
  Filled 2018-10-30: qty 100

## 2018-10-30 MED ORDER — DEXTROSE 5 % IV SOLN
Freq: Once | INTRAVENOUS | Status: AC
  Administered 2018-10-30: 10:00:00 via INTRAVENOUS
  Filled 2018-10-30: qty 250

## 2018-10-30 MED ORDER — OXALIPLATIN CHEMO INJECTION 100 MG/20ML
85.0000 mg/m2 | Freq: Once | INTRAVENOUS | Status: AC
  Administered 2018-10-30: 230 mg via INTRAVENOUS
  Filled 2018-10-30: qty 40

## 2018-10-30 MED ORDER — SODIUM CHLORIDE 0.9% FLUSH
10.0000 mL | INTRAVENOUS | Status: DC | PRN
  Administered 2018-10-30: 10 mL via INTRAVENOUS
  Filled 2018-10-30: qty 10

## 2018-10-30 MED ORDER — DEXAMETHASONE SODIUM PHOSPHATE 10 MG/ML IJ SOLN
10.0000 mg | Freq: Once | INTRAMUSCULAR | Status: AC
  Administered 2018-10-30: 10 mg via INTRAVENOUS
  Filled 2018-10-30: qty 1

## 2018-10-30 MED ORDER — LEUCOVORIN CALCIUM INJECTION 350 MG
373.0000 mg/m2 | Freq: Once | INTRAVENOUS | Status: AC
  Administered 2018-10-30: 1000 mg via INTRAVENOUS
  Filled 2018-10-30: qty 50

## 2018-10-30 NOTE — Assessment & Plan Note (Addendum)
#  Adenocarcinoma [poorly differentiated/question signet ring morphology] lower third of esophagus/GE junction.June 2020 PET scan-bulky lower esophageal/gastric tumor; also retroperitoneal/aortocaval lymph nodes/gastrohepatic lymph nodes.   Stage IV. S/p 5FU q2Wx4-with RT.  # Currently on FOLFOX.  STABLE.    # proceed with cycle #2 of FOLFOX today. Labs today reviewed;  acceptable for treatment today.  Discussed that would recommend reimaging with CT scan after 6 cycles; will order CT scan at next visit.   # Difficulty swallowing/dysphagia-secondary to malignant tumor- significant improvement noted.   # Left hand dorsum- trauma s/p stiches- improved.   # DISPOSITION: # Treatment today;pump off in 2 days.  # follow up in 2 weeks- MD; labs- cbc/cmp- FOLFOX; pump of in 2 days. - Dr.B

## 2018-10-30 NOTE — Progress Notes (Signed)
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  # June rd week- 5FU pump every 2 days; +RT [until aug 4th]  # 10/16/2018-FOLFOX #1  # DM- diet controlled;   # NGS/omniseq- PDL-1 CPS 20%;EGFR-copy number gain; NEG- targets/ Her-2 neu.   DIAGNOSIS: Adenocarcinoma esophagus/GE junction  STAGE: IV     ;GOALS: pallaitive  CURRENT/MOST RECENT THERAPY ; 5FU q 2 weeks+RT      Malignant neoplasm of lower third of esophagus ([facility])  08/30/2018 -  Chemotherapy   The patient had palonosetron (ALOXI) injection 0.25 mg, 0.25 mg, Intravenous,  Once, 2 of 5 cycles Administration: 0.25 mg (10/30/2018), 0.25 mg (10/16/2018) leucovorin 1,072 mg in dextrose 5 % 250 mL infusion, 400 mg/m2 = 1,072 mg, Intravenous,  Once, 4 of 7 cycles Administration: 1,000 mg (10/30/2018), 1,000 mg (10/16/2018) oxaliplatin (ELOXATIN) 230 mg in dextrose 5 % 500 mL chemo infusion, 85 mg/m2 = 230 mg, Intravenous,  Once, 2 of 5 cycles Administration: 230 mg (10/30/2018), 230 mg (10/16/2018) leucovorin injection 54 mg, 20 mg/m2 = 54 mg (100 % of original dose 20 mg/m2), Intravenous,   Once, 4 of 7 cycles Dose modification: 20 mg/m2 (original dose 20 mg/m2, Cycle 2, Reason: Other (see comments), Comment: iv push), 20 mg/m2 (original dose 20 mg/m2, Cycle 3, Reason: Other (see comments), Comment: ivp) Administration: 54 mg (09/13/2018), 54 mg (10/02/2018) fluorouracil (ADRUCIL) 6,450 mg in sodium chloride 0.9 % 121 mL chemo infusion, 2,400 mg/m2 = 6,450 mg, Intravenous, 1 Day/Dose, 5 of 8 cycles Administration: 6,450 mg (08/30/2018), 6,450 mg (09/13/2018), 6,450 mg (10/02/2018), 6,450 mg (10/16/2018)  for chemotherapy treatment.      HISTORY OF PRESENTING ILLNESS:  Matthew Yang 58 y.o.  male stage IV adenocarcinoma esophagus currently on FOLFOX chemotherapy is here for follow-up.  Patient continues to have intermittent difficulty swallowing especially with with foods like bread.  Otherwise denies any regurgitation.  Weight is overall stable.  He continues to be physically active.  Mild fatigue from oxaliplatin.  Otherwise no significant tingling or numbness.  Review of Systems  Constitutional: Negative for chills, diaphoresis, fever and malaise/fatigue.  HENT: Negative for nosebleeds and sore throat.   Eyes: Negative for double vision.  Respiratory: Negative for cough, hemoptysis, sputum production, shortness of breath and wheezing.   Cardiovascular: Negative for chest pain, palpitations, orthopnea and leg swelling.  Gastrointestinal: Negative for abdominal pain, blood in stool, constipation, diarrhea, heartburn, melena and nausea.  Genitourinary: Negative for dysuria, frequency and urgency.  Musculoskeletal: Negative for back pain and joint pain.  Skin: Negative.  Negative for itching and rash.  Neurological: Negative for dizziness, tingling, focal weakness, weakness and headaches.  Endo/Heme/Allergies: Does not bruise/bleed easily.  Psychiatric/Behavioral: Negative for depression. The patient is not nervous/anxious and does not have insomnia.        MEDICAL HISTORY:  Past  Medical History:  Diagnosis Date  . Diabetes mellitus without complication ([facility])   . Esophagus cancer ([facility])   . Hepatitis    HEPATITIS C  . Hypercholesteremia   . Hypertension   . Morbid obesity ([facility])   . Positive hepatitis C antibody test   . Sleep apnea   . Tubular adenoma of colon    polyp    SURGICAL HISTORY: Past Surgical History:  Procedure Laterality Date  . CHOLECYSTECTOMY    . COLONOSCOPY    . COLONOSCOPY WITH PROPOFOL N/A 05/04/2016   Procedure: COLONOSCOPY WITH PROPOFOL;  Surgeon: Martin U Skulskie, MD;  Location: [facility] ENDOSCOPY;  Service: Endoscopy;  Laterality: N/A;  . ESOPHAGOGASTRODUODENOSCOPY (EGD) WITH PROPOFOL N/A 08/04/2018   Procedure: ESOPHAGOGASTRODUODENOSCOPY (EGD) WITH PROPOFOL;  Surgeon: Skulskie, Martin U, MD;  Location: [facility] ENDOSCOPY;  Service: Endoscopy;  Laterality: N/A;  . PORTA CATH INSERTION N/A 08/17/2018   Procedure: PORTA CATH INSERTION;  Surgeon: Dew, Jason S, MD;  Location: [facility] INVASIVE CV LAB;  Service: Cardiovascular;  Laterality: N/A;    SOCIAL HISTORY: Social History   Socioeconomic History  . Marital status: Married    Spouse name: Not on file  . Number of children: Not on file  . Years of education: Not on file  . Highest education level: Not on file  Occupational History  . Not on file  Social Needs  . Financial resource strain: Not on file  . Food insecurity    Worry: Not on file    Inability: Not on file  . Transportation needs    Medical: Not on file    Non-medical: Not on file  Tobacco Use  . Smoking status: Never Smoker  . Smokeless tobacco: Never Used  Substance and Sexual Activity  . Alcohol use: Not Currently  . Drug use: No  . Sexual activity: Not on file  Lifestyle  . Physical activity    Days per week: Not on file    Minutes per session: Not on file  . Stress: Not on file  Relationships  . Social connections    Talks on phone: Not on file    Gets together: Not on file    Attends religious service: Not  on file    Active member of club or organization: Not on file    Attends meetings of clubs or organizations: Not on file    Relationship status: Not on file  . Intimate partner violence    Fear of current or ex partner: Not on file    Emotionally abused: Not on file    Physically abused: Not on file    Forced sexual activity: Not on file  Other Topics Concern  . 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     FAMILY HISTORY: Family History  Family history unknown: Yes    ALLERGIES:  has No Known Allergies.  MEDICATIONS:  Current Outpatient Medications  Medication Sig Dispense Refill  . atorvastatin (LIPITOR) 20 MG tablet Take 20 mg by mouth daily.    . lidocaine-prilocaine (EMLA) cream Apply 1 application topically as needed. 30 g 4  . lisinopril (PRINIVIL,ZESTRIL) 20 MG tablet Take 20 mg by mouth daily.    . Multiple Vitamin (MULTIVITAMIN WITH MINERALS) TABS tablet Take 1 tablet by mouth daily.    . Omega-3 Fatty Acids (FISH OIL) 1000 MG CAPS Take 1,000 mg by mouth daily.     .   ondansetron (ZOFRAN) 8 MG tablet Take 1 tablet (8 mg total) by mouth every 8 (eight) hours as needed for nausea or vomiting. 20 tablet 4  . pantoprazole (PROTONIX) 40 MG tablet Take 40 mg by mouth daily.    . prochlorperazine (COMPAZINE) 10 MG tablet Take 1 tablet (10 mg total) by mouth every 6 (six) hours as needed for nausea or vomiting. 30 tablet 4  . Turmeric 500 MG CAPS Take 500 mg by mouth daily.     No current facility-administered medications for this visit.       Marland Kitchen  PHYSICAL EXAMINATION: ECOG PERFORMANCE STATUS: 1 - Symptomatic but completely ambulatory  Vitals:   10/30/18 0904  BP: 116/75  Pulse: 61  Resp: 16  Temp: (!) 96.8 F (36 C)   Filed Weights   10/30/18 0904  Weight: 296 lb 12.8 oz (134.6 kg)    Physical Exam  Constitutional: He is oriented to person, place, and time and well-developed,  well-nourished, and in no distress.  HENT:  Head: Normocephalic and atraumatic.  Mouth/Throat: Oropharynx is clear and moist. No oropharyngeal exudate.  Eyes: Pupils are equal, round, and reactive to light.  Neck: Normal range of motion. Neck supple.  Cardiovascular: Normal rate and regular rhythm.  Pulmonary/Chest: Effort normal and breath sounds normal. No respiratory distress. He has no wheezes.  Abdominal: Soft. Bowel sounds are normal. He exhibits no distension and no mass. There is no abdominal tenderness. There is no rebound and no guarding.  Musculoskeletal: Normal range of motion.        General: No tenderness or edema.  Neurological: He is alert and oriented to person, place, and time.  Skin: Skin is warm.  Psychiatric: Affect normal.     LABORATORY DATA:  I have reviewed the data as listed Lab Results  Component Value Date   WBC 4.4 10/30/2018   HGB 13.2 10/30/2018   HCT 38.9 (L) 10/30/2018   MCV 90.9 10/30/2018   PLT 134 (L) 10/30/2018   Recent Labs    10/02/18 0843 10/16/18 0825 10/30/18 0820  NA 137 138 139  K 4.1 4.1 3.9  CL 105 105 106  CO2 _0 GLUCOSE 215* 196* 159*  BUN _1 CREATININE 0.73 0.84 0.89  CALCIUM 8.7* 8.8* 8.7*  GFRNONAA >60 >60 >60  GFRAA >60 >60 >60  PROT 6.7 6.8 6.9  ALBUMIN 3.6 3.7 3.9  AST _2 ALT _3 ALKPHOS 84 94 103  BILITOT 0.9 1.1 1.2    RADIOGRAPHIC STUDIES: I have personally reviewed the radiological images as listed and agreed with the findings in the report. No results found.  ASSESSMENT & PLAN:   Malignant neoplasm of lower third of esophagus ([place]) #Adenocarcinoma [poorly differentiated/question signet ring morphology] lower third of esophagus/GE junction.June 2020 PET scan-bulky lower esophageal/gastric tumor; also retroperitoneal/aortocaval lymph nodes/gastrohepatic lymph nodes.   Stage IV. S/p 5FU q2Wx4-with RT.  # Currently on FOLFOX.  STABLE.    # proceed with cycle #2 of FOLFOX  today. Labs today reviewed;  acceptable for treatment today.  Discussed that would recommend reimaging with CT scan after 6 cycles; will order CT scan at next visit.   # Difficulty swallowing/dysphagia-secondary to malignant tumor- significant improvement noted.   # Left hand dorsum- trauma s/p stiches- improved.   # DISPOSITION: # Treatment today;pump off in 2 days.  # follow up in 2 weeks- MD; labs- cbc/cmp- FOLFOX; pump of in 2 days. - Dr.B  All questions were answered. The patient knows to call the clinic with any problems, questions or concerns.     R , MD 10/31/2018 7:52 AM    

## 2018-11-01 ENCOUNTER — Other Ambulatory Visit: Payer: Self-pay

## 2018-11-01 ENCOUNTER — Inpatient Hospital Stay: Payer: No Typology Code available for payment source

## 2018-11-01 DIAGNOSIS — Z5111 Encounter for antineoplastic chemotherapy: Secondary | ICD-10-CM | POA: Diagnosis not present

## 2018-11-01 DIAGNOSIS — Z7189 Other specified counseling: Secondary | ICD-10-CM

## 2018-11-01 DIAGNOSIS — C155 Malignant neoplasm of lower third of esophagus: Secondary | ICD-10-CM

## 2018-11-01 MED ORDER — HEPARIN SOD (PORK) LOCK FLUSH 100 UNIT/ML IV SOLN
INTRAVENOUS | Status: AC
  Filled 2018-11-01: qty 5

## 2018-11-01 MED ORDER — SODIUM CHLORIDE 0.9% FLUSH
10.0000 mL | INTRAVENOUS | Status: DC | PRN
  Administered 2018-11-01: 10 mL
  Filled 2018-11-01: qty 10

## 2018-11-01 MED ORDER — HEPARIN SOD (PORK) LOCK FLUSH 100 UNIT/ML IV SOLN
500.0000 [IU] | Freq: Once | INTRAVENOUS | Status: AC | PRN
  Administered 2018-11-01: 15:00:00 500 [IU]

## 2018-11-06 ENCOUNTER — Ambulatory Visit: Payer: No Typology Code available for payment source | Attending: Radiation Oncology | Admitting: Radiation Oncology

## 2018-11-10 ENCOUNTER — Other Ambulatory Visit: Payer: Self-pay

## 2018-11-10 ENCOUNTER — Encounter: Payer: Self-pay | Admitting: Internal Medicine

## 2018-11-10 NOTE — Progress Notes (Signed)
Patient denies any concerns today.  

## 2018-11-14 ENCOUNTER — Other Ambulatory Visit: Payer: Self-pay

## 2018-11-14 ENCOUNTER — Inpatient Hospital Stay (HOSPITAL_BASED_OUTPATIENT_CLINIC_OR_DEPARTMENT_OTHER): Payer: No Typology Code available for payment source | Admitting: Internal Medicine

## 2018-11-14 ENCOUNTER — Inpatient Hospital Stay: Payer: No Typology Code available for payment source | Attending: Internal Medicine

## 2018-11-14 ENCOUNTER — Encounter: Payer: Self-pay | Admitting: Internal Medicine

## 2018-11-14 ENCOUNTER — Inpatient Hospital Stay: Payer: No Typology Code available for payment source

## 2018-11-14 DIAGNOSIS — E119 Type 2 diabetes mellitus without complications: Secondary | ICD-10-CM | POA: Diagnosis not present

## 2018-11-14 DIAGNOSIS — Z79899 Other long term (current) drug therapy: Secondary | ICD-10-CM | POA: Diagnosis not present

## 2018-11-14 DIAGNOSIS — D696 Thrombocytopenia, unspecified: Secondary | ICD-10-CM | POA: Insufficient documentation

## 2018-11-14 DIAGNOSIS — C155 Malignant neoplasm of lower third of esophagus: Secondary | ICD-10-CM | POA: Insufficient documentation

## 2018-11-14 DIAGNOSIS — I10 Essential (primary) hypertension: Secondary | ICD-10-CM | POA: Insufficient documentation

## 2018-11-14 DIAGNOSIS — Z5111 Encounter for antineoplastic chemotherapy: Secondary | ICD-10-CM | POA: Insufficient documentation

## 2018-11-14 DIAGNOSIS — R1310 Dysphagia, unspecified: Secondary | ICD-10-CM | POA: Insufficient documentation

## 2018-11-14 DIAGNOSIS — E7800 Pure hypercholesterolemia, unspecified: Secondary | ICD-10-CM | POA: Insufficient documentation

## 2018-11-14 DIAGNOSIS — G4730 Sleep apnea, unspecified: Secondary | ICD-10-CM | POA: Insufficient documentation

## 2018-11-14 DIAGNOSIS — Z923 Personal history of irradiation: Secondary | ICD-10-CM | POA: Insufficient documentation

## 2018-11-14 DIAGNOSIS — Z7189 Other specified counseling: Secondary | ICD-10-CM

## 2018-11-14 LAB — COMPREHENSIVE METABOLIC PANEL
ALT: 29 U/L (ref 0–44)
AST: 28 U/L (ref 15–41)
Albumin: 3.9 g/dL (ref 3.5–5.0)
Alkaline Phosphatase: 125 U/L (ref 38–126)
Anion gap: 7 (ref 5–15)
BUN: 18 mg/dL (ref 6–20)
CO2: 25 mmol/L (ref 22–32)
Calcium: 9.1 mg/dL (ref 8.9–10.3)
Chloride: 108 mmol/L (ref 98–111)
Creatinine, Ser: 0.84 mg/dL (ref 0.61–1.24)
GFR calc Af Amer: >60 mL/min (ref >60)
GFR calc non Af Amer: >60 mL/min (ref >60)
Glucose, Bld: 174 mg/dL — ABNORMAL HIGH (ref 70–99)
Potassium: 4.1 mmol/L (ref 3.5–5.1)
Sodium: 140 mmol/L (ref 135–145)
Total Bilirubin: 1 mg/dL (ref 0.3–1.2)
Total Protein: 6.9 g/dL (ref 6.5–8.1)

## 2018-11-14 LAB — CBC WITH DIFFERENTIAL/PLATELET
Abs Immature Granulocytes: 0.01 10*3/uL (ref 0.00–0.07)
Basophils Absolute: 0 10*3/uL (ref 0.0–0.1)
Basophils Relative: 1 %
Eosinophils Absolute: 0.3 10*3/uL (ref 0.0–0.5)
Eosinophils Relative: 8 %
HCT: 39.7 % (ref 39.0–52.0)
Hemoglobin: 13.5 g/dL (ref 13.0–17.0)
Immature Granulocytes: 0 %
Lymphocytes Relative: 22 %
Lymphs Abs: 0.9 10*3/uL (ref 0.7–4.0)
MCH: 31 pg (ref 26.0–34.0)
MCHC: 34 g/dL (ref 30.0–36.0)
MCV: 91.3 fL (ref 80.0–100.0)
Monocytes Absolute: 1 10*3/uL (ref 0.1–1.0)
Monocytes Relative: 26 %
Neutro Abs: 1.8 10*3/uL (ref 1.7–7.7)
Neutrophils Relative %: 43 %
Platelets: 117 10*3/uL — ABNORMAL LOW (ref 150–400)
RBC: 4.35 MIL/uL (ref 4.22–5.81)
RDW: 14.5 % (ref 11.5–15.5)
WBC: 4 10*3/uL (ref 4.0–10.5)
nRBC: 0 % (ref 0.0–0.2)

## 2018-11-14 MED ORDER — OXALIPLATIN CHEMO INJECTION 100 MG/20ML
85.0000 mg/m2 | Freq: Once | INTRAVENOUS | Status: AC
  Administered 2018-11-14: 230 mg via INTRAVENOUS
  Filled 2018-11-14: qty 40

## 2018-11-14 MED ORDER — PALONOSETRON HCL INJECTION 0.25 MG/5ML
0.2500 mg | Freq: Once | INTRAVENOUS | Status: AC
  Administered 2018-11-14: 0.25 mg via INTRAVENOUS
  Filled 2018-11-14: qty 5

## 2018-11-14 MED ORDER — LEUCOVORIN CALCIUM INJECTION 350 MG
1000.0000 mg | Freq: Once | INTRAVENOUS | Status: AC
  Administered 2018-11-14: 10:00:00 1000 mg via INTRAVENOUS
  Filled 2018-11-14: qty 50

## 2018-11-14 MED ORDER — HEPARIN SOD (PORK) LOCK FLUSH 100 UNIT/ML IV SOLN: 500.0000 [IU] | Freq: Once | INTRAVENOUS | Status: DC

## 2018-11-14 MED ORDER — LEUCOVORIN CALCIUM INJECTION 100 MG: 20.0000 mg/m2 | Freq: Once | INTRAMUSCULAR | Status: DC

## 2018-11-14 MED ORDER — DEXTROSE 5 % IV SOLN
Freq: Once | INTRAVENOUS | Status: AC
  Administered 2018-11-14: 10:00:00 via INTRAVENOUS
  Filled 2018-11-14: qty 250

## 2018-11-14 MED ORDER — SODIUM CHLORIDE 0.9 % IV SOLN
2400.0000 mg/m2 | INTRAVENOUS | Status: DC
  Administered 2018-11-14: 6450 mg via INTRAVENOUS
  Filled 2018-11-14: qty 100

## 2018-11-14 MED ORDER — DEXAMETHASONE SODIUM PHOSPHATE 10 MG/ML IJ SOLN
10.0000 mg | Freq: Once | INTRAMUSCULAR | Status: AC
  Administered 2018-11-14: 10 mg via INTRAVENOUS
  Filled 2018-11-14: qty 1

## 2018-11-14 MED ORDER — LEUCOVORIN CALCIUM INJECTION 350 MG: 400.0000 mg/m2 | Freq: Once | INTRAVENOUS | Status: DC

## 2018-11-14 MED ORDER — SODIUM CHLORIDE 0.9% FLUSH
10.0000 mL | Freq: Once | INTRAVENOUS | Status: AC
  Administered 2018-11-14: 10 mL via INTRAVENOUS
  Filled 2018-11-14: qty 10

## 2018-11-14 NOTE — Progress Notes (Signed)
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  # June rd week- 5FU pump every 2 days; +RT [until aug 4th]  # 10/16/2018-FOLFOX #1  # DM- diet controlled; Hepatitis C  # NGS/omniseq- PDL-1 CPS 20%;EGFR-copy number gain; NEG- targets/ Her-2 neu.   DIAGNOSIS: Adenocarcinoma esophagus/GE junction  STAGE: IV     ;GOALS: pallaitive  CURRENT/MOST RECENT THERAPY ; 5FU q 2 weeks+RT      Malignant neoplasm of lower third of esophagus ([place])  08/30/2018 -  Chemotherapy   The patient had palonosetron (ALOXI) injection 0.25 mg, 0.25 mg, Intravenous,  Once, 3 of 5 cycles Administration: 0.25 mg (10/30/2018), 0.25 mg (10/16/2018) leucovorin 1,072 mg in dextrose 5 % 250 mL infusion, 400 mg/m2 = 1,072 mg, Intravenous,  Once, 5 of 7 cycles Administration: 1,000 mg (10/30/2018), 1,000 mg (10/16/2018) oxaliplatin (ELOXATIN) 230 mg in dextrose 5 % 500 mL chemo infusion, 85 mg/m2 = 230 mg, Intravenous,  Once, 3 of 5 cycles Administration: 230 mg (10/30/2018), 230 mg (10/16/2018) leucovorin injection 54 mg, 20 mg/m2 = 54 mg (100 % of original dose 20 mg/m2),  Intravenous,  Once, 5 of 7 cycles Dose modification: 20 mg/m2 (original dose 20 mg/m2, Cycle 2, Reason: Other (see comments), Comment: iv push), 20 mg/m2 (original dose 20 mg/m2, Cycle 3, Reason: Other (see comments), Comment: ivp) Administration: 54 mg (09/13/2018), 54 mg (10/02/2018) fluorouracil (ADRUCIL) 6,450 mg in sodium chloride 0.9 % 121 mL chemo infusion, 2,400 mg/m2 = 6,450 mg, Intravenous, 1 Day/Dose, 6 of 8 cycles Administration: 6,450 mg (08/30/2018), 6,450 mg (09/13/2018), 6,450 mg (10/30/2018), 6,450 mg (10/02/2018), 6,450 mg (10/16/2018)  for chemotherapy treatment.      HISTORY OF PRESENTING ILLNESS:  Matthew Yang 58 y.o.  male stage IV adenocarcinoma esophagus currently on FOLFOX chemotherapy is here for follow-up.  Patient admits to mild tingling and numbness with exposure to cold.  Otherwise denies any chronic tingling numbness.  Weight overall stable.  He is able to swallow most of the foods without any difficulty.  No regurgitation no significant heartburn.  He is questioning the need for continued Protonix.   Review of Systems  Constitutional: Positive for malaise/fatigue. Negative for chills, diaphoresis and fever.  HENT: Negative for nosebleeds and sore throat.   Eyes: Negative for double vision.  Respiratory: Negative for cough, hemoptysis, sputum production, shortness of breath and wheezing.   Cardiovascular: Negative for chest pain, palpitations, orthopnea and leg swelling.  Gastrointestinal: Negative for abdominal pain, blood in stool, constipation, diarrhea, heartburn, melena and nausea.  Genitourinary: Negative for dysuria, frequency and urgency.  Musculoskeletal: Negative for back pain and joint pain.  Skin: Negative.  Negative for itching and rash.  Neurological: Positive for tingling. Negative for dizziness, focal weakness, weakness and headaches.  Endo/Heme/Allergies: Does not bruise/bleed  easily.  Psychiatric/Behavioral: Negative for depression. The patient is  not nervous/anxious and does not have insomnia.      MEDICAL HISTORY:  Past Medical History:  Diagnosis Date  . Diabetes mellitus without complication ([place])   . Esophagus cancer ([place])   . Hepatitis    HEPATITIS C  . Hypercholesteremia   . Hypertension   . Morbid obesity ([place])   . Positive hepatitis C antibody test   . Sleep apnea   . Tubular adenoma of colon    polyp    SURGICAL HISTORY: Past Surgical History:  Procedure Laterality Date  . CHOLECYSTECTOMY    . COLONOSCOPY    . COLONOSCOPY WITH PROPOFOL N/A 05/04/2016   Procedure: COLONOSCOPY WITH PROPOFOL;  Surgeon: Lollie Sails, MD;  Location[facility] ENDOSCOPY;  Service: Endoscopy;  Laterality: N/A;  . ESOPHAGOGASTRODUODENOSCOPY (EGD) WITH PROPOFOL N/A 08/04/2018   Procedure: ESOPHAGOGASTRODUODENOSCOPY (EGD) WITH PROPOFOL;  Surgeon: Lollie Sails, MD;  Location: [facility] ENDOSCOPY;  Service: Endoscopy;  Laterality: N/A;  . PORTA CATH INSERTION N/A 08/17/2018   Procedure: PORTA CATH INSERTION;  Surgeon: Algernon Huxley, MD;  Location: [facility] CV LAB;  Service: Cardiovascular;  Laterality: N/A;    SOCIAL HISTORY: Social History   Socioeconomic History  . Marital status: Married    Spouse name: Not on file  . Number of children: Not on file  . Years of education: Not on file  . Highest education level: Not on file  Occupational History  . Not on file  Social Needs  . Financial resource strain: Not on file  . Food insecurity    Worry: Not on file    Inability: Not on file  . Transportation needs    Medical: Not on file    Non-medical: Not on file  Tobacco Use  . Smoking status: Never Smoker  . Smokeless tobacco: Never Used  Substance and Sexual Activity  . Alcohol use: Not Currently  . Drug use: No  . Sexual activity: Not on file  Lifestyle  . Physical activity    Days per week: Not on file    Minutes per session: Not on file  . Stress: Not on file  Relationships  . Social Herbalist on  phone: Not on file    Gets together: Not on file    Attends religious service: Not on file    Active member of club or organization: Not on file    Attends meetings of clubs or organizations: Not on file    Relationship status: Not on file  . Intimate partner violence    Fear of current or ex partner: Not on file    Emotionally abused: Not on file    Physically abused: Not on file    Forced sexual activity: Not on file  Other Topics Concern  . 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     FAMILY HISTORY: Family History  Family history unknown: Yes    ALLERGIES:  has No Known Allergies.  MEDICATIONS:  Current Outpatient Medications  Medication Sig Dispense Refill  . atorvastatin (LIPITOR) 20 MG tablet Take 20 mg by mouth daily.    Marland Kitchen lidocaine-prilocaine (EMLA) cream Apply 1 application topically as needed. 30 g 4  . lisinopril (PRINIVIL,ZESTRIL) 20 MG tablet Take 20 mg by mouth daily.    . Multiple Vitamin (MULTIVITAMIN WITH MINERALS) TABS tablet Take 1 tablet by mouth daily.    Marland Kitchen  Omega-3 Fatty Acids (FISH OIL) 1000 MG CAPS Take 1,000 mg by mouth daily.     . ondansetron (ZOFRAN) 8 MG tablet Take 1 tablet (8 mg total) by mouth every 8 (eight) hours as needed for nausea or vomiting. 20 tablet 4  . pantoprazole (PROTONIX) 40 MG tablet Take 40 mg by mouth daily.    . prochlorperazine (COMPAZINE) 10 MG tablet Take 1 tablet (10 mg total) by mouth every 6 (six) hours as needed for nausea or vomiting. 30 tablet 4  . Turmeric 500 MG CAPS Take 500 mg by mouth daily.     No current facility-administered medications for this visit.    Facility-Administered Medications Ordered in Other Visits  Medication Dose Route Frequency Provider Last Rate Last Dose  . fluorouracil (ADRUCIL) 6,450 mg in sodium chloride 0.9 % 121 mL chemo infusion  2,400 mg/m2 (Treatment Plan Recorded) Intravenous 1 day or 1 dose Charlaine Dalton R, MD   6,450 mg at 11/14/18 1220  . heparin lock flush 100 unit/mL  500 Units Intravenous Once Charlaine Dalton R, MD          .  PHYSICAL EXAMINATION: ECOG PERFORMANCE STATUS: 1 - Symptomatic but completely ambulatory  Vitals:   11/14/18 0851  BP: 120/81  Pulse: 72  Temp: (!) 96.3 F (35.7 C)   Filed Weights   11/14/18 0851  Weight: 298 lb (135.2 kg)    Physical Exam  Constitutional: He is oriented to person, place, and time and well-developed, well-nourished, and in no distress.  HENT:  Head: Normocephalic and atraumatic.  Mouth/Throat: Oropharynx is clear and moist. No oropharyngeal exudate.  Eyes: Pupils are equal, round, and reactive to light.  Neck: Normal range of motion. Neck supple.  Cardiovascular: Normal rate and regular rhythm.  Pulmonary/Chest: Effort normal and breath sounds normal. No respiratory distress. He has no wheezes.  Abdominal: Soft. Bowel sounds are normal. He exhibits no distension and no mass. There is no abdominal tenderness. There is no rebound and no guarding.  Musculoskeletal: Normal range of motion.        General: No tenderness or edema.  Neurological: He is alert and oriented to person, place, and time.  Skin: Skin is warm.  Psychiatric: Affect normal.     LABORATORY DATA:  I have reviewed the data as listed Lab Results  Component Value Date   WBC 4.0 11/14/2018   HGB 13.5 11/14/2018   HCT 39.7 11/14/2018   MCV 91.3 11/14/2018   PLT 117 (L) 11/14/2018   Recent Labs    10/16/18 0825 10/30/18 0820 11/14/18 0828  NA 138 139 140  K 4.1 3.9 4.1  CL 105 106 108  CO2 _0 GLUCOSE 196* 159* 174*  BUN _1 CREATININE 0.84 0.89 0.84  CALCIUM 8.8* 8.7* 9.1  GFRNONAA >60 >60 >60  GFRAA >60 >60 >60  PROT 6.8 6.9 6.9  ALBUMIN 3.7 3.9 3.9  AST _2 ALT _3 ALKPHOS 94 103 125  BILITOT 1.1 1.2 1.0    RADIOGRAPHIC STUDIES: I have personally reviewed the radiological images as listed and agreed  with the findings in the report. No results found.  ASSESSMENT & PLAN:   Malignant neoplasm of lower third of esophagus ([place]) #Adenocarcinoma [poorly differentiated/question signet ring morphology] lower third of esophagus/GE junction.June 2020 PET scan-bulky lower esophageal/gastric tumor; also retroperitoneal/aortocaval lymph nodes/gastrohepatic lymph nodes.   Stage IV. S/p 5FU q2Wx4-with RT. Currently on FOLFOX.  STABLE.     #  proceed with cycle #3 of FOLFOX today. Labs today reviewed;  acceptable for treatment today. Will order imaging  PET scan [if not approved then CT scan].   # Thrombocytopenia-platelets 117.    Grade 1 from oxaliplatin.  Asymptomatic.  Monitor closely.  # Difficulty swallowing/dysphagia-secondary to malignant tumor-stable.  Reasonable to give a trial off Protonix as patient is currently not going through reflux.   # DISPOSITION: # Treatment today;pump off in 2 days.  # follow up in 2 weeks- MD; labs- cbc/cmp- FOLFOX;PET scan prior- pump of in 2 days. - Dr.B    All questions were answered. The patient knows to call the clinic with any problems, questions or concerns.    Cammie Sickle, MD 11/14/2018 1:02 PM

## 2018-11-14 NOTE — Assessment & Plan Note (Addendum)
#  Adenocarcinoma [poorly differentiated/question signet ring morphology] lower third of esophagus/GE junction.June 2020 PET scan-bulky lower esophageal/gastric tumor; also retroperitoneal/aortocaval lymph nodes/gastrohepatic lymph nodes.   Stage IV. S/p 5FU q2Wx4-with RT. Currently on FOLFOX.  STABLE.     # proceed with cycle #3 of FOLFOX today. Labs today reviewed;  acceptable for treatment today. Will order imaging  PET scan [if not approved then CT scan].   # Thrombocytopenia-platelets 117.    Grade 1 from oxaliplatin.  Asymptomatic.  Monitor closely.  # Difficulty swallowing/dysphagia-secondary to malignant tumor-stable.  Reasonable to give a trial off Protonix as patient is currently not going through reflux.   # DISPOSITION: # Treatment today;pump off in 2 days.  # follow up in 2 weeks- MD; labs- cbc/cmp- FOLFOX;PET scan prior- pump of in 2 days. - Dr.B

## 2018-11-16 ENCOUNTER — Inpatient Hospital Stay: Payer: No Typology Code available for payment source

## 2018-11-16 ENCOUNTER — Other Ambulatory Visit: Payer: Self-pay

## 2018-11-16 DIAGNOSIS — Z7189 Other specified counseling: Secondary | ICD-10-CM

## 2018-11-16 DIAGNOSIS — C155 Malignant neoplasm of lower third of esophagus: Secondary | ICD-10-CM

## 2018-11-16 DIAGNOSIS — Z5111 Encounter for antineoplastic chemotherapy: Secondary | ICD-10-CM | POA: Diagnosis not present

## 2018-11-16 MED ORDER — HEPARIN SOD (PORK) LOCK FLUSH 100 UNIT/ML IV SOLN
500.0000 [IU] | Freq: Once | INTRAVENOUS | Status: AC | PRN
  Administered 2018-11-16: 12:00:00 500 [IU]

## 2018-11-16 MED ORDER — HEPARIN SOD (PORK) LOCK FLUSH 100 UNIT/ML IV SOLN
INTRAVENOUS | Status: AC
  Filled 2018-11-16: qty 5

## 2018-11-16 MED ORDER — SODIUM CHLORIDE 0.9% FLUSH
10.0000 mL | INTRAVENOUS | Status: DC | PRN
  Administered 2018-11-16: 12:00:00 10 mL
  Filled 2018-11-16: qty 10

## 2018-11-20 ENCOUNTER — Inpatient Hospital Stay: Payer: No Typology Code available for payment source

## 2018-11-20 ENCOUNTER — Ambulatory Visit
Admission: RE | Admit: 2018-11-20 | Discharge: 2018-11-20 | Disposition: A | Discharge status: Home / Self Care | Payer: No Typology Code available for payment source | Source: Ambulatory Visit | Attending: Oncology | Admitting: Oncology

## 2018-11-20 ENCOUNTER — Telehealth: Payer: Self-pay | Admitting: *Deleted

## 2018-11-20 ENCOUNTER — Ambulatory Visit: Payer: No Typology Code available for payment source

## 2018-11-20 ENCOUNTER — Other Ambulatory Visit: Payer: Self-pay

## 2018-11-20 ENCOUNTER — Inpatient Hospital Stay (HOSPITAL_BASED_OUTPATIENT_CLINIC_OR_DEPARTMENT_OTHER): Payer: No Typology Code available for payment source | Admitting: Oncology

## 2018-11-20 VITALS — BP 114/84 | HR 102 | Temp 97.6°F | Resp 22 | Wt 289.4 lb

## 2018-11-20 DIAGNOSIS — R109 Unspecified abdominal pain: Secondary | ICD-10-CM

## 2018-11-20 DIAGNOSIS — Z5189 Encounter for other specified aftercare: Secondary | ICD-10-CM

## 2018-11-20 DIAGNOSIS — R1310 Dysphagia, unspecified: Secondary | ICD-10-CM

## 2018-11-20 DIAGNOSIS — E860 Dehydration: Secondary | ICD-10-CM

## 2018-11-20 DIAGNOSIS — C155 Malignant neoplasm of lower third of esophagus: Secondary | ICD-10-CM | POA: Diagnosis not present

## 2018-11-20 DIAGNOSIS — R630 Anorexia: Secondary | ICD-10-CM

## 2018-11-20 DIAGNOSIS — Z95828 Presence of other vascular implants and grafts: Secondary | ICD-10-CM

## 2018-11-20 LAB — COMPREHENSIVE METABOLIC PANEL
ALT: 52 U/L — ABNORMAL HIGH (ref 0–44)
AST: 41 U/L (ref 15–41)
Albumin: 4.4 g/dL (ref 3.5–5.0)
Alkaline Phosphatase: 104 U/L (ref 38–126)
Anion gap: 9 (ref 5–15)
BUN: 19 mg/dL (ref 6–20)
CO2: 24 mmol/L (ref 22–32)
Calcium: 9.3 mg/dL (ref 8.9–10.3)
Chloride: 105 mmol/L (ref 98–111)
Creatinine, Ser: 0.84 mg/dL (ref 0.61–1.24)
GFR calc Af Amer: >60 mL/min (ref >60)
GFR calc non Af Amer: >60 mL/min (ref >60)
Glucose, Bld: 158 mg/dL — ABNORMAL HIGH (ref 70–99)
Potassium: 4 mmol/L (ref 3.5–5.1)
Sodium: 138 mmol/L (ref 135–145)
Total Bilirubin: 1.9 mg/dL — ABNORMAL HIGH (ref 0.3–1.2)
Total Protein: 8.1 g/dL (ref 6.5–8.1)

## 2018-11-20 LAB — CBC WITH DIFFERENTIAL/PLATELET
Abs Immature Granulocytes: 0.01 10*3/uL (ref 0.00–0.07)
Basophils Absolute: 0 10*3/uL (ref 0.0–0.1)
Basophils Relative: 0 %
Eosinophils Absolute: 0.1 10*3/uL (ref 0.0–0.5)
Eosinophils Relative: 3 %
HCT: 42.9 % (ref 39.0–52.0)
Hemoglobin: 14.8 g/dL (ref 13.0–17.0)
Immature Granulocytes: 0 %
Lymphocytes Relative: 17 %
Lymphs Abs: 0.8 10*3/uL (ref 0.7–4.0)
MCH: 31 pg (ref 26.0–34.0)
MCHC: 34.5 g/dL (ref 30.0–36.0)
MCV: 89.7 fL (ref 80.0–100.0)
Monocytes Absolute: 0.8 10*3/uL (ref 0.1–1.0)
Monocytes Relative: 18 %
Neutro Abs: 2.8 10*3/uL (ref 1.7–7.7)
Neutrophils Relative %: 62 %
Platelets: 155 10*3/uL (ref 150–400)
RBC: 4.78 MIL/uL (ref 4.22–5.81)
RDW: 13.8 % (ref 11.5–15.5)
WBC: 4.5 10*3/uL (ref 4.0–10.5)
nRBC: 0 % (ref 0.0–0.2)

## 2018-11-20 LAB — URINALYSIS, COMPLETE (UACMP) WITH MICROSCOPIC
Bacteria, UA: NONE SEEN
Bilirubin Urine: NEGATIVE
Glucose, UA: 150 mg/dL — AB
Ketones, ur: NEGATIVE mg/dL
Leukocytes,Ua: NEGATIVE
Nitrite: NEGATIVE
Protein, ur: NEGATIVE mg/dL
RBC / HPF: >50 RBC/hpf — ABNORMAL HIGH (ref 0–5)
Specific Gravity, Urine: 1.021 (ref 1.005–1.030)
pH: 5 (ref 5.0–8.0)

## 2018-11-20 MED ORDER — SODIUM CHLORIDE 0.9% FLUSH
10.0000 mL | Freq: Once | INTRAVENOUS | Status: AC
  Administered 2018-11-20: 10 mL via INTRAVENOUS
  Filled 2018-11-20: qty 10

## 2018-11-20 MED ORDER — IOHEXOL 300 MG/ML  SOLN
100.0000 mL | Freq: Once | INTRAMUSCULAR | Status: AC | PRN
  Administered 2018-11-20: 100 mL via INTRAVENOUS

## 2018-11-20 MED ORDER — SODIUM CHLORIDE 0.9 % IV SOLN
Freq: Once | INTRAVENOUS | Status: AC
  Administered 2018-11-20: 10:00:00 via INTRAVENOUS
  Filled 2018-11-20: qty 250

## 2018-11-20 MED ORDER — SODIUM CHLORIDE 0.9% FLUSH
10.0000 mL | INTRAVENOUS | Status: DC | PRN
  Filled 2018-11-20: qty 10

## 2018-11-20 MED ORDER — HEPARIN SOD (PORK) LOCK FLUSH 100 UNIT/ML IV SOLN
500.0000 [IU] | Freq: Once | INTRAVENOUS | Status: AC
  Administered 2018-11-20: 500 [IU] via INTRAVENOUS

## 2018-11-20 NOTE — Progress Notes (Signed)
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  # June rd week- 5FU pump every 2 days; +RT [until aug 4th]  # 10/16/2018-FOLFOX #1  # DM- diet controlled; Hepatitis C  # NGS/omniseq- PDL-1 CPS 20%;EGFR-copy number gain; NEG- targets/ Her-2 neu.   DIAGNOSIS: Adenocarcinoma esophagus/GE junction  STAGE: IV     ;GOALS: pallaitive  CURRENT/MOST RECENT THERAPY ; 5FU q 2 weeks+RT      Malignant neoplasm of lower third of esophagus ([place])  08/30/2018 -  Chemotherapy   The patient had palonosetron (ALOXI) injection 0.25 mg, 0.25 mg, Intravenous,  Once, 3 of 5 cycles Administration: 0.25 mg (10/30/2018), 0.25 mg (10/16/2018), 0.25 mg (11/14/2018) leucovorin 1,072 mg in dextrose 5 % 250 mL infusion, 400 mg/m2 = 1,072 mg, Intravenous,  Once, 5 of 7 cycles Administration: 1,000 mg (10/30/2018), 1,000 mg (10/16/2018) oxaliplatin (ELOXATIN) 230 mg  in dextrose 5 % 500 mL chemo infusion, 85 mg/m2 = 230 mg, Intravenous,  Once, 3 of 5 cycles Administration: 230 mg (10/30/2018), 230 mg (10/16/2018), 230 mg (11/14/2018) leucovorin injection 54 mg, 20 mg/m2 = 54 mg (100 % of original dose 20 mg/m2), Intravenous,  Once, 5 of 7 cycles Dose modification: 20 mg/m2 (original dose 20 mg/m2, Cycle 2, Reason: Other (see comments), Comment: iv push), 20 mg/m2 (original dose 20 mg/m2, Cycle 3, Reason: Other (see comments), Comment: ivp) Administration: 54 mg (09/13/2018), 54 mg (10/02/2018) fluorouracil (ADRUCIL) 6,450 mg in sodium chloride 0.9 % 121 mL chemo infusion, 2,400 mg/m2 = 6,450 mg, Intravenous, 1 Day/Dose, 6 of 8 cycles Administration: 6,450 mg (08/30/2018), 6,450 mg (09/13/2018), 6,450 mg (10/30/2018), 6,450 mg (10/02/2018), 6,450 mg (10/16/2018), 6,450 mg (11/14/2018)  for chemotherapy treatment.      Interval history-patient presents to [facility] today for complaints of dysphasia/regurgitation X 2 days.  He received cycle 3 FOLFOX last on 11/14/2018.  At that time he felt well and denied any dysphasia.  He was able to eat soft foods and liquids.  He had mild neuropathy.  He denied any regurgitation or heartburn.  His Protonix was stopped.   On Saturday, he developed dysphagia/regurgitation after consuming 2 bites of pulled pork.  He has been unable to tolerate foods or liquids since.  He has attempted on several occasions to drink water which unfortunately causes him to vomit.  He has had 1 episode of right lower quadrant pain radiating to his flank.  Has chronic intermittent left upper quadrant pain that is been present since his diagnosis. He denies any fever, illness, bleeding, bruising, chest pain, shortness of breath or urinary concerns.  He has  had a few episodes of nausea with regurgitation causing him to vomit.  He denies constipation and had a bowel movement this morning.  He was evaluated in [facility] urgent care on 10/08/2018 and again on 10/10/2018 for a laceration  to the dorsum of his left hand.  Required 3 stitches.  Appeared to be healing well.  Started on Amoxil with clavulanate for 7 days.  ECOG FS:1 - Symptomatic but completely ambulatory  Review of systems- Review of Systems  Constitutional: Positive for malaise/fatigue. Negative for chills, fever and weight loss.  HENT: Negative for congestion, ear pain and tinnitus.   Eyes: Negative.  Negative for blurred vision and double vision.  Respiratory: Negative.  Negative for cough, sputum production and shortness of breath.   Cardiovascular: Negative.  Negative for chest pain, palpitations and leg swelling.  Gastrointestinal: Positive for abdominal pain (RLQ and LUQ), nausea and vomiting. Negative for constipation and diarrhea.       Regurgitation  Genitourinary: Negative for dysuria, frequency and urgency.  Musculoskeletal: Negative for back pain and falls.  Skin: Negative.  Negative for rash.  Neurological: Negative.  Negative for weakness and headaches.  Endo/Heme/Allergies: Negative.  Does not bruise/bleed easily.  Psychiatric/Behavioral: Negative.  Negative for depression. The patient is not nervous/anxious and does not have insomnia.      Current treatment- s/p 3 cycles of FOLFOX.  Last given on 11/14/2018.  No Known Allergies   Past Medical History:  Diagnosis Date   Diabetes mellitus without complication ([place])    Esophagus cancer ([place])    Hepatitis    HEPATITIS C   Hypercholesteremia    Hypertension    Morbid obesity ([place])    Positive hepatitis C antibody test    Sleep apnea    Tubular adenoma of colon    polyp     Past Surgical History:  Procedure Laterality Date   CHOLECYSTECTOMY     COLONOSCOPY     COLONOSCOPY WITH PROPOFOL N/A 05/04/2016   Procedure: COLONOSCOPY WITH PROPOFOL;  Surgeon: Lollie Sails, MD;  Location: [facility] ENDOSCOPY;  Service: Endoscopy;  Laterality: N/A;   ESOPHAGOGASTRODUODENOSCOPY (EGD) WITH PROPOFOL N/A 08/04/2018   Procedure:  ESOPHAGOGASTRODUODENOSCOPY (EGD) WITH PROPOFOL;  Surgeon: Lollie Sails, MD;  Location: [facility] ENDOSCOPY;  Service: Endoscopy;  Laterality: N/A;   PORTA CATH INSERTION N/A 08/17/2018   Procedure: PORTA CATH INSERTION;  Surgeon: Algernon Huxley, MD;  Location: [place] CV LAB;  Service: Cardiovascular;  Laterality: N/A;    Social History   Socioeconomic History   Marital status: Married    Spouse name: Not on file   Number of children: Not on file   Years of education: Not on file   Highest education level: Not on file  Occupational History   Not on file  Social Needs   Financial resource strain: Not on file   Food insecurity    Worry: Not on file    Inability: Not on file   Transportation needs    Medical: Not on file    Non-medical: Not on file  Tobacco Use   Smoking status: Never Smoker   Smokeless tobacco: Never Used  Substance and Sexual Activity   Alcohol use: Not Currently   Drug use: No   Sexual activity: Not on file  Lifestyle   Physical activity    Days per week: Not on file    Minutes per session: Not on file   Stress: Not on file  Relationships   Social connections  Talks on phone: Not on file    Gets together: Not on file    Attends religious service: Not on file    Active member of club or organization: Not on file    Attends meetings of clubs or organizations: Not on file    Relationship status: Not on file   Intimate partner violence    Fear of current or ex partner: Not on file    Emotionally abused: Not on file    Physically abused: Not on file    Forced sexual activity: Not on file  Other Topics Concern   Not on file  Social History Narrative   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     Family History  Family history unknown: Yes     Current Outpatient Medications:    atorvastatin (LIPITOR) 20 MG tablet, Take 20 mg by mouth daily., Disp: , Rfl:    lidocaine-prilocaine (EMLA)  cream, Apply 1 application topically as needed., Disp: 30 g, Rfl: 4   lisinopril (PRINIVIL,ZESTRIL) 20 MG tablet, Take 20 mg by mouth daily., Disp: , Rfl:    Multiple Vitamin (MULTIVITAMIN WITH MINERALS) TABS tablet, Take 1 tablet by mouth daily., Disp: , Rfl:    Omega-3 Fatty Acids (FISH OIL) 1000 MG CAPS, Take 1,000 mg by mouth daily. , Disp: , Rfl:    ondansetron (ZOFRAN) 8 MG tablet, Take 1 tablet (8 mg total) by mouth every 8 (eight) hours as needed for nausea or vomiting., Disp: 20 tablet, Rfl: 4   pantoprazole (PROTONIX) 40 MG tablet, Take 40 mg by mouth daily., Disp: , Rfl:    prochlorperazine (COMPAZINE) 10 MG tablet, Take 1 tablet (10 mg total) by mouth every 6 (six) hours as needed for nausea or vomiting., Disp: 30 tablet, Rfl: 4   Turmeric 500 MG CAPS, Take 500 mg by mouth daily., Disp: , Rfl:  No current facility-administered medications for this visit.   Facility-Administered Medications Ordered in Other Visits:    heparin lock flush 100 unit/mL, 500 Units, Intravenous, Once, Flor Houdeshell, Anderson Malta E, NP   sodium chloride flush (NS) 0.9 % injection 10 mL, 10 mL, Intravenous, PRN, Jacquelin Hawking, NP  Physical exam:  Vitals:   11/20/18 1005  BP: 114/84  Pulse: (!) 102  Resp: (!) 22  Temp: 97.6 F (36.4 C)  TempSrc: Tympanic  SpO2: 97%  Weight: 289 lb 6.4 oz (131.3 kg)   Physical Exam Vitals reviewed: obese.  Constitutional:      Appearance: Normal appearance.  HENT:     Head: Normocephalic and atraumatic.  Eyes:     Pupils: Pupils are equal, round, and reactive to light.  Neck:     Musculoskeletal: Normal range of motion.  Cardiovascular:     Rate and Rhythm: Normal rate and regular rhythm.     Heart sounds: Normal heart sounds. No murmur.  Pulmonary:     Effort: Pulmonary effort is normal.     Breath sounds: Normal breath sounds. No wheezing.  Abdominal:     General: Bowel sounds are normal. There is no distension.     Palpations: Abdomen is soft.      Tenderness: There is abdominal tenderness in the left upper quadrant.  Musculoskeletal: Normal range of motion.  Skin:    General: Skin is warm and dry.     Findings: No rash.  Neurological:     Mental Status: He is alert and oriented to person, place, and time.  Psychiatric:  Judgment: Judgment normal.      CMP Latest Ref Rng & Units 11/20/2018  Glucose 70 - 99 mg/dL 158(H)  BUN 6 - 20 mg/dL 19  Creatinine 0.61 - 1.24 mg/dL 0.84  Sodium 135 - 145 mmol/L 138  Potassium 3.5 - 5.1 mmol/L 4.0  Chloride 98 - 111 mmol/L 105  CO2 22 - 32 mmol/L 24  Calcium 8.9 - 10.3 mg/dL 9.3  Total Protein 6.5 - 8.1 g/dL 8.1  Total Bilirubin 0.3 - 1.2 mg/dL 1.9(H)  Alkaline Phos 38 - 126 U/L 104  AST 15 - 41 U/L 41  ALT 0 - 44 U/L 52(H)   CBC Latest Ref Rng & Units 11/20/2018  WBC 4.0 - 10.5 K/uL 4.5  Hemoglobin 13.0 - 17.0 g/dL 14.8  Hematocrit 39.0 - 52.0 % 42.9  Platelets 150 - 400 K/uL 155    No images are attached to the encounter.  Ct Chest W Contrast  Result Date: 11/20/2018 CLINICAL DATA:  58 year old male with right lower quadrant abdominal pain. History of esophageal cancer being treated with radiation and chemotherapy. EXAM: CT CHEST, ABDOMEN, AND PELVIS WITH CONTRAST TECHNIQUE: Multidetector CT imaging of the chest, abdomen and pelvis was performed following the standard protocol during bolus administration of intravenous contrast. CONTRAST:  11m OMNIPAQUE IOHEXOL 300 MG/ML  SOLN COMPARISON:  CT of the chest abdomen pelvis dated 08/09/2018 FINDINGS: CT CHEST FINDINGS Cardiovascular: There is no cardiomegaly or pericardial effusion. Multi vessel coronary vascular calcification and calcification of the mitral annulus. Mild atherosclerotic calcification of the thoracic aorta. The central pulmonary arteries appear unremarkable the degree of opacification. Right-sided Port-A-Cath with tip at the cavoatrial junction. Mediastinum/Nodes: There is no hilar or mediastinal adenopathy. There  is a small hiatal hernia. Heterogeneous content in the distal esophagus, likely ingested matter. Evaluation of the esophagus and gastroesophageal junction and stomach is limited in the absence of oral contrast. However, the previously seen masslike thickening of the gastroesophageal junction is not appreciated on today's scan. The thyroid gland is grossly unremarkable. No mediastinal fluid collection. Lungs/Pleura: The lungs are clear. There is no pleural effusion pneumothorax. The central airways are patent. Musculoskeletal: There is degenerative changes of the spine. No acute osseous pathology. No suspicious bone lesions. CT ABDOMEN PELVIS FINDINGS No intra-abdominal free air or free fluid. Hepatobiliary: Cirrhosis. No intrahepatic biliary ductal dilatation. Cholecystectomy. No retained calcified stone noted in the central CBD. Pancreas: Unremarkable. No pancreatic ductal dilatation or surrounding inflammatory changes. Spleen: Normal in size without focal abnormality. Adrenals/Urinary Tract: The adrenal glands are unremarkable. There is a 2 mm stone along the posterior wall of the urinary bladder adjacent to the right ureterovesical junction which may represent a recently passed right renal calculus versus a right UVJ stone. There is mild right hydronephrosis. There is a 2 mm nonobstructing left renal upper pole calculus. There is no hydronephrosis on the left. The urinary bladder is predominantly collapsed. Stomach/Bowel: Evaluation of the bowel is limited in the absence of oral contrast. Mild haziness of the fat surrounding the gastroesophageal junction. There is no bowel obstruction or active inflammation. The appendix is normal. Vascular/Lymphatic: The abdominal aorta and IVC are unremarkable. No portal venous gas. Mildly enlarged gastrohepatic space lymph node measures approximately 12 mm. There is slight haziness of the fat surrounding this lymph node. Overall significant interval improvement in the  previously seen lymphadenopathy in the gastrohepatic space. No new adenopathy. Reproductive: The prostate and seminal vesicles are grossly unremarkable. No pelvic mass. Other: None Musculoskeletal: Degenerative changes of the  spine. No acute osseous pathology. No suspicious osseous lesions. IMPRESSION: 1. A 2 mm recently passed right renal calculus versus a right UVJ stone with mild right hydronephrosis. 2. A 2 mm nonobstructing left renal upper pole calculus. No hydronephrosis. 3. Significant interval improvement of the previously seen masslike thickening of the gastroesophageal junction and adenopathy seen in the gastrohepatic space on the prior CT. No evidence of metastatic disease. 4. Cirrhosis. 5. No bowel obstruction or active inflammation. Normal appendix. 6. Aortic Atherosclerosis (ICD10-I70.0). Electronically Signed   By: Anner Crete M.D.   On: 11/20/2018 12:30   Ct Abdomen Pelvis W Contrast  Result Date: 11/20/2018 CLINICAL DATA:  58 year old male with right lower quadrant abdominal pain. History of esophageal cancer being treated with radiation and chemotherapy. EXAM: CT CHEST, ABDOMEN, AND PELVIS WITH CONTRAST TECHNIQUE: Multidetector CT imaging of the chest, abdomen and pelvis was performed following the standard protocol during bolus administration of intravenous contrast. CONTRAST:  113m OMNIPAQUE IOHEXOL 300 MG/ML  SOLN COMPARISON:  CT of the chest abdomen pelvis dated 08/09/2018 FINDINGS: CT CHEST FINDINGS Cardiovascular: There is no cardiomegaly or pericardial effusion. Multi vessel coronary vascular calcification and calcification of the mitral annulus. Mild atherosclerotic calcification of the thoracic aorta. The central pulmonary arteries appear unremarkable the degree of opacification. Right-sided Port-A-Cath with tip at the cavoatrial junction. Mediastinum/Nodes: There is no hilar or mediastinal adenopathy. There is a small hiatal hernia. Heterogeneous content in the distal  esophagus, likely ingested matter. Evaluation of the esophagus and gastroesophageal junction and stomach is limited in the absence of oral contrast. However, the previously seen masslike thickening of the gastroesophageal junction is not appreciated on today's scan. The thyroid gland is grossly unremarkable. No mediastinal fluid collection. Lungs/Pleura: The lungs are clear. There is no pleural effusion pneumothorax. The central airways are patent. Musculoskeletal: There is degenerative changes of the spine. No acute osseous pathology. No suspicious bone lesions. CT ABDOMEN PELVIS FINDINGS No intra-abdominal free air or free fluid. Hepatobiliary: Cirrhosis. No intrahepatic biliary ductal dilatation. Cholecystectomy. No retained calcified stone noted in the central CBD. Pancreas: Unremarkable. No pancreatic ductal dilatation or surrounding inflammatory changes. Spleen: Normal in size without focal abnormality. Adrenals/Urinary Tract: The adrenal glands are unremarkable. There is a 2 mm stone along the posterior wall of the urinary bladder adjacent to the right ureterovesical junction which may represent a recently passed right renal calculus versus a right UVJ stone. There is mild right hydronephrosis. There is a 2 mm nonobstructing left renal upper pole calculus. There is no hydronephrosis on the left. The urinary bladder is predominantly collapsed. Stomach/Bowel: Evaluation of the bowel is limited in the absence of oral contrast. Mild haziness of the fat surrounding the gastroesophageal junction. There is no bowel obstruction or active inflammation. The appendix is normal. Vascular/Lymphatic: The abdominal aorta and IVC are unremarkable. No portal venous gas. Mildly enlarged gastrohepatic space lymph node measures approximately 12 mm. There is slight haziness of the fat surrounding this lymph node. Overall significant interval improvement in the previously seen lymphadenopathy in the gastrohepatic space. No new  adenopathy. Reproductive: The prostate and seminal vesicles are grossly unremarkable. No pelvic mass. Other: None Musculoskeletal: Degenerative changes of the spine. No acute osseous pathology. No suspicious osseous lesions. IMPRESSION: 1. A 2 mm recently passed right renal calculus versus a right UVJ stone with mild right hydronephrosis. 2. A 2 mm nonobstructing left renal upper pole calculus. No hydronephrosis. 3. Significant interval improvement of the previously seen masslike thickening of the gastroesophageal junction and  adenopathy seen in the gastrohepatic space on the prior CT. No evidence of metastatic disease. 4. Cirrhosis. 5. No bowel obstruction or active inflammation. Normal appendix. 6. Aortic Atherosclerosis (ICD10-I70.0). Electronically Signed   By: Anner Crete M.D.   On: 11/20/2018 12:30   Assessment and plan- Patient is a 58 y.o. male who presents to symptom management for complaints of dysphagia/regurgitation x2 days.  Lower third esophageal cancer: Status post 3 cycles of FOLFOX.  He also has completed radiation.  He has been doing well and tolerating treatments with only mild side effects.  He is scheduled for Pet Scan on Wednesday.   Right lower quadrant abdominal pain: Likely due to recently passed 2 mm kidney stone.  Resolved.  Left upper quadrant abdominal pain: Chronic.  Likely due to known malignancy.  Intermittent.  Regurgitation/dysphasia: Unclear etiology.  Could be progression of disease or foreign body obstruction due to food.  He is stable.   Plan: Give 1 L NaCl. Stat CT abdomen/pelvis/chest. Improving disease and recently passed 54m right kidney stone.  Urinalysis stat.  Large amounts of blood present.  Spoke to Dr. SGustavo Lahvis secure chat he recomends ED evaluation to be seen by on-call GI or an appointment at his office.  If emergent, he recommends ED for expedited EGD.  An urgent EGD, would likely require admission.  Patient would like to remain outpatient  if possible.  Given labs and vital signs are stable, I am okay with him going home.  He was instructed to report directly to the emergency room if symptoms worsened. Plan discussed with Dr. BRogue Bussing   [facility]position: [facility] 1[facility]30[facility]. RTC on 11/22/2018 for PET scan. RTC on 11/30/2018 for MD assessment, labs and consideration of cycle 7 FOLFOX. Patient knows to report to emergency room if symptoms worsen.   Visit Diagnosis 1. Malignant neoplasm of lower third of esophagus ([facility])   2. Dysphagia, unspecified type     Patient expressed understanding and was in agreement with this plan. He also understands that He can call clinic at any time with any questions, concerns, or complaints.   Greater than 50% was spent in counseling and coordination of care with this patient including but not limited to discussion of the relevant topics above (See A&P) including, but not limited to diagnosis and management of acute and chronic medical conditions.   Thank you for allowing me to participate in the care of this very pleasant patient.    JJacquelin Hawking NP [facility]at A[facility] HospitalCell - [phone]- [phone]/14/2020 2:05 PM

## 2018-11-20 NOTE — Telephone Encounter (Signed)
Message from A Grogan, RT Good Morning..I just received a phone call from this patient. He asked to speak to a nurse do he can see Dr. Jacinto Reap becuase he is unable to eat/drink and thinks he may be dehydrated. Will you please give him a call. Thanks.  And then from Dr B please have him see symtom management-; labs- cbc/cmp-Thx all.   Patient accepts appointment for 945 AM

## 2018-11-21 LAB — URINE CULTURE: Culture: NO GROWTH

## 2018-11-22 ENCOUNTER — Ambulatory Visit: Payer: No Typology Code available for payment source

## 2018-11-23 ENCOUNTER — Encounter: Payer: Self-pay | Admitting: Oncology

## 2018-11-23 NOTE — Progress Notes (Unsigned)
Called patient to follow-up regarding his dysphagia and f/u with Dr. Gustavo Lah.  Patient states he canceled his appointment on Tuesday with Dr. Marton [facility] clinic because he was able to begin consuming water on Tuesday morning.  He states he became very dehydrated on Tuesday morning and attempted to drink water which surprisingly went down easily.  He was also able to eat yogurt.  Since then he has had no trouble swallowing.  He has a scheduled PET scan on Monday, 11/27/2018 and follow-up with Dr. Rogue Bussing on Tuesday, 11/28/2018.  Dr. Rogue Bussing updated.  Faythe Casa, NP 11/23/2018 1:48 PM

## 2018-11-27 ENCOUNTER — Encounter
Admission: RE | Admit: 2018-11-27 | Discharge: 2018-11-27 | Disposition: A | Discharge status: Home / Self Care | Payer: No Typology Code available for payment source | Source: Ambulatory Visit | Attending: Internal Medicine | Admitting: Internal Medicine

## 2018-11-27 ENCOUNTER — Other Ambulatory Visit: Payer: Self-pay

## 2018-11-27 DIAGNOSIS — C155 Malignant neoplasm of lower third of esophagus: Secondary | ICD-10-CM | POA: Insufficient documentation

## 2018-11-27 LAB — GLUCOSE, CAPILLARY: Glucose-Capillary: 100 mg/dL — ABNORMAL HIGH (ref 70–99)

## 2018-11-27 MED ORDER — FLUDEOXYGLUCOSE F - 18 (FDG) INJECTION
14.5000 | Freq: Once | INTRAVENOUS | Status: AC | PRN
  Administered 2018-11-27: 14.5 via INTRAVENOUS

## 2018-11-28 ENCOUNTER — Inpatient Hospital Stay: Payer: No Typology Code available for payment source

## 2018-11-28 ENCOUNTER — Inpatient Hospital Stay (HOSPITAL_BASED_OUTPATIENT_CLINIC_OR_DEPARTMENT_OTHER): Payer: No Typology Code available for payment source | Admitting: Internal Medicine

## 2018-11-28 ENCOUNTER — Other Ambulatory Visit: Payer: Self-pay

## 2018-11-28 ENCOUNTER — Encounter: Payer: Self-pay | Admitting: Internal Medicine

## 2018-11-28 DIAGNOSIS — C155 Malignant neoplasm of lower third of esophagus: Secondary | ICD-10-CM

## 2018-11-28 DIAGNOSIS — Z95828 Presence of other vascular implants and grafts: Secondary | ICD-10-CM

## 2018-11-28 DIAGNOSIS — Z5111 Encounter for antineoplastic chemotherapy: Secondary | ICD-10-CM | POA: Diagnosis not present

## 2018-11-28 DIAGNOSIS — Z7189 Other specified counseling: Secondary | ICD-10-CM

## 2018-11-28 LAB — CBC WITH DIFFERENTIAL/PLATELET
Abs Immature Granulocytes: 0.01 10*3/uL (ref 0.00–0.07)
Basophils Absolute: 0 10*3/uL (ref 0.0–0.1)
Basophils Relative: 1 %
Eosinophils Absolute: 0.3 10*3/uL (ref 0.0–0.5)
Eosinophils Relative: 7 %
HCT: 36.6 % — ABNORMAL LOW (ref 39.0–52.0)
Hemoglobin: 12.7 g/dL — ABNORMAL LOW (ref 13.0–17.0)
Immature Granulocytes: 0 %
Lymphocytes Relative: 14 %
Lymphs Abs: 0.6 10*3/uL — ABNORMAL LOW (ref 0.7–4.0)
MCH: 31.2 pg (ref 26.0–34.0)
MCHC: 34.7 g/dL (ref 30.0–36.0)
MCV: 89.9 fL (ref 80.0–100.0)
Monocytes Absolute: 0.8 10*3/uL (ref 0.1–1.0)
Monocytes Relative: 18 %
Neutro Abs: 2.6 10*3/uL (ref 1.7–7.7)
Neutrophils Relative %: 60 %
Platelets: 94 10*3/uL — ABNORMAL LOW (ref 150–400)
RBC: 4.07 MIL/uL — ABNORMAL LOW (ref 4.22–5.81)
RDW: 14.7 % (ref 11.5–15.5)
WBC: 4.3 10*3/uL (ref 4.0–10.5)
nRBC: 0 % (ref 0.0–0.2)

## 2018-11-28 LAB — COMPREHENSIVE METABOLIC PANEL
ALT: 29 U/L (ref 0–44)
AST: 27 U/L (ref 15–41)
Albumin: 3.5 g/dL (ref 3.5–5.0)
Alkaline Phosphatase: 106 U/L (ref 38–126)
Anion gap: 6 (ref 5–15)
BUN: 14 mg/dL (ref 6–20)
CO2: 24 mmol/L (ref 22–32)
Calcium: 8.6 mg/dL — ABNORMAL LOW (ref 8.9–10.3)
Chloride: 109 mmol/L (ref 98–111)
Creatinine, Ser: 0.83 mg/dL (ref 0.61–1.24)
GFR calc Af Amer: >60 mL/min (ref >60)
GFR calc non Af Amer: >60 mL/min (ref >60)
Glucose, Bld: 198 mg/dL — ABNORMAL HIGH (ref 70–99)
Potassium: 3.7 mmol/L (ref 3.5–5.1)
Sodium: 139 mmol/L (ref 135–145)
Total Bilirubin: 1 mg/dL (ref 0.3–1.2)
Total Protein: 6.5 g/dL (ref 6.5–8.1)

## 2018-11-28 MED ORDER — OXALIPLATIN CHEMO INJECTION 100 MG/20ML
60.0000 mg/m2 | Freq: Once | INTRAVENOUS | Status: AC
  Administered 2018-11-28: 11:00:00 160 mg via INTRAVENOUS
  Filled 2018-11-28: qty 32

## 2018-11-28 MED ORDER — SODIUM CHLORIDE 0.9% FLUSH
10.0000 mL | Freq: Once | INTRAVENOUS | Status: AC
  Administered 2018-11-28: 08:00:00 10 mL via INTRAVENOUS
  Filled 2018-11-28: qty 10

## 2018-11-28 MED ORDER — DEXTROSE 5 % IV SOLN
Freq: Once | INTRAVENOUS | Status: AC
  Administered 2018-11-28: 10:00:00 via INTRAVENOUS
  Filled 2018-11-28: qty 250

## 2018-11-28 MED ORDER — SODIUM CHLORIDE 0.9 % IV SOLN
2400.0000 mg/m2 | INTRAVENOUS | Status: DC
  Administered 2018-11-28: 13:00:00 6450 mg via INTRAVENOUS
  Filled 2018-11-28: qty 100

## 2018-11-28 MED ORDER — PALONOSETRON HCL INJECTION 0.25 MG/5ML
0.2500 mg | Freq: Once | INTRAVENOUS | Status: AC
  Administered 2018-11-28: 10:00:00 0.25 mg via INTRAVENOUS
  Filled 2018-11-28: qty 5

## 2018-11-28 MED ORDER — DEXAMETHASONE SODIUM PHOSPHATE 10 MG/ML IJ SOLN
10.0000 mg | Freq: Once | INTRAMUSCULAR | Status: AC
  Administered 2018-11-28: 10:00:00 10 mg via INTRAVENOUS
  Filled 2018-11-28: qty 1

## 2018-11-28 MED ORDER — LEUCOVORIN CALCIUM INJECTION 350 MG
373.0000 mg/m2 | Freq: Once | INTRAVENOUS | Status: AC
  Administered 2018-11-28: 11:00:00 1000 mg via INTRAVENOUS
  Filled 2018-11-28: qty 50

## 2018-11-28 NOTE — Progress Notes (Signed)
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  # June rd week- 5FU pump every 2 days; +RT [until aug 4th]  # 10/16/2018-FOLFOX #1; 9/21-PET- PR. 11/28/2018- decreased the ox dose to 60 mg/2 [sec to platelets- 94].   # DM- diet controlled; Hepatitis C  # NGS/omniseq- PDL-1 CPS 20%;EGFR-copy number gain; NEG- targets/ Her-2 neu.   DIAGNOSIS: Adenocarcinoma esophagus/GE junction  STAGE: IV     ;GOALS: pallaitive  CURRENT/MOST RECENT THERAPY ; 5FU q 2 weeks+RT      Malignant neoplasm of lower third of esophagus ([place])  08/30/2018 -  Chemotherapy   The patient had palonosetron (ALOXI) injection 0.25 mg, 0.25 mg, Intravenous,  Once, 4 of 6 cycles Administration: 0.25 mg (10/30/2018), 0.25 mg (10/16/2018), 0.25 mg (11/14/2018) leucovorin 1,072 mg in dextrose 5 % 250 mL infusion, 400 mg/m2 = 1,072 mg, Intravenous,  Once, 6 of 8 cycles Administration: 1,000 mg (10/30/2018), 1,000 mg (10/16/2018) oxaliplatin (ELOXATIN) 230 mg in dextrose 5 % 500 mL chemo infusion, 85 mg/m2 = 230 mg, Intravenous,  Once, 4 of 6 cycles Dose modification: 60 mg/m2 (original  dose 85 mg/m2, Cycle 7, Reason: Provider Judgment) Administration: 230 mg (10/30/2018), 230 mg (10/16/2018), 230 mg (11/14/2018) leucovorin injection 54 mg, 20 mg/m2 = 54 mg (100 % of original dose 20 mg/m2), Intravenous,  Once, 6 of 8 cycles Dose modification: 20 mg/m2 (original dose 20 mg/m2, Cycle 2, Reason: Other (see comments), Comment: iv push), 20 mg/m2 (original dose 20 mg/m2, Cycle 3, Reason: Other (see comments), Comment: ivp) Administration: 54 mg (09/13/2018), 54 mg (10/02/2018) fluorouracil (ADRUCIL) 6,450 mg in sodium chloride 0.9 % 121 mL chemo infusion, 2,400 mg/m2 = 6,450 mg, Intravenous, 1 Day/Dose, 7 of 9 cycles Administration: 6,450 mg (08/30/2018), 6,450 mg (09/13/2018), 6,450 mg (10/30/2018), 6,450 mg (10/02/2018), 6,450 mg (10/16/2018), 6,450 mg (11/14/2018)  for chemotherapy treatment.      HISTORY OF PRESENTING ILLNESS:  Matthew Yang 58 y.o.  male stage IV adenocarcinoma esophagus currently on FOLFOX chemotherapy is here for follow-up/review the rest of the PET scan.  Interim patient was seen in the symptom management clinic for right flank pain; also difficulty swallowing.  CT scan abdomen pelvis showed possible-2 mm stone at the UPJ junction.  Abdominal pain resolved.  With the difficulty swallowing patient-was supposed to follow-up with gastroenterology.  However the next day his symptoms spontaneously improved.  He is able to swallow better.  Currently no nausea no vomiting.  Mild tingling and numbness.    Review of Systems  Constitutional: Positive for malaise/fatigue. Negative for chills, diaphoresis and fever.  HENT: Negative for nosebleeds and sore throat.   Eyes: Negative for double vision.  Respiratory: Negative for cough, hemoptysis, sputum production, shortness of breath and wheezing.   Cardiovascular: Negative for chest pain,  palpitations, orthopnea and leg swelling.  Gastrointestinal: Negative for abdominal pain, blood in stool, constipation, diarrhea, heartburn,  melena and nausea.  Genitourinary: Negative for dysuria, frequency and urgency.  Musculoskeletal: Negative for back pain and joint pain.  Skin: Negative.  Negative for itching and rash.  Neurological: Positive for tingling. Negative for dizziness, focal weakness, weakness and headaches.  Endo/Heme/Allergies: Does not bruise/bleed easily.  Psychiatric/Behavioral: Negative for depression. The patient is not nervous/anxious and does not have insomnia.      MEDICAL HISTORY:  Past Medical History:  Diagnosis Date  . Diabetes mellitus without complication ([place])   . Esophagus cancer ([place])   . Hepatitis    HEPATITIS C  . Hypercholesteremia   . Hypertension   . Morbid obesity ([place])   . Positive hepatitis C antibody test   . Sleep apnea   . Tubular adenoma of colon    polyp    SURGICAL HISTORY: Past Surgical History:  Procedure Laterality Date  . CHOLECYSTECTOMY    . COLONOSCOPY    . COLONOSCOPY WITH PROPOFOL N/A 05/04/2016   Procedure: COLONOSCOPY WITH PROPOFOL;  Surgeon: Lollie Sails, MD;  Location: [facility] ENDOSCOPY;  Service: Endoscopy;  Laterality: N/A;  . ESOPHAGOGASTRODUODENOSCOPY (EGD) WITH PROPOFOL N/A 08/04/2018   Procedure: ESOPHAGOGASTRODUODENOSCOPY (EGD) WITH PROPOFOL;  Surgeon: Lollie Sails, MD;  Location: [facility] ENDOSCOPY;  Service: Endoscopy;  Laterality: N/A;  . PORTA CATH INSERTION N/A 08/17/2018   Procedure: PORTA CATH INSERTION;  Surgeon: Algernon Huxley, MD;  Location: [facility] LAB;  Service: Cardiovascular;  Laterality: N/A;    SOCIAL HISTORY: Social History   Socioeconomic History  . Marital status: Married    Spouse name: Not on file  . Number of children: Not on file  . Years of education: Not on file  . Highest education level: Not on file  Occupational History  . Not on file  Social Needs  . Financial resource strain: Not on file  . Food insecurity    Worry: Not on file    Inability: Not on file  . Transportation needs    Medical: Not on  file    Non-medical: Not on file  Tobacco Use  . Smoking status: Never Smoker  . Smokeless tobacco: Never Used  Substance and Sexual Activity  . Alcohol use: Not Currently  . Drug use: No  . Sexual activity: Not on file  Lifestyle  . Physical activity    Days per week: Not on file    Minutes per session: Not on file  . Stress: Not on file  Relationships  . Social Herbalist on phone: Not on file    Gets together: Not on file    Attends religious service: Not on file    Active member of club or organization: Not on file    Attends meetings of clubs or organizations: Not on file    Relationship status: Not on file  . Intimate partner violence    Fear of current or ex partner: Not on file    Emotionally abused: Not on file    Physically abused: Not on file    Forced sexual activity: Not on file  Other Topics Concern  . 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     FAMILY HISTORY: Family History  Family history unknown: Yes    ALLERGIES:  has No Known Allergies.  MEDICATIONS:  Current Outpatient Medications  Medication Sig Dispense Refill  . atorvastatin (LIPITOR) 20 MG tablet Take 20 mg by mouth daily.    Marland Kitchen lidocaine-prilocaine (EMLA) cream Apply 1 application topically as needed. 30 g 4  . lisinopril (PRINIVIL,ZESTRIL) 20 MG tablet Take 20 mg by mouth daily.    . Multiple Vitamin (MULTIVITAMIN WITH MINERALS) TABS tablet Take 1 tablet by mouth daily.    . Omega-3 Fatty Acids (FISH OIL) 1000 MG CAPS Take 1,000 mg by mouth daily.     . pantoprazole (PROTONIX) 40 MG tablet Take 40 mg by mouth daily.    . Turmeric 500 MG CAPS Take 500 mg by mouth daily.    . ondansetron (ZOFRAN) 8 MG tablet Take 1 tablet (8 mg total) by mouth every 8 (eight) hours as needed for nausea or vomiting. (Patient not taking: Reported on 11/28/2018) 20 tablet 4  . prochlorperazine (COMPAZINE) 10 MG tablet Take 1  tablet (10 mg total) by mouth every 6 (six) hours as needed for nausea or vomiting. (Patient not taking: Reported on 11/28/2018) 30 tablet 4   No current facility-administered medications for this visit.    Facility-Administered Medications Ordered in Other Visits  Medication Dose Route Frequency Provider Last Rate Last Dose  . dexamethasone (DECADRON) injection 10 mg  10 mg Intravenous Once Cammie Sickle, MD   10 mg at 11/28/18 0948  . fluorouracil (ADRUCIL) 6,450 mg in sodium chloride 0.9 % 121 mL chemo infusion  2,400 mg/m2 (Treatment Plan Recorded) Intravenous 1 day or 1 dose Charlaine Dalton R, MD      . leucovorin 1,000 mg in dextrose 5 % 250 mL infusion  373 mg/m2 (Treatment Plan Recorded) Intravenous Once Charlaine Dalton R, MD      . oxaliplatin (ELOXATIN) 160 mg in dextrose 5 % 500 mL chemo infusion  60 mg/m2 (Treatment Plan Recorded) Intravenous Once Cammie Sickle, MD          .  PHYSICAL EXAMINATION: ECOG PERFORMANCE STATUS: 1 - Symptomatic but completely ambulatory  Vitals:   11/28/18 0841  BP: 108/75  Pulse: 67  Resp: 20  Temp: (!) 97.5 F (36.4 C)   Filed Weights   11/28/18 0841  Weight: 297 lb (134.7 kg)    Physical Exam  Constitutional: He is oriented to person, place, and time and well-developed, well-nourished, and in no distress.  HENT:  Head: Normocephalic and atraumatic.  Mouth/Throat: Oropharynx is clear and moist. No oropharyngeal exudate.  Eyes: Pupils are equal, round, and reactive to light.  Neck: Normal range of motion. Neck supple.  Cardiovascular: Normal rate and regular rhythm.  Pulmonary/Chest: Effort normal and breath sounds normal. No respiratory distress. He has no wheezes.  Abdominal: Soft. Bowel sounds are normal. He exhibits no distension and no mass. There is no abdominal tenderness. There is no rebound and no guarding.  Musculoskeletal: Normal range of motion.        General: No tenderness or edema.  Neurological:  He is alert and oriented to person, place, and time.  Skin: Skin is warm.  Psychiatric: Affect normal.     LABORATORY DATA:  I have reviewed the data as listed Lab Results  Component Value Date   WBC 4.3 11/28/2018   HGB 12.7 (L) 11/28/2018   HCT 36.6 (L) 11/28/2018   MCV 89.9 11/28/2018   PLT 94 (L) 11/28/2018   Recent Labs    11/14/18 0828 11/20/18 0946 11/28/18 0810  NA 140 138 139  K 4.1 4.0 3.7  CL 108 105  109  CO2 _0 GLUCOSE 174* 158* 198*  BUN _1 CREATININE 0.84 0.84 0.83  CALCIUM 9.1 9.3 8.6*  GFRNONAA >60 >60 >60  GFRAA >60 >60 >60  PROT 6.9 8.1 6.5  ALBUMIN 3.9 4.4 3.5  AST 28 41 27  ALT 29 52* 29  ALKPHOS 125 104 106  BILITOT 1.0 1.9* 1.0    RADIOGRAPHIC STUDIES: I have personally reviewed the radiological images as listed and agreed with the findings in the report. Ct Chest W Contrast  Result Date: 11/20/2018 CLINICAL DATA:  58 year old male with right lower quadrant abdominal pain. History of esophageal cancer being treated with radiation and chemotherapy. EXAM: CT CHEST, ABDOMEN, AND PELVIS WITH CONTRAST TECHNIQUE: Multidetector CT imaging of the chest, abdomen and pelvis was performed following the standard protocol during bolus administration of intravenous contrast. CONTRAST:  15m OMNIPAQUE IOHEXOL 300 MG/ML  SOLN COMPARISON:  CT of the chest abdomen pelvis dated 08/09/2018 FINDINGS: CT CHEST FINDINGS Cardiovascular: There is no cardiomegaly or pericardial effusion. Multi vessel coronary vascular calcification and calcification of the mitral annulus. Mild atherosclerotic calcification of the thoracic aorta. The central pulmonary arteries appear unremarkable the degree of opacification. Right-sided Port-A-Cath with tip at the cavoatrial junction. Mediastinum/Nodes: There is no hilar or mediastinal adenopathy. There is a small hiatal hernia. Heterogeneous content in the distal esophagus, likely ingested matter. Evaluation of the esophagus and  gastroesophageal junction and stomach is limited in the absence of oral contrast. However, the previously seen masslike thickening of the gastroesophageal junction is not appreciated on today's scan. The thyroid gland is grossly unremarkable. No mediastinal fluid collection. Lungs/Pleura: The lungs are clear. There is no pleural effusion pneumothorax. The central airways are patent. Musculoskeletal: There is degenerative changes of the spine. No acute osseous pathology. No suspicious bone lesions. CT ABDOMEN PELVIS FINDINGS No intra-abdominal free air or free fluid. Hepatobiliary: Cirrhosis. No intrahepatic biliary ductal dilatation. Cholecystectomy. No retained calcified stone noted in the central CBD. Pancreas: Unremarkable. No pancreatic ductal dilatation or surrounding inflammatory changes. Spleen: Normal in size without focal abnormality. Adrenals/Urinary Tract: The adrenal glands are unremarkable. There is a 2 mm stone along the posterior wall of the urinary bladder adjacent to the right ureterovesical junction which may represent a recently passed right renal calculus versus a right UVJ stone. There is mild right hydronephrosis. There is a 2 mm nonobstructing left renal upper pole calculus. There is no hydronephrosis on the left. The urinary bladder is predominantly collapsed. Stomach/Bowel: Evaluation of the bowel is limited in the absence of oral contrast. Mild haziness of the fat surrounding the gastroesophageal junction. There is no bowel obstruction or active inflammation. The appendix is normal. Vascular/Lymphatic: The abdominal aorta and IVC are unremarkable. No portal venous gas. Mildly enlarged gastrohepatic space lymph node measures approximately 12 mm. There is slight haziness of the fat surrounding this lymph node. Overall significant interval improvement in the previously seen lymphadenopathy in the gastrohepatic space. No new adenopathy. Reproductive: The prostate and seminal vesicles are  grossly unremarkable. No pelvic mass. Other: None Musculoskeletal: Degenerative changes of the spine. No acute osseous pathology. No suspicious osseous lesions. IMPRESSION: 1. A 2 mm recently passed right renal calculus versus a right UVJ stone with mild right hydronephrosis. 2. A 2 mm nonobstructing left renal upper pole calculus. No hydronephrosis. 3. Significant interval improvement of the previously seen masslike thickening of the gastroesophageal junction and adenopathy seen in the gastrohepatic space on the prior CT. No evidence of metastatic  disease. 4. Cirrhosis. 5. No bowel obstruction or active inflammation. Normal appendix. 6. Aortic Atherosclerosis (ICD10-I70.0). Electronically Signed   By: Anner Crete M.D.   On: 11/20/2018 12:30   Ct Abdomen Pelvis W Contrast  Result Date: 11/20/2018 CLINICAL DATA:  58 year old male with right lower quadrant abdominal pain. History of esophageal cancer being treated with radiation and chemotherapy. EXAM: CT CHEST, ABDOMEN, AND PELVIS WITH CONTRAST TECHNIQUE: Multidetector CT imaging of the chest, abdomen and pelvis was performed following the standard protocol during bolus administration of intravenous contrast. CONTRAST:  152m OMNIPAQUE IOHEXOL 300 MG/ML  SOLN COMPARISON:  CT of the chest abdomen pelvis dated 08/09/2018 FINDINGS: CT CHEST FINDINGS Cardiovascular: There is no cardiomegaly or pericardial effusion. Multi vessel coronary vascular calcification and calcification of the mitral annulus. Mild atherosclerotic calcification of the thoracic aorta. The central pulmonary arteries appear unremarkable the degree of opacification. Right-sided Port-A-Cath with tip at the cavoatrial junction. Mediastinum/Nodes: There is no hilar or mediastinal adenopathy. There is a small hiatal hernia. Heterogeneous content in the distal esophagus, likely ingested matter. Evaluation of the esophagus and gastroesophageal junction and stomach is limited in the absence of oral  contrast. However, the previously seen masslike thickening of the gastroesophageal junction is not appreciated on today's scan. The thyroid gland is grossly unremarkable. No mediastinal fluid collection. Lungs/Pleura: The lungs are clear. There is no pleural effusion pneumothorax. The central airways are patent. Musculoskeletal: There is degenerative changes of the spine. No acute osseous pathology. No suspicious bone lesions. CT ABDOMEN PELVIS FINDINGS No intra-abdominal free air or free fluid. Hepatobiliary: Cirrhosis. No intrahepatic biliary ductal dilatation. Cholecystectomy. No retained calcified stone noted in the central CBD. Pancreas: Unremarkable. No pancreatic ductal dilatation or surrounding inflammatory changes. Spleen: Normal in size without focal abnormality. Adrenals/Urinary Tract: The adrenal glands are unremarkable. There is a 2 mm stone along the posterior wall of the urinary bladder adjacent to the right ureterovesical junction which may represent a recently passed right renal calculus versus a right UVJ stone. There is mild right hydronephrosis. There is a 2 mm nonobstructing left renal upper pole calculus. There is no hydronephrosis on the left. The urinary bladder is predominantly collapsed. Stomach/Bowel: Evaluation of the bowel is limited in the absence of oral contrast. Mild haziness of the fat surrounding the gastroesophageal junction. There is no bowel obstruction or active inflammation. The appendix is normal. Vascular/Lymphatic: The abdominal aorta and IVC are unremarkable. No portal venous gas. Mildly enlarged gastrohepatic space lymph node measures approximately 12 mm. There is slight haziness of the fat surrounding this lymph node. Overall significant interval improvement in the previously seen lymphadenopathy in the gastrohepatic space. No new adenopathy. Reproductive: The prostate and seminal vesicles are grossly unremarkable. No pelvic mass. Other: None Musculoskeletal:  Degenerative changes of the spine. No acute osseous pathology. No suspicious osseous lesions. IMPRESSION: 1. A 2 mm recently passed right renal calculus versus a right UVJ stone with mild right hydronephrosis. 2. A 2 mm nonobstructing left renal upper pole calculus. No hydronephrosis. 3. Significant interval improvement of the previously seen masslike thickening of the gastroesophageal junction and adenopathy seen in the gastrohepatic space on the prior CT. No evidence of metastatic disease. 4. Cirrhosis. 5. No bowel obstruction or active inflammation. Normal appendix. 6. Aortic Atherosclerosis (ICD10-I70.0). Electronically Signed   By: AAnner CreteM.D.   On: 11/20/2018 12:30   Nm Pet Image Restag (ps) Skull Base To Thigh  Result Date: 11/27/2018 CLINICAL DATA:  Subsequent treatment strategy for esophageal carcinoma. EXAM:  NUCLEAR MEDICINE PET SKULL BASE TO THIGH TECHNIQUE: 14.5 mCi F-18 FDG was injected intravenously. Full-ring PET imaging was performed from the skull base to thigh after the radiotracer. CT data was obtained and used for attenuation correction and anatomic localization. Fasting blood glucose: 100 mg/dl COMPARISON:  08/18/2018 FINDINGS: Mediastinal blood pool activity: SUV max 3.46. Liver activity: SUV max NA NECK: No hypermetabolic lymph nodes in the neck. Incidental CT findings: none CHEST: There has been considerable decrease in size and FDG uptake associated with distal esophageal mass. On the current exam this measures approximately 4 cm with SUV max of 5.04. On the previous exam this measured approximately 8.8 cm within SUV max of 29.5. No hypermetabolic supraclavicular, axillary, mediastinal, or hilar lymph nodes. No hypermetabolic pulmonary nodules. Incidental CT findings: Aortic atherosclerosis. Three vessel coronary artery atherosclerotic calcifications. ABDOMEN/PELVIS: The liver has a nodular contour suggestive of cirrhosis. No FDG avid liver lesions identified. Pancreas  negative. Spleen negative. Adrenal glands are unremarkable. Interval improvement in upper abdominal adenopathy. Index gastrohepatic ligament node measures 1 cm within SUV max of 3.1. This is compared with 2 cm previously within SUV max of 22. Porta hepatic lymph node measures 0.6 cm within SUV max of 3.7. Previously this measured 1.7 cm with SUV max of 36.6. Short axis portacaval node measures 1.1 cm within SUV max of 6.88. Previously 2 cm with SUV max of 41.05. Resolution of previous FDG avid aortocaval lymph nodes which had an SUV max of 14.68 previously. No FDG avid pelvic or inguinal lymph nodes identified. Incidental CT findings: Aortic atherosclerosis. Previous cholecystectomy. No ascites or focal fluid collections. SKELETON: No focal hypermetabolic activity to suggest skeletal metastasis. Incidental CT findings: None IMPRESSION: 1. Interval response to therapy. Considerable decrease in size and degree of FDG uptake associated with previous hypermetabolic distal esophageal mass extending into the stomach. 2. Decrease in size and degree of FDG uptake associated with previously noted FDG avid upper abdominal nodal metastases. 3. No new sites of disease. 4.  Aortic Atherosclerosis (ICD10-I70.0). 5. Morphologic features of the liver suggestive of early cirrhosis. Electronically Signed   By: Kerby Moors M.D.   On: 11/27/2018 16:58    ASSESSMENT & PLAN:   Malignant neoplasm of lower third of esophagus ([place]) #Adenocarcinoma [poorly differentiated/question signet ring morphology] lower third of esophagus/GE junction. Stage IV.  Currently on FOLFOX.  November 27, 2018-PET scan-significant partial response.   # proceed with cycle # 5 of FOLFOX today. Labs today reviewed;  acceptable for treatment today. Interested in xeloda-discussed my concerns with swallowing pills/the schedule/potential side effects of hand-foot syndrome diarrhea etc.  # Thrombocytopenia-platelets 94; grade 2 from oxaliplatin.   Asymptomatic; will decrease the dose of oxaliplatin.  # Difficulty swallowing/dysphagia-question foreign body; spontaneously passed. Discussed dietary referral. Declined.   # Right kidney stone-improved pain.  Discussed referral to urology; delcined.   # Early cirrhosis-no prior Bx. also likely contributed to his low platelets/along with oxaliplatin.  # DISPOSITION: # Treatment today;pump off in 2 days.  # follow up in 2 weeks- MD; labs- cbc/cmp- FOLFOX;PET scan prior- pump of in 2 days. - Dr.B  # I reviewed the blood work- with the patient in detail; also reviewed the imaging independently [as summarized above]; and with the patient in detail.  He was given a copy of the report.     All questions were answered. The patient knows to call the clinic with any problems, questions or concerns.    Cammie Sickle, MD 11/28/2018 9:51 AM

## 2018-11-28 NOTE — Assessment & Plan Note (Addendum)
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  Currently on FOLFOX.  November 27, 2018-PET scan-significant partial response.   # proceed with cycle # 5 of FOLFOX today. Labs today reviewed;  acceptable for treatment today. Interested in xeloda-discussed my concerns with swallowing pills/the schedule/potential side effects of hand-foot syndrome diarrhea etc.  # Thrombocytopenia-platelets 94; grade 2 from oxaliplatin.  Asymptomatic; will decrease the dose of oxaliplatin.  # Difficulty swallowing/dysphagia-question foreign body; spontaneously passed. Discussed dietary referral. Declined.   # Right kidney stone-improved pain.  Discussed referral to urology; delcined.   # Early cirrhosis-no prior Bx. also likely contributed to his low platelets/along with oxaliplatin.  # DISPOSITION: # Treatment today;pump off in 2 days.  # follow up in 2 weeks- MD; labs- cbc/cmp- FOLFOX;PET scan prior- pump of in 2 days. - Dr.B  # I reviewed the blood work- with the patient in detail; also reviewed the imaging independently [as summarized above]; and with the patient in detail.  He was given a copy of the report.

## 2018-11-30 ENCOUNTER — Other Ambulatory Visit: Payer: Self-pay

## 2018-11-30 ENCOUNTER — Inpatient Hospital Stay: Payer: No Typology Code available for payment source

## 2018-11-30 VITALS — BP 104/64 | HR 72 | Temp 96.2°F | Resp 20

## 2018-11-30 DIAGNOSIS — C155 Malignant neoplasm of lower third of esophagus: Secondary | ICD-10-CM

## 2018-11-30 DIAGNOSIS — Z5111 Encounter for antineoplastic chemotherapy: Secondary | ICD-10-CM | POA: Diagnosis not present

## 2018-11-30 DIAGNOSIS — Z7189 Other specified counseling: Secondary | ICD-10-CM

## 2018-11-30 MED ORDER — HEPARIN SOD (PORK) LOCK FLUSH 100 UNIT/ML IV SOLN
500.0000 [IU] | Freq: Once | INTRAVENOUS | Status: AC | PRN
  Administered 2018-11-30: 13:00:00 500 [IU]
  Filled 2018-11-30: qty 5

## 2018-11-30 MED ORDER — SODIUM CHLORIDE 0.9% FLUSH
10.0000 mL | INTRAVENOUS | Status: DC | PRN
  Administered 2018-11-30: 10 mL
  Filled 2018-11-30: qty 10

## 2018-11-30 NOTE — Progress Notes (Signed)
Nutrition Follow-up:  Patient with metastatic GE junction adenocarcinoma.  Patient has completed radiation.  Receiving folfox chemotherapy. Noted results from PET scan.  Spoke with patient via phone today.  Patient reports that he had an episode of food blockage where he could not eat or drink for about 72 hours and also passed kidney stone.  Notes reviewed from [facility].  Blockage ended up resolving on its own and did not see GI.  Patient reports that since that episode has been eating better and staying hydrated.  Reports that he is eating most foods and trying to include good sources of protein.  Denies any other nutrition impact symptoms.    Medications: reviewed  Labs: glucose 198 (9/22)  Anthropometrics:   Weight 297 lb on 9/22 decreased from 305 lb  3% weight loss in 1 month, not significant   NUTRITION DIAGNOSIS: Inadequate oral intake stable   INTERVENTION:  Encouraged patient to chew foods well, add liquid, gravy to make things easier to swallow and less chance of blockage.  Encouraged small frequent meals. Patient has contact information    MONITORING, EVALUATION, GOAL: Patient will consume adequate calories and protein to maintain lean muscle mass   NEXT VISIT: phone Thursday, October 8th  Matthew Yang, [place], [place] Registered Dietitian [phone] (pager)

## 2018-12-11 ENCOUNTER — Encounter: Payer: Self-pay | Admitting: Internal Medicine

## 2018-12-11 ENCOUNTER — Other Ambulatory Visit: Payer: Self-pay

## 2018-12-12 ENCOUNTER — Inpatient Hospital Stay: Payer: No Typology Code available for payment source | Attending: Internal Medicine

## 2018-12-12 ENCOUNTER — Encounter: Payer: Self-pay | Admitting: Internal Medicine

## 2018-12-12 ENCOUNTER — Inpatient Hospital Stay: Payer: No Typology Code available for payment source

## 2018-12-12 ENCOUNTER — Other Ambulatory Visit: Payer: Self-pay

## 2018-12-12 ENCOUNTER — Inpatient Hospital Stay (HOSPITAL_BASED_OUTPATIENT_CLINIC_OR_DEPARTMENT_OTHER): Payer: No Typology Code available for payment source | Admitting: Internal Medicine

## 2018-12-12 DIAGNOSIS — B1920 Unspecified viral hepatitis C without hepatic coma: Secondary | ICD-10-CM | POA: Insufficient documentation

## 2018-12-12 DIAGNOSIS — R5381 Other malaise: Secondary | ICD-10-CM | POA: Insufficient documentation

## 2018-12-12 DIAGNOSIS — E7800 Pure hypercholesterolemia, unspecified: Secondary | ICD-10-CM | POA: Insufficient documentation

## 2018-12-12 DIAGNOSIS — C155 Malignant neoplasm of lower third of esophagus: Secondary | ICD-10-CM

## 2018-12-12 DIAGNOSIS — Z5111 Encounter for antineoplastic chemotherapy: Secondary | ICD-10-CM | POA: Diagnosis not present

## 2018-12-12 DIAGNOSIS — I10 Essential (primary) hypertension: Secondary | ICD-10-CM | POA: Diagnosis not present

## 2018-12-12 DIAGNOSIS — E119 Type 2 diabetes mellitus without complications: Secondary | ICD-10-CM | POA: Insufficient documentation

## 2018-12-12 DIAGNOSIS — D696 Thrombocytopenia, unspecified: Secondary | ICD-10-CM | POA: Diagnosis not present

## 2018-12-12 DIAGNOSIS — R5383 Other fatigue: Secondary | ICD-10-CM | POA: Diagnosis not present

## 2018-12-12 DIAGNOSIS — K7460 Unspecified cirrhosis of liver: Secondary | ICD-10-CM | POA: Insufficient documentation

## 2018-12-12 DIAGNOSIS — G4730 Sleep apnea, unspecified: Secondary | ICD-10-CM | POA: Insufficient documentation

## 2018-12-12 DIAGNOSIS — Z79899 Other long term (current) drug therapy: Secondary | ICD-10-CM | POA: Insufficient documentation

## 2018-12-12 DIAGNOSIS — Z87442 Personal history of urinary calculi: Secondary | ICD-10-CM | POA: Diagnosis not present

## 2018-12-12 DIAGNOSIS — Z95828 Presence of other vascular implants and grafts: Secondary | ICD-10-CM

## 2018-12-12 DIAGNOSIS — C772 Secondary and unspecified malignant neoplasm of intra-abdominal lymph nodes: Secondary | ICD-10-CM | POA: Insufficient documentation

## 2018-12-12 DIAGNOSIS — Z7189 Other specified counseling: Secondary | ICD-10-CM

## 2018-12-12 LAB — COMPREHENSIVE METABOLIC PANEL
ALT: 27 U/L (ref 0–44)
AST: 30 U/L (ref 15–41)
Albumin: 3.6 g/dL (ref 3.5–5.0)
Alkaline Phosphatase: 113 U/L (ref 38–126)
Anion gap: 6 (ref 5–15)
BUN: 16 mg/dL (ref 6–20)
CO2: 24 mmol/L (ref 22–32)
Calcium: 8.7 mg/dL — ABNORMAL LOW (ref 8.9–10.3)
Chloride: 108 mmol/L (ref 98–111)
Creatinine, Ser: 0.75 mg/dL (ref 0.61–1.24)
GFR calc Af Amer: >60 mL/min (ref >60)
GFR calc non Af Amer: >60 mL/min (ref >60)
Glucose, Bld: 185 mg/dL — ABNORMAL HIGH (ref 70–99)
Potassium: 3.8 mmol/L (ref 3.5–5.1)
Sodium: 138 mmol/L (ref 135–145)
Total Bilirubin: 1.2 mg/dL (ref 0.3–1.2)
Total Protein: 6.7 g/dL (ref 6.5–8.1)

## 2018-12-12 LAB — CBC WITH DIFFERENTIAL/PLATELET
Abs Immature Granulocytes: 0.02 10*3/uL (ref 0.00–0.07)
Basophils Absolute: 0 10*3/uL (ref 0.0–0.1)
Basophils Relative: 1 %
Eosinophils Absolute: 0.4 10*3/uL (ref 0.0–0.5)
Eosinophils Relative: 8 %
HCT: 36.4 % — ABNORMAL LOW (ref 39.0–52.0)
Hemoglobin: 12.5 g/dL — ABNORMAL LOW (ref 13.0–17.0)
Immature Granulocytes: 1 %
Lymphocytes Relative: 19 %
Lymphs Abs: 0.8 10*3/uL (ref 0.7–4.0)
MCH: 31.4 pg (ref 26.0–34.0)
MCHC: 34.3 g/dL (ref 30.0–36.0)
MCV: 91.5 fL (ref 80.0–100.0)
Monocytes Absolute: 1 10*3/uL (ref 0.1–1.0)
Monocytes Relative: 23 %
Neutro Abs: 2.2 10*3/uL (ref 1.7–7.7)
Neutrophils Relative %: 48 %
Platelets: 94 10*3/uL — ABNORMAL LOW (ref 150–400)
RBC: 3.98 MIL/uL — ABNORMAL LOW (ref 4.22–5.81)
RDW: 14.7 % (ref 11.5–15.5)
WBC: 4.4 10*3/uL (ref 4.0–10.5)
nRBC: 0 % (ref 0.0–0.2)

## 2018-12-12 MED ORDER — SODIUM CHLORIDE 0.9% FLUSH
10.0000 mL | Freq: Once | INTRAVENOUS | Status: AC
  Administered 2018-12-12: 08:00:00 10 mL via INTRAVENOUS
  Filled 2018-12-12: qty 10

## 2018-12-12 MED ORDER — DEXAMETHASONE SODIUM PHOSPHATE 10 MG/ML IJ SOLN
10.0000 mg | Freq: Once | INTRAMUSCULAR | Status: AC
  Administered 2018-12-12: 10:00:00 10 mg via INTRAVENOUS
  Filled 2018-12-12: qty 1

## 2018-12-12 MED ORDER — DEXTROSE 5 % IV SOLN
Freq: Once | INTRAVENOUS | Status: AC
  Administered 2018-12-12: 10:00:00 via INTRAVENOUS
  Filled 2018-12-12: qty 250

## 2018-12-12 MED ORDER — OXALIPLATIN CHEMO INJECTION 100 MG/20ML
60.0000 mg/m2 | Freq: Once | INTRAVENOUS | Status: AC
  Administered 2018-12-12: 160 mg via INTRAVENOUS
  Filled 2018-12-12: qty 32

## 2018-12-12 MED ORDER — PALONOSETRON HCL INJECTION 0.25 MG/5ML
0.2500 mg | Freq: Once | INTRAVENOUS | Status: AC
  Administered 2018-12-12: 10:00:00 0.25 mg via INTRAVENOUS
  Filled 2018-12-12: qty 5

## 2018-12-12 MED ORDER — SODIUM CHLORIDE 0.9 % IV SOLN
2400.0000 mg/m2 | INTRAVENOUS | Status: DC
  Administered 2018-12-12: 13:00:00 6450 mg via INTRAVENOUS
  Filled 2018-12-12: qty 100

## 2018-12-12 MED ORDER — LEUCOVORIN CALCIUM INJECTION 100 MG: 20.0000 mg/m2 | Freq: Once | INTRAMUSCULAR | Status: DC

## 2018-12-12 MED ORDER — LIDOCAINE-PRILOCAINE 2.5-2.5 % EX CREA: 1.0000 "application " | TOPICAL_CREAM | CUTANEOUS | 4 refills | Status: DC | PRN

## 2018-12-12 MED ORDER — LEUCOVORIN CALCIUM INJECTION 350 MG
373.0000 mg/m2 | Freq: Once | INTRAVENOUS | Status: AC
  Administered 2018-12-12: 11:00:00 1000 mg via INTRAVENOUS
  Filled 2018-12-12: qty 50

## 2018-12-12 NOTE — Assessment & Plan Note (Addendum)
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  Currently on FOLFOX.  November 27, 2018-PET scan-significant partial response.   # proceed with cycle # 7 of FOLFOX today. Labs today reviewed;  acceptable for treatment today.   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  # Difficulty swallowing/dysphagia-improved.  # Right kidney stone-improved.   # DISPOSITION: # Treatment today; pump off in 2 days.  # follow up in 2 weeks- MD; labs- cbc/cmp- FOLFOX; pump of in 2 days. - Dr.B

## 2018-12-12 NOTE — Progress Notes (Signed)
[name] NOTE  Patient Care Team: Kirk Ruths, MD as PCP - General (Internal Medicine) Lucky Cowboy, Erskine Squibb, MD as Consulting Physician (Vascular Surgery) Clent Jacks, RN as Registered Nurse  CHIEF COMPLAINTS/PURPOSE OF CONSULTATION: Gastroesophageal cancer  #  Oncology History Overview Note  # June 2020-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  # June rd week- 5FU pump every 2 days; +RT [until aug 4th]  # 10/16/2018-FOLFOX #1; 9/21-PET- PR. 11/28/2018- cycle #6-decreased the ox dose to 60 mg/2 [sec to platelets- 94].   # DM- diet controlled; Hepatitis C; September 2020-right kidney stone/spontaneous; foreign body impaction/spontaneous  # NGS/omniseq- PDL-1 CPS 20%;EGFR-copy number gain; NEG- targets/ Her-2 neu.   DIAGNOSIS: Adenocarcinoma esophagus/GE junction  STAGE: IV     ;GOALS: pallaitive  CURRENT/MOST RECENT THERAPY ; FOLFOX every 2 weeks      Malignant neoplasm of lower third of esophagus ([facility])  08/30/2018 -  Chemotherapy   The patient had palonosetron (ALOXI) injection 0.25 mg, 0.25 mg, Intravenous,  Once, 4 of 10 cycles Administration: 0.25 mg (10/30/2018), 0.25 mg (10/16/2018), 0.25 mg (11/28/2018), 0.25 mg (11/14/2018) leucovorin 1,072 mg in dextrose 5 % 250 mL infusion, 400 mg/m2 = 1,072 mg, Intravenous,  Once, 6 of 12 cycles Administration: 1,000 mg (10/30/2018), 1,000 mg (10/16/2018), 1,000 mg (11/28/2018) oxaliplatin  (ELOXATIN) 230 mg in dextrose 5 % 500 mL chemo infusion, 85 mg/m2 = 230 mg, Intravenous,  Once, 4 of 10 cycles Dose modification: 60 mg/m2 (original dose 85 mg/m2, Cycle 7, Reason: Provider Judgment) Administration: 230 mg (10/30/2018), 230 mg (10/16/2018), 160 mg (11/28/2018), 230 mg (11/14/2018) leucovorin injection 54 mg, 20 mg/m2 = 54 mg (100 % of original dose 20 mg/m2), Intravenous,  Once, 6 of 12 cycles Dose modification: 20 mg/m2 (original dose 20 mg/m2, Cycle 2, Reason: Other (see comments), Comment: iv push), 20 mg/m2 (original dose 20 mg/m2, Cycle 3, Reason: Other (see comments), Comment: ivp) Administration: 54 mg (09/13/2018), 54 mg (10/02/2018) fluorouracil (ADRUCIL) 6,450 mg in sodium chloride 0.9 % 121 mL chemo infusion, 2,400 mg/m2 = 6,450 mg, Intravenous, 1 Day/Dose, 7 of 13 cycles Administration: 6,450 mg (08/30/2018), 6,450 mg (09/13/2018), 6,450 mg (10/30/2018), 6,450 mg (10/02/2018), 6,450 mg (10/16/2018), 6,450 mg (11/28/2018), 6,450 mg (11/14/2018)  for chemotherapy treatment.      HISTORY OF PRESENTING ILLNESS:  Matthew Yang 58 y.o.  male stage IV adenocarcinoma esophagus currently on FOLFOX chemotherapy is here   Patient denies any worsening difficulty swallowing.  Denies any worsening abdominal pain [recent kidney stone].  Back to his work.  Currently no nausea no vomiting.  Mild tingling and numbness.  Review of Systems  Constitutional: Positive for malaise/fatigue. Negative for chills, diaphoresis and fever.  HENT: Negative for nosebleeds and sore throat.   Eyes: Negative for double vision.  Respiratory: Negative for cough, hemoptysis, sputum production, shortness of breath and wheezing.   Cardiovascular: Negative for chest pain, palpitations, orthopnea and leg swelling.  Gastrointestinal: Negative for abdominal pain, blood in stool, constipation, diarrhea, heartburn, melena and nausea.  Genitourinary: Negative for dysuria, frequency and urgency.  Musculoskeletal: Negative for  back pain and joint pain.  Skin: Negative.  Negative for itching and rash.  Neurological: Positive for tingling. Negative for dizziness, focal weakness, weakness and headaches.  Endo/Heme/Allergies: Does not bruise/bleed easily.  Psychiatric/Behavioral: Negative for depression. The patient is not nervous/anxious and does not have insomnia.      MEDICAL HISTORY:  Past Medical History:  Diagnosis Date  . Diabetes mellitus without complication ([place])   . Esophagus cancer ([place])   . Hepatitis    HEPATITIS C  . Hypercholesteremia   . Hypertension   . Morbid obesity ([place])   . Positive hepatitis C antibody test   . Sleep apnea   . Tubular adenoma of colon    polyp    SURGICAL HISTORY: Past Surgical History:  Procedure Laterality Date  . CHOLECYSTECTOMY    . COLONOSCOPY    . COLONOSCOPY WITH PROPOFOL N/A 05/04/2016   Procedure: COLONOSCOPY WITH PROPOFOL;  Surgeon: Lollie Sails, MD;  Location: [facility] ENDOSCOPY;  Service: Endoscopy;  Laterality: N/A;  . ESOPHAGOGASTRODUODENOSCOPY (EGD) WITH PROPOFOL N/A 08/04/2018   Procedure: ESOPHAGOGASTRODUODENOSCOPY (EGD) WITH PROPOFOL;  Surgeon: Lollie Sails, MD;  Location: [facility] ENDOSCOPY;  Service: Endoscopy;  Laterality: N/A;  . PORTA CATH INSERTION N/A 08/17/2018   Procedure: PORTA CATH INSERTION;  Surgeon: Algernon Huxley, MD;  Location: [place] CV LAB;  Service: Cardiovascular;  Laterality: N/A;    SOCIAL HISTORY: Social History   Socioeconomic History  . Marital status: Married    Spouse name: Not on file  . Number of children: Not on file  . Years of education: Not on file  . Highest education level: Not on file  Occupational History  . Not on file  Social Needs  . Financial resource strain: Not on file  . Food insecurity    Worry: Not on file    Inability: Not on file  . Transportation needs    Medical: Not on file    Non-medical: Not on file  Tobacco Use  . Smoking status: Never Smoker  . Smokeless tobacco: Never  Used  Substance and Sexual Activity  . Alcohol use: Not Currently  . Drug use: No  . Sexual activity: Not on file  Lifestyle  . Physical activity    Days per week: Not on file    Minutes per session: Not on file  . Stress: Not on file  Relationships  . Social Herbalist on phone: Not on file    Gets together: Not on file    Attends religious service: Not on file    Active member of club or organization: Not on file    Attends meetings of clubs or organizations: Not on file    Relationship status: Not on file  . Intimate partner violence    Fear of current or ex partner: Not on file    Emotionally abused: Not on file    Physically abused: Not on file    Forced sexual activity: Not on file  Other Topics Concern  . 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     FAMILY HISTORY: Family History  Family history unknown: Yes    ALLERGIES:  has No Known Allergies.  MEDICATIONS:  Current Outpatient Medications  Medication Sig Dispense Refill  . atorvastatin (LIPITOR) 20 MG tablet Take 20 mg by mouth daily.    Marland Kitchen lidocaine-prilocaine (EMLA) cream Apply 1 application topically  as needed. 30 g 4  . lisinopril (PRINIVIL,ZESTRIL) 20 MG tablet Take 20 mg by mouth daily.    . Multiple Vitamin (MULTIVITAMIN WITH MINERALS) TABS tablet Take 1 tablet by mouth daily.    . Omega-3 Fatty Acids (FISH OIL) 1000 MG CAPS Take 1,000 mg by mouth daily.     . pantoprazole (PROTONIX) 40 MG tablet Take 40 mg by mouth daily.    . Turmeric 500 MG CAPS Take 500 mg by mouth daily.    . ondansetron (ZOFRAN) 8 MG tablet Take 1 tablet (8 mg total) by mouth every 8 (eight) hours as needed for nausea or vomiting. (Patient not taking: Reported on 11/28/2018) 20 tablet 4  . prochlorperazine (COMPAZINE) 10 MG tablet Take 1 tablet (10 mg total) by mouth every 6 (six) hours as needed for nausea or vomiting. (Patient not taking: Reported  on 11/28/2018) 30 tablet 4   No current facility-administered medications for this visit.       Marland Kitchen  PHYSICAL EXAMINATION: ECOG PERFORMANCE STATUS: 1 - Symptomatic but completely ambulatory  Vitals:   12/12/18 0829  BP: 104/71  Pulse: 72  Resp: 18  Temp: (!) 96.5 F (35.8 C)  SpO2: 99%   Filed Weights   12/12/18 0829  Weight: 299 lb 6.4 oz (135.8 kg)    Physical Exam  Constitutional: He is oriented to person, place, and time and well-developed, well-nourished, and in no distress.  HENT:  Head: Normocephalic and atraumatic.  Mouth/Throat: Oropharynx is clear and moist. No oropharyngeal exudate.  Eyes: Pupils are equal, round, and reactive to light.  Neck: Normal range of motion. Neck supple.  Cardiovascular: Normal rate and regular rhythm.  Pulmonary/Chest: Effort normal and breath sounds normal. No respiratory distress. He has no wheezes.  Abdominal: Soft. Bowel sounds are normal. He exhibits no distension and no mass. There is no abdominal tenderness. There is no rebound and no guarding.  Musculoskeletal: Normal range of motion.        General: No tenderness or edema.  Neurological: He is alert and oriented to person, place, and time.  Skin: Skin is warm.  Psychiatric: Affect normal.     LABORATORY DATA:  I have reviewed the data as listed Lab Results  Component Value Date   WBC 4.4 12/12/2018   HGB 12.5 (L) 12/12/2018   HCT 36.4 (L) 12/12/2018   MCV 91.5 12/12/2018   PLT 94 (L) 12/12/2018   Recent Labs    11/20/18 0946 11/28/18 0810 12/12/18 0820  NA 138 139 138  K 4.0 3.7 3.8  CL 105 109 108  CO2 _0 GLUCOSE 158* 198* 185*  BUN _1 CREATININE 0.84 0.83 0.75  CALCIUM 9.3 8.6* 8.7*  GFRNONAA >60 >60 >60  GFRAA >60 >60 >60  PROT 8.1 6.5 6.7  ALBUMIN 4.4 3.5 3.6  AST 41 27 30  ALT 52* 29 27  ALKPHOS 104 106 113  BILITOT 1.9* 1.0 1.2    RADIOGRAPHIC STUDIES: I have personally reviewed the radiological images as listed and agreed with  the findings in the report. Ct Chest W Contrast  Result Date: 11/20/2018 CLINICAL DATA:  58 year old male with right lower quadrant abdominal pain. History of esophageal cancer being treated with radiation and chemotherapy. EXAM: CT CHEST, ABDOMEN, AND PELVIS WITH CONTRAST TECHNIQUE: Multidetector CT imaging of the chest, abdomen and pelvis was performed following the standard protocol during bolus administration of intravenous contrast. CONTRAST:  159m OMNIPAQUE IOHEXOL 300 MG/ML  SOLN COMPARISON:  CT of the chest abdomen pelvis dated 08/09/2018 FINDINGS: CT CHEST FINDINGS Cardiovascular: There is no cardiomegaly or pericardial effusion. Multi vessel coronary vascular calcification and calcification of the mitral annulus. Mild atherosclerotic calcification of the thoracic aorta. The central pulmonary arteries appear unremarkable the degree of opacification. Right-sided Port-A-Cath with tip at the cavoatrial junction. Mediastinum/Nodes: There is no hilar or mediastinal adenopathy. There is a small hiatal hernia. Heterogeneous content in the distal esophagus, likely ingested matter. Evaluation of the esophagus and gastroesophageal junction and stomach is limited in the absence of oral contrast. However, the previously seen masslike thickening of the gastroesophageal junction is not appreciated on today's scan. The thyroid gland is grossly unremarkable. No mediastinal fluid collection. Lungs/Pleura: The lungs are clear. There is no pleural effusion pneumothorax. The central airways are patent. Musculoskeletal: There is degenerative changes of the spine. No acute osseous pathology. No suspicious bone lesions. CT ABDOMEN PELVIS FINDINGS No intra-abdominal free air or free fluid. Hepatobiliary: Cirrhosis. No intrahepatic biliary ductal dilatation. Cholecystectomy. No retained calcified stone noted in the central CBD. Pancreas: Unremarkable. No pancreatic ductal dilatation or surrounding inflammatory changes. Spleen:  Normal in size without focal abnormality. Adrenals/Urinary Tract: The adrenal glands are unremarkable. There is a 2 mm stone along the posterior wall of the urinary bladder adjacent to the right ureterovesical junction which may represent a recently passed right renal calculus versus a right UVJ stone. There is mild right hydronephrosis. There is a 2 mm nonobstructing left renal upper pole calculus. There is no hydronephrosis on the left. The urinary bladder is predominantly collapsed. Stomach/Bowel: Evaluation of the bowel is limited in the absence of oral contrast. Mild haziness of the fat surrounding the gastroesophageal junction. There is no bowel obstruction or active inflammation. The appendix is normal. Vascular/Lymphatic: The abdominal aorta and IVC are unremarkable. No portal venous gas. Mildly enlarged gastrohepatic space lymph node measures approximately 12 mm. There is slight haziness of the fat surrounding this lymph node. Overall significant interval improvement in the previously seen lymphadenopathy in the gastrohepatic space. No new adenopathy. Reproductive: The prostate and seminal vesicles are grossly unremarkable. No pelvic mass. Other: None Musculoskeletal: Degenerative changes of the spine. No acute osseous pathology. No suspicious osseous lesions. IMPRESSION: 1. A 2 mm recently passed right renal calculus versus a right UVJ stone with mild right hydronephrosis. 2. A 2 mm nonobstructing left renal upper pole calculus. No hydronephrosis. 3. Significant interval improvement of the previously seen masslike thickening of the gastroesophageal junction and adenopathy seen in the gastrohepatic space on the prior CT. No evidence of metastatic disease. 4. Cirrhosis. 5. No bowel obstruction or active inflammation. Normal appendix. 6. Aortic Atherosclerosis (ICD10-I70.0). Electronically Signed   By: Anner Crete M.D.   On: 11/20/2018 12:30   Ct Abdomen Pelvis W Contrast  Result Date:  11/20/2018 CLINICAL DATA:  58 year old male with right lower quadrant abdominal pain. History of esophageal cancer being treated with radiation and chemotherapy. EXAM: CT CHEST, ABDOMEN, AND PELVIS WITH CONTRAST TECHNIQUE: Multidetector CT imaging of the chest, abdomen and pelvis was performed following the standard protocol during bolus administration of intravenous contrast. CONTRAST:  126m OMNIPAQUE IOHEXOL 300 MG/ML  SOLN COMPARISON:  CT of the chest abdomen pelvis dated 08/09/2018 FINDINGS: CT CHEST FINDINGS Cardiovascular: There is no cardiomegaly or pericardial effusion. Multi vessel coronary vascular calcification and calcification of the mitral annulus. Mild atherosclerotic calcification of the thoracic aorta. The central pulmonary arteries appear unremarkable the degree of opacification. Right-sided Port-A-Cath with tip at the cavoatrial  junction. Mediastinum/Nodes: There is no hilar or mediastinal adenopathy. There is a small hiatal hernia. Heterogeneous content in the distal esophagus, likely ingested matter. Evaluation of the esophagus and gastroesophageal junction and stomach is limited in the absence of oral contrast. However, the previously seen masslike thickening of the gastroesophageal junction is not appreciated on today's scan. The thyroid gland is grossly unremarkable. No mediastinal fluid collection. Lungs/Pleura: The lungs are clear. There is no pleural effusion pneumothorax. The central airways are patent. Musculoskeletal: There is degenerative changes of the spine. No acute osseous pathology. No suspicious bone lesions. CT ABDOMEN PELVIS FINDINGS No intra-abdominal free air or free fluid. Hepatobiliary: Cirrhosis. No intrahepatic biliary ductal dilatation. Cholecystectomy. No retained calcified stone noted in the central CBD. Pancreas: Unremarkable. No pancreatic ductal dilatation or surrounding inflammatory changes. Spleen: Normal in size without focal abnormality. Adrenals/Urinary Tract:  The adrenal glands are unremarkable. There is a 2 mm stone along the posterior wall of the urinary bladder adjacent to the right ureterovesical junction which may represent a recently passed right renal calculus versus a right UVJ stone. There is mild right hydronephrosis. There is a 2 mm nonobstructing left renal upper pole calculus. There is no hydronephrosis on the left. The urinary bladder is predominantly collapsed. Stomach/Bowel: Evaluation of the bowel is limited in the absence of oral contrast. Mild haziness of the fat surrounding the gastroesophageal junction. There is no bowel obstruction or active inflammation. The appendix is normal. Vascular/Lymphatic: The abdominal aorta and IVC are unremarkable. No portal venous gas. Mildly enlarged gastrohepatic space lymph node measures approximately 12 mm. There is slight haziness of the fat surrounding this lymph node. Overall significant interval improvement in the previously seen lymphadenopathy in the gastrohepatic space. No new adenopathy. Reproductive: The prostate and seminal vesicles are grossly unremarkable. No pelvic mass. Other: None Musculoskeletal: Degenerative changes of the spine. No acute osseous pathology. No suspicious osseous lesions. IMPRESSION: 1. A 2 mm recently passed right renal calculus versus a right UVJ stone with mild right hydronephrosis. 2. A 2 mm nonobstructing left renal upper pole calculus. No hydronephrosis. 3. Significant interval improvement of the previously seen masslike thickening of the gastroesophageal junction and adenopathy seen in the gastrohepatic space on the prior CT. No evidence of metastatic disease. 4. Cirrhosis. 5. No bowel obstruction or active inflammation. Normal appendix. 6. Aortic Atherosclerosis (ICD10-I70.0). Electronically Signed   By: Anner Crete M.D.   On: 11/20/2018 12:30   Nm Pet Image Restag (ps) Skull Base To Thigh  Result Date: 11/27/2018 CLINICAL DATA:  Subsequent treatment strategy for  esophageal carcinoma. EXAM: NUCLEAR MEDICINE PET SKULL BASE TO THIGH TECHNIQUE: 14.5 mCi F-18 FDG was injected intravenously. Full-ring PET imaging was performed from the skull base to thigh after the radiotracer. CT data was obtained and used for attenuation correction and anatomic localization. Fasting blood glucose: 100 mg/dl COMPARISON:  08/18/2018 FINDINGS: Mediastinal blood pool activity: SUV max 3.46. Liver activity: SUV max NA NECK: No hypermetabolic lymph nodes in the neck. Incidental CT findings: none CHEST: There has been considerable decrease in size and FDG uptake associated with distal esophageal mass. On the current exam this measures approximately 4 cm with SUV max of 5.04. On the previous exam this measured approximately 8.8 cm within SUV max of 29.5. No hypermetabolic supraclavicular, axillary, mediastinal, or hilar lymph nodes. No hypermetabolic pulmonary nodules. Incidental CT findings: Aortic atherosclerosis. Three vessel coronary artery atherosclerotic calcifications. ABDOMEN/PELVIS: The liver has a nodular contour suggestive of cirrhosis. No FDG avid liver lesions identified.  Pancreas negative. Spleen negative. Adrenal glands are unremarkable. Interval improvement in upper abdominal adenopathy. Index gastrohepatic ligament node measures 1 cm within SUV max of 3.1. This is compared with 2 cm previously within SUV max of 22. Porta hepatic lymph node measures 0.6 cm within SUV max of 3.7. Previously this measured 1.7 cm with SUV max of 36.6. Short axis portacaval node measures 1.1 cm within SUV max of 6.88. Previously 2 cm with SUV max of 41.05. Resolution of previous FDG avid aortocaval lymph nodes which had an SUV max of 14.68 previously. No FDG avid pelvic or inguinal lymph nodes identified. Incidental CT findings: Aortic atherosclerosis. Previous cholecystectomy. No ascites or focal fluid collections. SKELETON: No focal hypermetabolic activity to suggest skeletal metastasis. Incidental CT  findings: None IMPRESSION: 1. Interval response to therapy. Considerable decrease in size and degree of FDG uptake associated with previous hypermetabolic distal esophageal mass extending into the stomach. 2. Decrease in size and degree of FDG uptake associated with previously noted FDG avid upper abdominal nodal metastases. 3. No new sites of disease. 4.  Aortic Atherosclerosis (ICD10-I70.0). 5. Morphologic features of the liver suggestive of early cirrhosis. Electronically Signed   By: Kerby Moors M.D.   On: 11/27/2018 16:58    ASSESSMENT & PLAN:   Malignant neoplasm of lower third of esophagus ([place]) #Adenocarcinoma [poorly differentiated/question signet ring morphology] lower third of esophagus/GE junction. Stage IV.  Currently on FOLFOX.  November 27, 2018-PET scan-significant partial response.   # proceed with cycle # 7 of FOLFOX today. Labs today reviewed;  acceptable for treatment today.   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  # Difficulty swallowing/dysphagia-improved.  # Right kidney stone-improved.   # DISPOSITION: # Treatment today; pump off in 2 days.  # follow up in 2 weeks- MD; labs- cbc/cmp- FOLFOX; pump of in 2 days. - Dr.B   All questions were answered. The patient knows to call the clinic with any problems, questions or concerns.    Cammie Sickle, MD 12/12/2018 9:05 AM

## 2018-12-14 ENCOUNTER — Inpatient Hospital Stay: Payer: No Typology Code available for payment source

## 2018-12-14 ENCOUNTER — Other Ambulatory Visit: Payer: Self-pay

## 2018-12-14 DIAGNOSIS — Z5111 Encounter for antineoplastic chemotherapy: Secondary | ICD-10-CM | POA: Diagnosis not present

## 2018-12-14 DIAGNOSIS — Z7189 Other specified counseling: Secondary | ICD-10-CM

## 2018-12-14 DIAGNOSIS — C155 Malignant neoplasm of lower third of esophagus: Secondary | ICD-10-CM

## 2018-12-14 MED ORDER — SODIUM CHLORIDE 0.9% FLUSH
10.0000 mL | INTRAVENOUS | Status: DC | PRN
  Administered 2018-12-14: 12:00:00 10 mL
  Filled 2018-12-14: qty 10

## 2018-12-14 MED ORDER — HEPARIN SOD (PORK) LOCK FLUSH 100 UNIT/ML IV SOLN
500.0000 [IU] | Freq: Once | INTRAVENOUS | Status: AC | PRN
  Administered 2018-12-14: 12:00:00 500 [IU]
  Filled 2018-12-14: qty 5

## 2018-12-14 NOTE — Progress Notes (Signed)
Nutrition Follow-up:  Patient with metastatic GE junction adenocarcinoma.  Patient has completed radiation.  Receiving folfox chemotherapy.    Spoke with patient via phone today.  Reports that he is eating just about anything he wants (meats, eggs, vegetables, etc).  Reports that he really has been trying to stay hydrated as well (water).  Denies nausea.    Medications: reviewed  Labs: reviewed  Anthropometrics:   Weight 299 lb increased from 297 lb on 9/22 but decreased from 305 lb   NUTRITION DIAGNOSIS:  Inadequate oral intake stable   INTERVENTION:  Encouraged patient to continue to chew foods well, add liquid to food to help with dysphagia and less chance blockage.   Patient has contact information    MONITORING, EVALUATION, GOAL: Patient will consume adequate calories and protein to maintain lean muscle mass   NEXT VISIT: phone Thursday, Nov 19th  Matthew Yang B. Zenia Resides, [place], [place] Registered Dietitian [phone] (pager)

## 2018-12-26 ENCOUNTER — Inpatient Hospital Stay (HOSPITAL_BASED_OUTPATIENT_CLINIC_OR_DEPARTMENT_OTHER): Payer: No Typology Code available for payment source | Admitting: Nurse Practitioner

## 2018-12-26 ENCOUNTER — Inpatient Hospital Stay: Payer: No Typology Code available for payment source

## 2018-12-26 ENCOUNTER — Other Ambulatory Visit: Payer: Self-pay

## 2018-12-26 ENCOUNTER — Encounter: Payer: Self-pay | Admitting: Nurse Practitioner

## 2018-12-26 ENCOUNTER — Other Ambulatory Visit: Payer: Self-pay | Admitting: Oncology

## 2018-12-26 DIAGNOSIS — C155 Malignant neoplasm of lower third of esophagus: Secondary | ICD-10-CM | POA: Diagnosis not present

## 2018-12-26 DIAGNOSIS — Z7189 Other specified counseling: Secondary | ICD-10-CM

## 2018-12-26 DIAGNOSIS — Z5111 Encounter for antineoplastic chemotherapy: Secondary | ICD-10-CM | POA: Diagnosis not present

## 2018-12-26 LAB — COMPREHENSIVE METABOLIC PANEL
ALT: 32 U/L (ref 0–44)
AST: 35 U/L (ref 15–41)
Albumin: 3.7 g/dL (ref 3.5–5.0)
Alkaline Phosphatase: 120 U/L (ref 38–126)
Anion gap: 7 (ref 5–15)
BUN: 13 mg/dL (ref 6–20)
CO2: 22 mmol/L (ref 22–32)
Calcium: 8.7 mg/dL — ABNORMAL LOW (ref 8.9–10.3)
Chloride: 106 mmol/L (ref 98–111)
Creatinine, Ser: 0.8 mg/dL (ref 0.61–1.24)
GFR calc Af Amer: >60 mL/min (ref >60)
GFR calc non Af Amer: >60 mL/min (ref >60)
Glucose, Bld: 205 mg/dL — ABNORMAL HIGH (ref 70–99)
Potassium: 3.9 mmol/L (ref 3.5–5.1)
Sodium: 135 mmol/L (ref 135–145)
Total Bilirubin: 1.5 mg/dL — ABNORMAL HIGH (ref 0.3–1.2)
Total Protein: 6.9 g/dL (ref 6.5–8.1)

## 2018-12-26 LAB — CBC WITH DIFFERENTIAL/PLATELET
Abs Immature Granulocytes: 0.03 10*3/uL (ref 0.00–0.07)
Basophils Absolute: 0 10*3/uL (ref 0.0–0.1)
Basophils Relative: 0 %
Eosinophils Absolute: 0.3 10*3/uL (ref 0.0–0.5)
Eosinophils Relative: 5 %
HCT: 37.4 % — ABNORMAL LOW (ref 39.0–52.0)
Hemoglobin: 12.9 g/dL — ABNORMAL LOW (ref 13.0–17.0)
Immature Granulocytes: 1 %
Lymphocytes Relative: 26 %
Lymphs Abs: 1.3 10*3/uL (ref 0.7–4.0)
MCH: 31.1 pg (ref 26.0–34.0)
MCHC: 34.5 g/dL (ref 30.0–36.0)
MCV: 90.1 fL (ref 80.0–100.0)
Monocytes Absolute: 1.2 10*3/uL — ABNORMAL HIGH (ref 0.1–1.0)
Monocytes Relative: 24 %
Neutro Abs: 2.2 10*3/uL (ref 1.7–7.7)
Neutrophils Relative %: 44 %
Platelets: 111 10*3/uL — ABNORMAL LOW (ref 150–400)
RBC: 4.15 MIL/uL — ABNORMAL LOW (ref 4.22–5.81)
RDW: 14.6 % (ref 11.5–15.5)
WBC: 5 10*3/uL (ref 4.0–10.5)
nRBC: 0 % (ref 0.0–0.2)

## 2018-12-26 MED ORDER — LEUCOVORIN CALCIUM INJECTION 350 MG
1000.0000 mg | Freq: Once | INTRAVENOUS | Status: AC
  Administered 2018-12-26: 1000 mg via INTRAVENOUS
  Filled 2018-12-26: qty 50

## 2018-12-26 MED ORDER — LEUCOVORIN CALCIUM INJECTION 100 MG: 20.0000 mg/m2 | Freq: Once | INTRAMUSCULAR | Status: DC

## 2018-12-26 MED ORDER — PALONOSETRON HCL INJECTION 0.25 MG/5ML
0.2500 mg | Freq: Once | INTRAVENOUS | Status: AC
  Administered 2018-12-26: 0.25 mg via INTRAVENOUS
  Filled 2018-12-26: qty 5

## 2018-12-26 MED ORDER — DEXAMETHASONE SODIUM PHOSPHATE 10 MG/ML IJ SOLN
10.0000 mg | Freq: Once | INTRAMUSCULAR | Status: AC
  Administered 2018-12-26: 10:00:00 10 mg via INTRAVENOUS
  Filled 2018-12-26: qty 1

## 2018-12-26 MED ORDER — OXALIPLATIN CHEMO INJECTION 100 MG/20ML
60.0000 mg/m2 | Freq: Once | INTRAVENOUS | Status: AC
  Administered 2018-12-26: 160 mg via INTRAVENOUS
  Filled 2018-12-26: qty 32

## 2018-12-26 MED ORDER — SODIUM CHLORIDE 0.9 % IV SOLN
2400.0000 mg/m2 | INTRAVENOUS | Status: DC
  Administered 2018-12-26: 6450 mg via INTRAVENOUS
  Filled 2018-12-26: qty 100

## 2018-12-26 MED ORDER — DEXTROSE 5 % IV SOLN
Freq: Once | INTRAVENOUS | Status: AC
  Administered 2018-12-26: 10:00:00 via INTRAVENOUS
  Filled 2018-12-26: qty 250

## 2018-12-26 NOTE — Assessment & Plan Note (Signed)
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  Currently receiving FOLFOX chemotherapy with palliative intent.  November 27, 2018 PET scan showed considerable decrease in size and degree of FDG uptake with previous hypermetabolic distal esophageal mass that extends into the stomach, decrease in size and FDG uptake of upper abdominal nodal mets.  No new sites of disease.  Liver suggestive of early cirrhosis.  Findings consistent with significant partial response.  Patient has multiple questions regarding his treatment today which were discussed in detail including different types of cancer and use of national guidelines and selection of treatments.  We reviewed side effects of treatment including numbness and tingling and taste changes which he has experienced though appear to be mild and overall he is tolerating treatment well.   #Labs reviewed with patient and acceptable for treatment.  Proceed with cycle 9 of FOLFOX chemotherapy today  #Thrombocytopenia-platelets 111, improved to grade 1.  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  Continue to monitor.  #Anemia-hemoglobin 12.9 today.  Improved/stable.  Likely secondary to chemotherapy.  #Dysphagia/difficulty swallowing-likely secondary to malignancy.  No evidence of mucositis or stomatitis.  Continue to monitor.  #Right kidney stone-resolved  Disposition: #Proceed with cycle 9 of FOLFOX chemotherapy today.  Pump off in 2 days #Return to clinic in 2 weeks for labs (CBC and CMP), covering provider, and consideration of FOLFOX with pump off 2 days later.

## 2018-12-26 NOTE — Progress Notes (Signed)
[facility] OFFICE PROGRESS NOTE  Patient Care Team: Kirk Ruths, MD as PCP - General (Internal Medicine) Lucky Cowboy, Erskine Squibb, MD as Consulting Physician (Vascular Surgery) Clent Jacks, RN as Registered Nurse  CHIEF COMPLAINTS/PURPOSE OF CONSULTATION: Gastroesophageal cancer  #  Oncology History Overview Note  # June 2020-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  # June rd week- 5FU pump every 2 days; +RT [until aug 4th]  # 10/16/2018-FOLFOX #1; 9/21-PET- PR. 11/28/2018- cycle #6-decreased the ox dose to 60 mg/2 [sec to platelets- 94].   # DM- diet controlled; Hepatitis C; September 2020-right kidney stone/spontaneous; foreign body impaction/spontaneous  # NGS/omniseq- PDL-1 CPS 20%;EGFR-copy number gain; NEG- targets/ Her-2 neu.   DIAGNOSIS: Adenocarcinoma esophagus/GE junction  STAGE: IV     ;GOALS: pallaitive  CURRENT/MOST RECENT THERAPY ; FOLFOX every 2 weeks      Malignant neoplasm of lower third of esophagus ([place])  08/30/2018 -  Chemotherapy   The patient had palonosetron (ALOXI) injection 0.25 mg, 0.25 mg, Intravenous,  Once, 5 of 10 cycles Administration: 0.25 mg (10/30/2018), 0.25 mg (10/16/2018), 0.25 mg (11/28/2018), 0.25 mg (12/12/2018), 0.25 mg (11/14/2018) leucovorin 1,072 mg in dextrose 5 % 250 mL infusion, 400 mg/m2 = 1,072 mg, Intravenous,  Once, 7 of 12 cycles Administration: 1,000 mg (10/30/2018), 1,000 mg (10/16/2018), 1,000 mg  (11/28/2018), 1,000 mg (12/12/2018) oxaliplatin (ELOXATIN) 230 mg in dextrose 5 % 500 mL chemo infusion, 85 mg/m2 = 230 mg, Intravenous,  Once, 5 of 10 cycles Dose modification: 60 mg/m2 (original dose 85 mg/m2, Cycle 7, Reason: Provider Judgment) Administration: 230 mg (10/30/2018), 230 mg (10/16/2018), 160 mg (11/28/2018), 160 mg (12/12/2018), 230 mg (11/14/2018) leucovorin injection 54 mg, 20 mg/m2 = 54 mg (100 % of original dose 20 mg/m2), Intravenous,  Once, 7 of 12 cycles Dose modification: 20 mg/m2 (original dose 20 mg/m2, Cycle 2, Reason: Other (see comments), Comment: iv push), 20 mg/m2 (original dose 20 mg/m2, Cycle 3, Reason: Other (see comments), Comment: ivp) Administration: 54 mg (09/13/2018), 54 mg (10/02/2018) fluorouracil (ADRUCIL) 6,450 mg in sodium chloride 0.9 % 121 mL chemo infusion, 2,400 mg/m2 = 6,450 mg, Intravenous, 1 Day/Dose, 8 of 13 cycles Administration: 6,450 mg (08/30/2018), 6,450 mg (09/13/2018), 6,450 mg (10/30/2018), 6,450 mg (10/02/2018), 6,450 mg (10/16/2018), 6,450 mg (11/28/2018), 6,450 mg (12/12/2018), 6,450 mg (11/14/2018)  for chemotherapy treatment.      HISTORY OF PRESENTING ILLNESS:  Matthew Yang 58 y.o.  male diagnosed with stage IV adenocarcinoma of the esophagus, currently on FOLFOX chemotherapy, returns to clinic today for follow-up and consideration of continuation of FOLFOX chemotherapy.  He says he continues to feel at baseline.  Has some taste changes and numbness and tingling at the fingertips in the week following chemotherapy but symptoms resolved prior to next cycle.  He continues to work full-time.  Has been preparing sweet foods.  No recent fevers or infections.  No abnormal bruising or bleeding.  No shortness of breath or chest pain.  No nausea or vomiting.  No diarrhea constipation.  Denies pain.  Denies mouth sores.  He has  several questions today about his chemotherapy regimen.  He has friends who have taking chemotherapy for different types of cancer and he  questions similarities and differences.  He also questions duration of treatment and how one treatment is selected versus another.   Review of Systems  Constitutional: Positive for malaise/fatigue. Negative for chills, diaphoresis and fever.  HENT: Negative for nosebleeds and sore throat.   Eyes: Negative for double vision.  Respiratory: Negative for cough, hemoptysis, sputum production, shortness of breath and wheezing.   Cardiovascular: Negative for chest pain, palpitations, orthopnea and leg swelling.  Gastrointestinal: Negative for abdominal pain, blood in stool, constipation, diarrhea, heartburn, melena and nausea.  Genitourinary: Negative for dysuria, frequency and urgency.  Musculoskeletal: Negative for back pain and joint pain.  Skin: Negative.  Negative for itching and rash.  Neurological: Positive for tingling. Negative for dizziness, focal weakness, weakness and headaches.  Endo/Heme/Allergies: Does not bruise/bleed easily.  Psychiatric/Behavioral: Negative for depression. The patient is not nervous/anxious and does not have insomnia.     Medical history, surgical history, social history, family history, allergies, and current medications were reviewed with patient and updated as below.  MEDICAL HISTORY:  Past Medical History:  Diagnosis Date  . Diabetes mellitus without complication ([place])   . Esophagus cancer ([place])   . Hepatitis    HEPATITIS C  . Hypercholesteremia   . Hypertension   . Morbid obesity ([place])   . Positive hepatitis C antibody test   . Sleep apnea   . Tubular adenoma of colon    polyp    SURGICAL HISTORY: Past Surgical History:  Procedure Laterality Date  . CHOLECYSTECTOMY    . COLONOSCOPY    . COLONOSCOPY WITH PROPOFOL N/A 05/04/2016   Procedure: COLONOSCOPY WITH PROPOFOL;  Surgeon: Lollie Sails, MD;  Location: [facility] ENDOSCOPY;  Service: Endoscopy;  Laterality: N/A;  . ESOPHAGOGASTRODUODENOSCOPY (EGD) WITH PROPOFOL N/A 08/04/2018   Procedure:  ESOPHAGOGASTRODUODENOSCOPY (EGD) WITH PROPOFOL;  Surgeon: Lollie Sails, MD;  Location: [facility] ENDOSCOPY;  Service: Endoscopy;  Laterality: N/A;  . PORTA CATH INSERTION N/A 08/17/2018   Procedure: PORTA CATH INSERTION;  Surgeon: Algernon Huxley, MD;  Location: [facility] CV LAB;  Service: Cardiovascular;  Laterality: N/A;    SOCIAL HISTORY: Social History   Socioeconomic History  . Marital status: Married    Spouse name: Not on file  . Number of children: Not on file  . Years of education: Not on file  . Highest education level: Not on file  Occupational History  . Not on file  Social Needs  . Financial resource strain: Not on file  . Food insecurity    Worry: Not on file    Inability: Not on file  . Transportation needs    Medical: Not on file    Non-medical: Not on file  Tobacco Use  . Smoking status: Never Smoker  . Smokeless tobacco: Never Used  Substance and Sexual Activity  . Alcohol use: Not Currently  . Drug use: No  . Sexual activity: Not on file  Lifestyle  . Physical activity    Days per week: Not on file    Minutes per session: Not on file  . Stress: Not on file  Relationships  . Social Herbalist on phone: Not on file    Gets together: Not on file    Attends religious service: Not on file    Active member of club or organization: Not on file    Attends meetings  of clubs or organizations: Not on file    Relationship status: Not on file  . Intimate partner violence    Fear of current or ex partner: Not on file    Emotionally abused: Not on file    Physically abused: Not on file    Forced sexual activity: Not on file  Other Topics Concern  . 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     FAMILY HISTORY: Family History  Family history unknown: Yes    ALLERGIES:  has No Known Allergies.  MEDICATIONS:  Current Outpatient Medications  Medication Sig Dispense Refill   . atorvastatin (LIPITOR) 20 MG tablet Take 20 mg by mouth daily.    Marland Kitchen lidocaine-prilocaine (EMLA) cream Apply 1 application topically as needed. 30 g 4  . lisinopril (PRINIVIL,ZESTRIL) 20 MG tablet Take 20 mg by mouth daily.    . Multiple Vitamin (MULTIVITAMIN WITH MINERALS) TABS tablet Take 1 tablet by mouth daily.    . Omega-3 Fatty Acids (FISH OIL) 1000 MG CAPS Take 1,000 mg by mouth daily.     . ondansetron (ZOFRAN) 8 MG tablet Take 1 tablet (8 mg total) by mouth every 8 (eight) hours as needed for nausea or vomiting. 20 tablet 4  . pantoprazole (PROTONIX) 40 MG tablet Take 40 mg by mouth daily.    . prochlorperazine (COMPAZINE) 10 MG tablet Take 1 tablet (10 mg total) by mouth every 6 (six) hours as needed for nausea or vomiting. 30 tablet 4  . Turmeric 500 MG CAPS Take 500 mg by mouth daily.     No current facility-administered medications for this visit.     PHYSICAL EXAMINATION: ECOG PERFORMANCE STATUS: 1 - Symptomatic but completely ambulatory  Vitals:   12/26/18 0831  BP: 112/70  Pulse: 71  Resp: (!) 22  Temp: (!) 95.4 F (35.2 C)   Filed Weights   12/26/18 0831  Weight: 298 lb (135.2 kg)    Physical Exam  Constitutional: He is oriented to person, place, and time and well-developed, well-nourished, and in no distress.  Unaccompanied, wearing mask  HENT:  Head: Normocephalic and atraumatic.  Mouth/Throat: Oropharynx is clear and moist. No oropharyngeal exudate.  Eyes: Conjunctivae are normal. No scleral icterus.  Neck: Normal range of motion. Neck supple.  Cardiovascular: Normal rate and regular rhythm.  Pulmonary/Chest: Effort normal and breath sounds normal. No respiratory distress. He has no wheezes.  Abdominal: Soft. Bowel sounds are normal. He exhibits no distension and no mass. There is no abdominal tenderness. There is no rebound and no guarding.  BMI 41.56  Musculoskeletal:        General: No tenderness or edema.  Neurological: He is alert and oriented to  person, place, and time.  No aids for ambulation  Skin: Skin is warm.  Psychiatric: Affect normal. His mood appears anxious.     LABORATORY DATA:  I have reviewed the data as listed Lab Results  Component Value Date   WBC 5.0 12/26/2018   HGB 12.9 (L) 12/26/2018   HCT 37.4 (L) 12/26/2018   MCV 90.1 12/26/2018   PLT 111 (L) 12/26/2018   Recent Labs    11/28/18 0810 12/12/18 0820 12/26/18 0805  NA 139 138 135  K 3.7 3.8 3.9  CL 109 108 106  CO2 '24 24 22  ' GLUCOSE 198* 185* 205*  BUN '14 16 13  ' CREATININE 0.83 0.75 0.80  CALCIUM 8.6* 8.7* 8.7*  GFRNONAA >60 >60 >60  GFRAA >60 >60 >60  PROT 6.5 6.7 6.9  ALBUMIN 3.5 3.6 3.7  AST 27 30 35  ALT 29 27 32  ALKPHOS 106 113 120  BILITOT 1.0 1.2 1.5*    RADIOGRAPHIC STUDIES: I have personally reviewed the radiological images as listed and agreed with the findings in the report. Nm Pet Image Restag (ps) Skull Base To Thigh  Result Date: 11/27/2018 CLINICAL DATA:  Subsequent treatment strategy for esophageal carcinoma. EXAM: NUCLEAR MEDICINE PET SKULL BASE TO THIGH TECHNIQUE: 14.5 mCi F-18 FDG was injected intravenously. Full-ring PET imaging was performed from the skull base to thigh after the radiotracer. CT data was obtained and used for attenuation correction and anatomic localization. Fasting blood glucose: 100 mg/dl COMPARISON:  08/18/2018 FINDINGS: Mediastinal blood pool activity: SUV max 3.46. Liver activity: SUV max NA NECK: No hypermetabolic lymph nodes in the neck. Incidental CT findings: none CHEST: There has been considerable decrease in size and FDG uptake associated with distal esophageal mass. On the current exam this measures approximately 4 cm with SUV max of 5.04. On the previous exam this measured approximately 8.8 cm within SUV max of 29.5. No hypermetabolic supraclavicular, axillary, mediastinal, or hilar lymph nodes. No hypermetabolic pulmonary nodules. Incidental CT findings: Aortic atherosclerosis. Three vessel  coronary artery atherosclerotic calcifications. ABDOMEN/PELVIS: The liver has a nodular contour suggestive of cirrhosis. No FDG avid liver lesions identified. Pancreas negative. Spleen negative. Adrenal glands are unremarkable. Interval improvement in upper abdominal adenopathy. Index gastrohepatic ligament node measures 1 cm within SUV max of 3.1. This is compared with 2 cm previously within SUV max of 22. Porta hepatic lymph node measures 0.6 cm within SUV max of 3.7. Previously this measured 1.7 cm with SUV max of 36.6. Short axis portacaval node measures 1.1 cm within SUV max of 6.88. Previously 2 cm with SUV max of 41.05. Resolution of previous FDG avid aortocaval lymph nodes which had an SUV max of 14.68 previously. No FDG avid pelvic or inguinal lymph nodes identified. Incidental CT findings: Aortic atherosclerosis. Previous cholecystectomy. No ascites or focal fluid collections. SKELETON: No focal hypermetabolic activity to suggest skeletal metastasis. Incidental CT findings: None IMPRESSION: 1. Interval response to therapy. Considerable decrease in size and degree of FDG uptake associated with previous hypermetabolic distal esophageal mass extending into the stomach. 2. Decrease in size and degree of FDG uptake associated with previously noted FDG avid upper abdominal nodal metastases. 3. No new sites of disease. 4.  Aortic Atherosclerosis (ICD10-I70.0). 5. Morphologic features of the liver suggestive of early cirrhosis. Electronically Signed   By: Kerby Moors M.D.   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  Currently receiving FOLFOX chemotherapy with palliative intent.  November 27, 2018 PET scan showed considerable decrease in size and degree of FDG uptake with previous hypermetabolic distal esophageal mass that extends  into the stomach, decrease in size and FDG uptake of upper abdominal nodal mets.  No new sites of disease.  Liver suggestive of early cirrhosis.  Findings consistent with significant partial response.  Patient has multiple questions regarding his treatment today which were discussed in detail including different types of cancer and use of national guidelines and selection of treatments.  We reviewed side effects of treatment including numbness and tingling and taste changes which he has experienced though appear to be mild and  overall he is tolerating treatment well.   #Labs reviewed with patient and acceptable for treatment.  Proceed with cycle 9 of FOLFOX chemotherapy today  #Thrombocytopenia-platelets 111, improved to grade 1.  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  Continue to monitor.  #Anemia-hemoglobin 12.9 today.  Improved/stable.  Likely secondary to chemotherapy.  #Dysphagia/difficulty swallowing-likely secondary to malignancy.  No evidence of mucositis or stomatitis.  Continue to monitor.  #Right kidney stone-resolved  Disposition: #Proceed with cycle 9 of FOLFOX chemotherapy today.  Pump off in 2 days #Return to clinic in 2 weeks for labs (CBC and CMP), covering provider, and consideration of FOLFOX with pump off 2 days later.   All questions were answered. The patient knows to call the clinic with any problems, questions or concerns.  A total of (35) minutes of face-to-face time was spent with this patient with greater than 50% of that time in counseling and care-coordination.  Beckey Rutter, DNP, AGNP-C [facility]: Dr. Rogue Bussing

## 2018-12-28 ENCOUNTER — Inpatient Hospital Stay: Payer: No Typology Code available for payment source

## 2018-12-28 ENCOUNTER — Other Ambulatory Visit: Payer: Self-pay

## 2018-12-28 VITALS — BP 113/73 | HR 80 | Resp 18

## 2018-12-28 DIAGNOSIS — Z5111 Encounter for antineoplastic chemotherapy: Secondary | ICD-10-CM | POA: Diagnosis not present

## 2018-12-28 DIAGNOSIS — C155 Malignant neoplasm of lower third of esophagus: Secondary | ICD-10-CM

## 2018-12-28 DIAGNOSIS — Z7189 Other specified counseling: Secondary | ICD-10-CM

## 2018-12-28 MED ORDER — SODIUM CHLORIDE 0.9% FLUSH
10.0000 mL | INTRAVENOUS | Status: DC | PRN
  Administered 2018-12-28: 10 mL
  Filled 2018-12-28: qty 10

## 2018-12-28 MED ORDER — HEPARIN SOD (PORK) LOCK FLUSH 100 UNIT/ML IV SOLN
500.0000 [IU] | Freq: Once | INTRAVENOUS | Status: AC | PRN
  Administered 2018-12-28: 500 [IU]
  Filled 2018-12-28: qty 5

## 2019-01-08 ENCOUNTER — Encounter: Payer: Self-pay | Admitting: Internal Medicine

## 2019-01-09 ENCOUNTER — Inpatient Hospital Stay: Payer: No Typology Code available for payment source

## 2019-01-09 ENCOUNTER — Other Ambulatory Visit: Payer: Self-pay

## 2019-01-09 ENCOUNTER — Inpatient Hospital Stay: Payer: No Typology Code available for payment source | Attending: Oncology

## 2019-01-09 ENCOUNTER — Inpatient Hospital Stay (HOSPITAL_BASED_OUTPATIENT_CLINIC_OR_DEPARTMENT_OTHER): Payer: No Typology Code available for payment source | Admitting: Internal Medicine

## 2019-01-09 DIAGNOSIS — Z5111 Encounter for antineoplastic chemotherapy: Secondary | ICD-10-CM | POA: Insufficient documentation

## 2019-01-09 DIAGNOSIS — K7460 Unspecified cirrhosis of liver: Secondary | ICD-10-CM | POA: Insufficient documentation

## 2019-01-09 DIAGNOSIS — G620 Drug-induced polyneuropathy: Secondary | ICD-10-CM | POA: Diagnosis not present

## 2019-01-09 DIAGNOSIS — E119 Type 2 diabetes mellitus without complications: Secondary | ICD-10-CM | POA: Diagnosis not present

## 2019-01-09 DIAGNOSIS — C155 Malignant neoplasm of lower third of esophagus: Secondary | ICD-10-CM

## 2019-01-09 DIAGNOSIS — G4730 Sleep apnea, unspecified: Secondary | ICD-10-CM | POA: Diagnosis not present

## 2019-01-09 DIAGNOSIS — R5381 Other malaise: Secondary | ICD-10-CM | POA: Insufficient documentation

## 2019-01-09 DIAGNOSIS — K1232 Oral mucositis (ulcerative) due to other drugs: Secondary | ICD-10-CM | POA: Insufficient documentation

## 2019-01-09 DIAGNOSIS — R5383 Other fatigue: Secondary | ICD-10-CM | POA: Insufficient documentation

## 2019-01-09 DIAGNOSIS — I10 Essential (primary) hypertension: Secondary | ICD-10-CM | POA: Diagnosis not present

## 2019-01-09 DIAGNOSIS — T451X5A Adverse effect of antineoplastic and immunosuppressive drugs, initial encounter: Secondary | ICD-10-CM | POA: Insufficient documentation

## 2019-01-09 DIAGNOSIS — Z95828 Presence of other vascular implants and grafts: Secondary | ICD-10-CM

## 2019-01-09 DIAGNOSIS — D696 Thrombocytopenia, unspecified: Secondary | ICD-10-CM | POA: Insufficient documentation

## 2019-01-09 DIAGNOSIS — Z79899 Other long term (current) drug therapy: Secondary | ICD-10-CM | POA: Diagnosis not present

## 2019-01-09 DIAGNOSIS — E7800 Pure hypercholesterolemia, unspecified: Secondary | ICD-10-CM | POA: Diagnosis not present

## 2019-01-09 DIAGNOSIS — Z7189 Other specified counseling: Secondary | ICD-10-CM

## 2019-01-09 LAB — COMPREHENSIVE METABOLIC PANEL
ALT: 31 U/L (ref 0–44)
AST: 34 U/L (ref 15–41)
Albumin: 3.4 g/dL — ABNORMAL LOW (ref 3.5–5.0)
Alkaline Phosphatase: 134 U/L — ABNORMAL HIGH (ref 38–126)
Anion gap: 7 (ref 5–15)
BUN: 15 mg/dL (ref 6–20)
CO2: 23 mmol/L (ref 22–32)
Calcium: 8.5 mg/dL — ABNORMAL LOW (ref 8.9–10.3)
Chloride: 108 mmol/L (ref 98–111)
Creatinine, Ser: 0.83 mg/dL (ref 0.61–1.24)
GFR calc Af Amer: >60 mL/min (ref >60)
GFR calc non Af Amer: >60 mL/min (ref >60)
Glucose, Bld: 255 mg/dL — ABNORMAL HIGH (ref 70–99)
Potassium: 3.7 mmol/L (ref 3.5–5.1)
Sodium: 138 mmol/L (ref 135–145)
Total Bilirubin: 1.1 mg/dL (ref 0.3–1.2)
Total Protein: 6.5 g/dL (ref 6.5–8.1)

## 2019-01-09 LAB — CBC WITH DIFFERENTIAL/PLATELET
Abs Immature Granulocytes: 0.01 10*3/uL (ref 0.00–0.07)
Basophils Absolute: 0 10*3/uL (ref 0.0–0.1)
Basophils Relative: 1 %
Eosinophils Absolute: 0.2 10*3/uL (ref 0.0–0.5)
Eosinophils Relative: 5 %
HCT: 36 % — ABNORMAL LOW (ref 39.0–52.0)
Hemoglobin: 12.3 g/dL — ABNORMAL LOW (ref 13.0–17.0)
Immature Granulocytes: 0 %
Lymphocytes Relative: 39 %
Lymphs Abs: 1.8 10*3/uL (ref 0.7–4.0)
MCH: 31.5 pg (ref 26.0–34.0)
MCHC: 34.2 g/dL (ref 30.0–36.0)
MCV: 92.3 fL (ref 80.0–100.0)
Monocytes Absolute: 0.9 10*3/uL (ref 0.1–1.0)
Monocytes Relative: 21 %
Neutro Abs: 1.5 10*3/uL — ABNORMAL LOW (ref 1.7–7.7)
Neutrophils Relative %: 34 %
Platelets: 115 10*3/uL — ABNORMAL LOW (ref 150–400)
RBC: 3.9 MIL/uL — ABNORMAL LOW (ref 4.22–5.81)
RDW: 14.4 % (ref 11.5–15.5)
WBC: 4.3 10*3/uL (ref 4.0–10.5)
nRBC: 0 % (ref 0.0–0.2)

## 2019-01-09 MED ORDER — SODIUM CHLORIDE 0.9 % IV SOLN
2400.0000 mg/m2 | INTRAVENOUS | Status: DC
  Administered 2019-01-09: 6450 mg via INTRAVENOUS
  Filled 2019-01-09: qty 100

## 2019-01-09 MED ORDER — DEXTROSE 5 % IV SOLN
Freq: Once | INTRAVENOUS | Status: AC
  Administered 2019-01-09: 09:00:00 via INTRAVENOUS
  Filled 2019-01-09: qty 250

## 2019-01-09 MED ORDER — PALONOSETRON HCL INJECTION 0.25 MG/5ML
0.2500 mg | Freq: Once | INTRAVENOUS | Status: AC
  Administered 2019-01-09: 09:00:00 0.25 mg via INTRAVENOUS
  Filled 2019-01-09: qty 5

## 2019-01-09 MED ORDER — OXALIPLATIN CHEMO INJECTION 100 MG/20ML
60.0000 mg/m2 | Freq: Once | INTRAVENOUS | Status: AC
  Administered 2019-01-09: 160 mg via INTRAVENOUS
  Filled 2019-01-09: qty 32

## 2019-01-09 MED ORDER — LEUCOVORIN CALCIUM INJECTION 350 MG
1000.0000 mg | Freq: Once | INTRAVENOUS | Status: AC
  Administered 2019-01-09: 1000 mg via INTRAVENOUS
  Filled 2019-01-09: qty 50

## 2019-01-09 MED ORDER — DEXAMETHASONE SODIUM PHOSPHATE 10 MG/ML IJ SOLN
10.0000 mg | Freq: Once | INTRAMUSCULAR | Status: AC
  Administered 2019-01-09: 10 mg via INTRAVENOUS
  Filled 2019-01-09: qty 1

## 2019-01-09 MED ORDER — SODIUM CHLORIDE 0.9% FLUSH
10.0000 mL | Freq: Once | INTRAVENOUS | Status: AC
  Administered 2019-01-09: 10 mL via INTRAVENOUS
  Filled 2019-01-09: qty 10

## 2019-01-09 MED ORDER — HEPARIN SOD (PORK) LOCK FLUSH 100 UNIT/ML IV SOLN
500.0000 [IU] | Freq: Once | INTRAVENOUS | Status: DC | PRN
  Filled 2019-01-09: qty 5

## 2019-01-09 NOTE — Progress Notes (Signed)
I connected with Matthew Yang on 01/09/19 at  8:30 AM EST by video enabled telemedicine visit and verified that I am speaking with the correct person using two identifiers.  I discussed the limitations, risks, security and privacy concerns of performing an evaluation and management service by telemedicine and the availability of in-person appointments. I also discussed with the patient that there may be a patient responsible charge related to this service. The patient expressed understanding and agreed to proceed.    Other persons participating in the visit and their role in the encounter: RN/medical reconciliation Patient's location: Office Provider's location: Home  Oncology History Overview Note  # June 2020-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  # June rd week- 5FU pump every 2 days; +RT [until aug 4th]  # 10/16/2018-FOLFOX #1; 9/21-PET- PR. 11/28/2018- cycle #6-decreased the ox dose to 60 mg/2 [sec to platelets- 94].   # DM- diet controlled; Hepatitis C; September 2020-right kidney stone/spontaneous; foreign body impaction/spontaneous  # NGS/omniseq- PDL-1 CPS 20%;EGFR-copy number gain; NEG- targets/ Her-2 neu.   DIAGNOSIS: Adenocarcinoma esophagus/GE junction  STAGE: IV     ;GOALS: pallaitive  CURRENT/MOST RECENT THERAPY ; FOLFOX every 2 weeks      Malignant neoplasm of lower third of esophagus ([place])  08/30/2018 -  Chemotherapy   The patient had palonosetron  (ALOXI) injection 0.25 mg, 0.25 mg, Intravenous,  Once, 7 of 10 cycles Administration: 0.25 mg (10/30/2018), 0.25 mg (10/16/2018), 0.25 mg (11/28/2018), 0.25 mg (12/12/2018), 0.25 mg (11/14/2018), 0.25 mg (12/26/2018) leucovorin 1,072 mg in dextrose 5 % 250 mL infusion, 400 mg/m2 = 1,072 mg, Intravenous,  Once, 9 of 12 cycles Administration: 1,000 mg (10/30/2018), 1,000 mg (10/16/2018), 1,000 mg (11/28/2018), 1,000 mg (12/12/2018), 1,000 mg (12/26/2018) oxaliplatin (ELOXATIN) 230 mg in dextrose 5 % 500 mL chemo infusion, 85 mg/m2 = 230 mg, Intravenous,  Once, 7 of 10 cycles Dose modification: 60 mg/m2 (original dose 85 mg/m2, Cycle 7, Reason: Provider Judgment) Administration: 230 mg (10/30/2018), 230 mg (10/16/2018), 160 mg (11/28/2018), 160 mg (12/12/2018), 230 mg (11/14/2018), 160 mg (12/26/2018) leucovorin injection 54 mg, 20 mg/m2 = 54 mg (100 % of original dose 20 mg/m2), Intravenous,  Once, 8 of 8 cycles Dose modification: 20 mg/m2 (original dose 20 mg/m2, Cycle 2, Reason: Other (see comments), Comment: iv push), 20 mg/m2 (original dose 20 mg/m2, Cycle 3, Reason: Other (see comments), Comment: ivp) Administration: 54 mg (09/13/2018), 54 mg (10/02/2018) fluorouracil (ADRUCIL) 6,450 mg in sodium chloride 0.9 % 121 mL chemo infusion, 2,400 mg/m2 = 6,450 mg, Intravenous, 1 Day/Dose, 10 of 13 cycles Administration: 6,450 mg (08/30/2018), 6,450 mg (09/13/2018), 6,450 mg (10/30/2018), 6,450 mg (10/02/2018), 6,450 mg (10/16/2018), 6,450 mg (11/28/2018), 6,450 mg (12/12/2018), 6,450 mg (11/14/2018), 6,450 mg (12/26/2018)  for chemotherapy treatment.       Chief Complaint: Metastatic adenocarcinoma esophagus   History of present illness:Matthew Yang 58 y.o.  male with history of metastatic adenocarcinoma esophagus currently on FOLFOX is here for follow-up.  Patient continues to have intermittent tingling and numbness of his extremities-however this does not limit his daily activities.  More so in the first week/with cold.   Improves with rest of the week.  Currently none.  No significant nausea vomiting.  No further episodes of foreign object impaction.  Appetite is good.  No weight loss.  Observation/objective: White count 4.5 hemoglobin 12.5 platelets 113.  Chemistries normal.  Assessment and plan: Malignant neoplasm of lower third of esophagus (HCC) #Adenocarcinoma [poorly differentiated/question signet ring morphology] lower third of esophagus/GE junction. Stage IV.  Currently on FOLFOX.  November 27, 2018-PET scan-significant partial response.   # proceed with cycle # 10 of FOLFOX today. Labs today reviewed;  acceptable for treatment today. Will plan to get repeat imaging in mid-dec; and then will   # Thrombocytopenia-platelets 113; grade1 from oxaliplatin/early cirrhosis; stable.   # PN-1- cold induced- sec to Oxaliplatin. Stable.   # DISPOSITION: # Treatment today; pump off in 2 days.  # follow up in 2 weeks- MD; labs- cbc/cmp- FOLFOX; pump of in 2 days. - Dr.B   Follow-up instructions:  I discussed the assessment and treatment plan with the patient.  The patient was provided an opportunity to ask questions and all were answered.  The patient agreed with the plan and demonstrated understanding of instructions.  The patient was advised to call back or seek an in person evaluation if the symptoms worsen or if the condition fails to improve as anticipated.  Dr. Charlaine Dalton [place] at [facility] 01/09/2019 9:09 AM

## 2019-01-09 NOTE — Assessment & Plan Note (Addendum)
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  Currently on FOLFOX.  November 27, 2018-PET scan-significant partial response.   # proceed with cycle # 10 of FOLFOX today. Labs today reviewed;  acceptable for treatment today. Will plan to get repeat imaging in mid-dec; and then will   # Thrombocytopenia-platelets 113; grade1 from oxaliplatin/early cirrhosis; stable.   # PN-1- cold induced- sec to Oxaliplatin. Stable.   # DISPOSITION: # Treatment today; pump off in 2 days.  # follow up in 2 weeks- MD; labs- cbc/cmp- FOLFOX; pump of in 2 days. - Dr.B

## 2019-01-11 ENCOUNTER — Other Ambulatory Visit: Payer: Self-pay

## 2019-01-11 ENCOUNTER — Inpatient Hospital Stay: Payer: No Typology Code available for payment source

## 2019-01-11 VITALS — BP 133/77 | HR 75 | Temp 97.1°F | Resp 18

## 2019-01-11 DIAGNOSIS — Z5111 Encounter for antineoplastic chemotherapy: Secondary | ICD-10-CM | POA: Diagnosis not present

## 2019-01-11 DIAGNOSIS — Z7189 Other specified counseling: Secondary | ICD-10-CM

## 2019-01-11 DIAGNOSIS — C155 Malignant neoplasm of lower third of esophagus: Secondary | ICD-10-CM

## 2019-01-11 MED ORDER — HEPARIN SOD (PORK) LOCK FLUSH 100 UNIT/ML IV SOLN
500.0000 [IU] | Freq: Once | INTRAVENOUS | Status: AC | PRN
  Administered 2019-01-11: 500 [IU]

## 2019-01-22 ENCOUNTER — Encounter: Payer: Self-pay | Admitting: Internal Medicine

## 2019-01-22 ENCOUNTER — Other Ambulatory Visit: Payer: Self-pay

## 2019-01-23 ENCOUNTER — Inpatient Hospital Stay (HOSPITAL_BASED_OUTPATIENT_CLINIC_OR_DEPARTMENT_OTHER): Payer: No Typology Code available for payment source | Admitting: Internal Medicine

## 2019-01-23 ENCOUNTER — Inpatient Hospital Stay: Payer: No Typology Code available for payment source

## 2019-01-23 ENCOUNTER — Other Ambulatory Visit: Payer: Self-pay

## 2019-01-23 DIAGNOSIS — Z7189 Other specified counseling: Secondary | ICD-10-CM

## 2019-01-23 DIAGNOSIS — C155 Malignant neoplasm of lower third of esophagus: Secondary | ICD-10-CM | POA: Diagnosis not present

## 2019-01-23 DIAGNOSIS — Z5111 Encounter for antineoplastic chemotherapy: Secondary | ICD-10-CM | POA: Diagnosis not present

## 2019-01-23 LAB — CBC WITH DIFFERENTIAL/PLATELET
Abs Immature Granulocytes: 0 10*3/uL (ref 0.00–0.07)
Basophils Absolute: 0 10*3/uL (ref 0.0–0.1)
Basophils Relative: 0 %
Eosinophils Absolute: 0.3 10*3/uL (ref 0.0–0.5)
Eosinophils Relative: 5 %
HCT: 37.1 % — ABNORMAL LOW (ref 39.0–52.0)
Hemoglobin: 12.6 g/dL — ABNORMAL LOW (ref 13.0–17.0)
Immature Granulocytes: 0 %
Lymphocytes Relative: 34 %
Lymphs Abs: 1.9 10*3/uL (ref 0.7–4.0)
MCH: 31.4 pg (ref 26.0–34.0)
MCHC: 34 g/dL (ref 30.0–36.0)
MCV: 92.5 fL (ref 80.0–100.0)
Monocytes Absolute: 0.9 10*3/uL (ref 0.1–1.0)
Monocytes Relative: 17 %
Neutro Abs: 2.5 10*3/uL (ref 1.7–7.7)
Neutrophils Relative %: 44 %
Platelets: 125 10*3/uL — ABNORMAL LOW (ref 150–400)
RBC: 4.01 MIL/uL — ABNORMAL LOW (ref 4.22–5.81)
RDW: 14 % (ref 11.5–15.5)
WBC: 5.6 10*3/uL (ref 4.0–10.5)
nRBC: 0 % (ref 0.0–0.2)

## 2019-01-23 LAB — COMPREHENSIVE METABOLIC PANEL
ALT: 28 U/L (ref 0–44)
AST: 33 U/L (ref 15–41)
Albumin: 3.5 g/dL (ref 3.5–5.0)
Alkaline Phosphatase: 123 U/L (ref 38–126)
Anion gap: 7 (ref 5–15)
BUN: 12 mg/dL (ref 6–20)
CO2: 23 mmol/L (ref 22–32)
Calcium: 8.6 mg/dL — ABNORMAL LOW (ref 8.9–10.3)
Chloride: 108 mmol/L (ref 98–111)
Creatinine, Ser: 0.73 mg/dL (ref 0.61–1.24)
GFR calc Af Amer: >60 mL/min (ref >60)
GFR calc non Af Amer: >60 mL/min (ref >60)
Glucose, Bld: 176 mg/dL — ABNORMAL HIGH (ref 70–99)
Potassium: 3.8 mmol/L (ref 3.5–5.1)
Sodium: 138 mmol/L (ref 135–145)
Total Bilirubin: 1.3 mg/dL — ABNORMAL HIGH (ref 0.3–1.2)
Total Protein: 6.8 g/dL (ref 6.5–8.1)

## 2019-01-23 MED ORDER — DEXTROSE 5 % IV SOLN
Freq: Once | INTRAVENOUS | Status: AC
  Administered 2019-01-23: 10:00:00 via INTRAVENOUS
  Filled 2019-01-23: qty 250

## 2019-01-23 MED ORDER — DEXAMETHASONE SODIUM PHOSPHATE 10 MG/ML IJ SOLN
10.0000 mg | Freq: Once | INTRAMUSCULAR | Status: AC
  Administered 2019-01-23: 10 mg via INTRAVENOUS
  Filled 2019-01-23: qty 1

## 2019-01-23 MED ORDER — HEPARIN SOD (PORK) LOCK FLUSH 100 UNIT/ML IV SOLN: 500.0000 [IU] | Freq: Once | INTRAVENOUS | Status: DC

## 2019-01-23 MED ORDER — LEUCOVORIN CALCIUM INJECTION 350 MG
373.0000 mg/m2 | Freq: Once | INTRAVENOUS | Status: AC
  Administered 2019-01-23: 1000 mg via INTRAVENOUS
  Filled 2019-01-23: qty 50

## 2019-01-23 MED ORDER — OXALIPLATIN CHEMO INJECTION 100 MG/20ML
60.0000 mg/m2 | Freq: Once | INTRAVENOUS | Status: AC
  Administered 2019-01-23: 160 mg via INTRAVENOUS
  Filled 2019-01-23: qty 32

## 2019-01-23 MED ORDER — SODIUM CHLORIDE 0.9 % IV SOLN
2400.0000 mg/m2 | INTRAVENOUS | Status: DC
  Administered 2019-01-23: 6450 mg via INTRAVENOUS
  Filled 2019-01-23: qty 100

## 2019-01-23 MED ORDER — SODIUM CHLORIDE 0.9% FLUSH
10.0000 mL | Freq: Once | INTRAVENOUS | Status: AC
  Administered 2019-01-23: 10 mL via INTRAVENOUS
  Filled 2019-01-23: qty 10

## 2019-01-23 MED ORDER — PALONOSETRON HCL INJECTION 0.25 MG/5ML
0.2500 mg | Freq: Once | INTRAVENOUS | Status: AC
  Administered 2019-01-23: 0.25 mg via INTRAVENOUS
  Filled 2019-01-23: qty 5

## 2019-01-23 NOTE — Progress Notes (Signed)
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  # June rd week- 5FU pump every 2 days; +RT [until aug 4th]  # 10/16/2018-FOLFOX #1; 9/21-PET- PR. 11/28/2018- cycle #6-decreased the ox dose to 60 mg/2 [sec to platelets- 94].   # DM- diet controlled; Hepatitis C; September 2020-right kidney stone/spontaneous; foreign body impaction/spontaneous  # NGS/omniseq- PDL-1 CPS 20%;EGFR-copy number gain; NEG- targets/ Her-2 neu.   DIAGNOSIS: Adenocarcinoma esophagus/GE junction  STAGE: IV     ;GOALS: pallaitive  CURRENT/MOST RECENT THERAPY ; FOLFOX every 2 weeks      Malignant neoplasm of lower third of esophagus ([place])  08/30/2018 -  Chemotherapy   The patient had palonosetron (ALOXI) injection 0.25 mg, 0.25 mg, Intravenous,  Once, 8 of 10 cycles Administration: 0.25 mg (10/30/2018), 0.25 mg (10/16/2018), 0.25 mg (11/28/2018), 0.25 mg (12/12/2018), 0.25 mg (11/14/2018), 0.25 mg (12/26/2018), 0.25 mg (01/09/2019) leucovorin 1,072 mg in dextrose 5 % 250 mL infusion, 400 mg/m2 = 1,072 mg, Intravenous,  Once, 10 of 12 cycles Administration: 1,000 mg (10/30/2018),  1,000 mg (10/16/2018), 1,000 mg (11/28/2018), 1,000 mg (12/12/2018), 1,000 mg (12/26/2018), 1,000 mg (01/09/2019) oxaliplatin (ELOXATIN) 230 mg in dextrose 5 % 500 mL chemo infusion, 85 mg/m2 = 230 mg, Intravenous,  Once, 8 of 10 cycles Dose modification: 60 mg/m2 (original dose 85 mg/m2, Cycle 7, Reason: Provider Judgment) Administration: 230 mg (10/30/2018), 230 mg (10/16/2018), 160 mg (11/28/2018), 160 mg (12/12/2018), 230 mg (11/14/2018), 160 mg (12/26/2018), 160 mg (01/09/2019) leucovorin injection 54 mg, 20 mg/m2 = 54 mg (100 % of original dose 20 mg/m2), Intravenous,  Once, 8 of 8 cycles Dose modification: 20 mg/m2 (original dose 20 mg/m2, Cycle 2, Reason: Other (see comments), Comment: iv push), 20 mg/m2 (original dose 20 mg/m2, Cycle 3, Reason: Other (see comments), Comment: ivp) Administration: 54 mg (09/13/2018), 54 mg (10/02/2018) fluorouracil (ADRUCIL) 6,450 mg in sodium chloride 0.9 % 121 mL chemo infusion, 2,400 mg/m2 = 6,450 mg, Intravenous, 1 Day/Dose, 11 of 13 cycles Administration: 6,450 mg (08/30/2018), 6,450 mg (09/13/2018), 6,450 mg (10/30/2018), 6,450 mg (10/02/2018), 6,450 mg (10/16/2018), 6,450 mg (11/28/2018), 6,450 mg (12/12/2018), 6,450 mg (11/14/2018), 6,450 mg (12/26/2018), 6,450 mg (01/09/2019)  for chemotherapy treatment.      HISTORY OF PRESENTING ILLNESS:  Matthew Yang 58 y.o.  male stage IV adenocarcinoma esophagus currently on FOLFOX chemotherapy is here.  Patient denies any nausea vomiting.  Complains of mild tingling and numbness in extremities.  Denies any worsening difficulty swallowing.   Review of Systems  Constitutional: Positive for malaise/fatigue. Negative for chills, diaphoresis and fever.  HENT: Negative for nosebleeds and sore throat.   Eyes: Negative for double vision.  Respiratory: Negative for cough, hemoptysis, sputum production, shortness of breath and wheezing.   Cardiovascular: Negative for chest pain, palpitations, orthopnea and leg  swelling.  Gastrointestinal:  Negative for abdominal pain, blood in stool, constipation, diarrhea, heartburn, melena and nausea.  Genitourinary: Negative for dysuria, frequency and urgency.  Musculoskeletal: Negative for back pain and joint pain.  Skin: Negative.  Negative for itching and rash.  Neurological: Positive for tingling. Negative for dizziness, focal weakness, weakness and headaches.  Endo/Heme/Allergies: Does not bruise/bleed easily.  Psychiatric/Behavioral: Negative for depression. The patient is not nervous/anxious and does not have insomnia.      MEDICAL HISTORY:  Past Medical History:  Diagnosis Date  . Diabetes mellitus without complication ([place])   . Esophagus cancer ([place])   . Hepatitis    HEPATITIS C  . Hypercholesteremia   . Hypertension   . Morbid obesity ([place])   . Positive hepatitis C antibody test   . Sleep apnea   . Tubular adenoma of colon    polyp    SURGICAL HISTORY: Past Surgical History:  Procedure Laterality Date  . CHOLECYSTECTOMY    . COLONOSCOPY    . COLONOSCOPY WITH PROPOFOL N/A 05/04/2016   Procedure: COLONOSCOPY WITH PROPOFOL;  Surgeon: Lollie Sails, MD;  Location[facility] ENDOSCOPY;  Service: Endoscopy;  Laterality: N/A;  . ESOPHAGOGASTRODUODENOSCOPY (EGD) WITH PROPOFOL N/A 08/04/2018   Procedure: ESOPHAGOGASTRODUODENOSCOPY (EGD) WITH PROPOFOL;  Surgeon: Lollie Sails, MD;  Location: [facility] ENDOSCOPY;  Service: Endoscopy;  Laterality: N/A;  . PORTA CATH INSERTION N/A 08/17/2018   Procedure: PORTA CATH INSERTION;  Surgeon: Algernon Huxley, MD;  Location: [facility] CV LAB;  Service: Cardiovascular;  Laterality: N/A;    SOCIAL HISTORY: Social History   Socioeconomic History  . Marital status: Married    Spouse name: Not on file  . Number of children: Not on file  . Years of education: Not on file  . Highest education level: Not on file  Occupational History  . Not on file  Social Needs  . Financial resource strain: Not on file  . Food insecurity    Worry: Not  on file    Inability: Not on file  . Transportation needs    Medical: Not on file    Non-medical: Not on file  Tobacco Use  . Smoking status: Never Smoker  . Smokeless tobacco: Never Used  Substance and Sexual Activity  . Alcohol use: Not Currently  . Drug use: No  . Sexual activity: Not on file  Lifestyle  . Physical activity    Days per week: Not on file    Minutes per session: Not on file  . Stress: Not on file  Relationships  . Social Herbalist on phone: Not on file    Gets together: Not on file    Attends religious service: Not on file    Active member of club or organization: Not on file    Attends meetings of clubs or organizations: Not on file    Relationship status: Not on file  . Intimate partner violence    Fear of current or ex partner: Not on file    Emotionally abused: Not on file    Physically abused: Not on file    Forced sexual activity: Not on file  Other Topics Concern  . 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     FAMILY HISTORY: Family History  Family history unknown: Yes    ALLERGIES:  has No Known Allergies.  MEDICATIONS:  Current Outpatient Medications  Medication Sig Dispense  Refill  . atorvastatin (LIPITOR) 20 MG tablet Take 20 mg by mouth daily.    Marland Kitchen lidocaine-prilocaine (EMLA) cream Apply 1 application topically as needed. 30 g 4  . lisinopril (PRINIVIL,ZESTRIL) 20 MG tablet Take 20 mg by mouth daily.    . Multiple Vitamin (MULTIVITAMIN WITH MINERALS) TABS tablet Take 1 tablet by mouth daily.    . Omega-3 Fatty Acids (FISH OIL) 1000 MG CAPS Take 1,000 mg by mouth daily.     . ondansetron (ZOFRAN) 8 MG tablet Take 1 tablet (8 mg total) by mouth every 8 (eight) hours as needed for nausea or vomiting. 20 tablet 4  . pantoprazole (PROTONIX) 40 MG tablet Take 40 mg by mouth daily.    . Turmeric 500 MG CAPS Take 500 mg by mouth daily.    . prochlorperazine  (COMPAZINE) 10 MG tablet Take 1 tablet (10 mg total) by mouth every 6 (six) hours as needed for nausea or vomiting. (Patient not taking: Reported on 01/08/2019) 30 tablet 4   No current facility-administered medications for this visit.    Facility-Administered Medications Ordered in Other Visits  Medication Dose Route Frequency Provider Last Rate Last Dose  . dexamethasone (DECADRON) injection 10 mg  10 mg Intravenous Once Charlaine Dalton R, MD      . dextrose 5 % solution   Intravenous Once Charlaine Dalton R, MD      . fluorouracil (ADRUCIL) 6,450 mg in sodium chloride 0.9 % 121 mL chemo infusion  2,400 mg/m2 (Treatment Plan Recorded) Intravenous 1 day or 1 dose Charlaine Dalton R, MD      . heparin lock flush 100 unit/mL  500 Units Intravenous Once Charlaine Dalton R, MD      . leucovorin 1,000 mg in dextrose 5 % 250 mL infusion  373 mg/m2 (Treatment Plan Recorded) Intravenous Once Cammie Sickle, MD      . oxaliplatin (ELOXATIN) 160 mg in dextrose 5 % 500 mL chemo infusion  60 mg/m2 (Treatment Plan Recorded) Intravenous Once Charlaine Dalton R, MD      . palonosetron (ALOXI) injection 0.25 mg  0.25 mg Intravenous Once Cammie Sickle, MD          .  PHYSICAL EXAMINATION: ECOG PERFORMANCE STATUS: 1 - Symptomatic but completely ambulatory  Vitals:   01/23/19 0820  BP: 114/74  Pulse: 74  Resp: 16  Temp: 97.8 F (36.6 C)   Filed Weights   01/23/19 0820  Weight: (!) 300 lb 9.6 oz (136.4 kg)    Physical Exam  Constitutional: He is oriented to person, place, and time and well-developed, well-nourished, and in no distress.  HENT:  Head: Normocephalic and atraumatic.  Mouth/Throat: Oropharynx is clear and moist. No oropharyngeal exudate.  Eyes: Pupils are equal, round, and reactive to light.  Neck: Normal range of motion. Neck supple.  Cardiovascular: Normal rate and regular rhythm.  Pulmonary/Chest: Effort normal and breath sounds normal. No  respiratory distress. He has no wheezes.  Abdominal: Soft. Bowel sounds are normal. He exhibits no distension and no mass. There is no abdominal tenderness. There is no rebound and no guarding.  Musculoskeletal: Normal range of motion.        General: No tenderness or edema.  Neurological: He is alert and oriented to person, place, and time.  Skin: Skin is warm.  Psychiatric: Affect normal.     LABORATORY DATA:  I have reviewed the data as listed Lab Results  Component Value Date   WBC 5.6 01/23/2019  HGB 12.6 (L) 01/23/2019   HCT 37.1 (L) 01/23/2019   MCV 92.5 01/23/2019   PLT 125 (L) 01/23/2019   Recent Labs    12/26/18 0805 01/09/19 0825 01/23/19 0805  NA 135 138 138  K 3.9 3.7 3.8  CL 106 108 108  CO2 _0 GLUCOSE 205* 255* 176*  BUN _1 CREATININE 0.80 0.83 0.73  CALCIUM 8.7* 8.5* 8.6*  GFRNONAA >60 >60 >60  GFRAA >60 >60 >60  PROT 6.9 6.5 6.8  ALBUMIN 3.7 3.4* 3.5  AST 35 34 33  ALT 32 31 28  ALKPHOS 120 134* 123  BILITOT 1.5* 1.1 1.3*    RADIOGRAPHIC STUDIES: I have personally reviewed the radiological images as listed and agreed with the findings in the report. No results found.  ASSESSMENT & PLAN:   Malignant neoplasm of lower third of esophagus ([place]) #Adenocarcinoma [poorly differentiated/question signet ring morphology] lower third of esophagus/GE junction. Stage IV.  Currently on FOLFOX.  November 27, 2018-PET scan-significant partial response. STABLE.   # proceed with cycle # 12 of FOLFOX today. Labs today reviewed;  acceptable for treatment today. Will plan to get repeat imaging in mid-dec; will order Imaging at next visit.   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   # PN-1- cold induced- sec to Oxaliplatin. STABLE.   # mucositis- G-1; salt/baking soda rinses.   # DISPOSITION: # Treatment today; pump off in 2 days.  # follow up in 2 weeks- MD; labs- cbc/cmp- FOLFOX; pump of in 2 days. -  Dr.B   All questions were answered. The patient knows to call the clinic with any problems, questions or concerns.    Cammie Sickle, MD 01/23/2019 9:44 AM

## 2019-01-23 NOTE — Assessment & Plan Note (Addendum)
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  Currently on FOLFOX.  November 27, 2018-PET scan-significant partial response. STABLE.   # proceed with cycle # 12 of FOLFOX today. Labs today reviewed;  acceptable for treatment today. Will plan to get repeat imaging in mid-dec; will order Imaging at next visit.   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   # PN-1- cold induced- sec to Oxaliplatin. STABLE.   # mucositis- G-1; salt/baking soda rinses.   # DISPOSITION: # Treatment today; pump off in 2 days.  # follow up in 2 weeks- MD; labs- cbc/cmp- FOLFOX; pump of in 2 days. - Dr.B

## 2019-01-25 ENCOUNTER — Inpatient Hospital Stay: Payer: No Typology Code available for payment source

## 2019-01-25 ENCOUNTER — Other Ambulatory Visit: Payer: Self-pay

## 2019-01-25 DIAGNOSIS — Z5111 Encounter for antineoplastic chemotherapy: Secondary | ICD-10-CM | POA: Diagnosis not present

## 2019-01-25 DIAGNOSIS — Z7189 Other specified counseling: Secondary | ICD-10-CM

## 2019-01-25 DIAGNOSIS — C155 Malignant neoplasm of lower third of esophagus: Secondary | ICD-10-CM

## 2019-01-25 MED ORDER — SODIUM CHLORIDE 0.9% FLUSH
10.0000 mL | INTRAVENOUS | Status: DC | PRN
  Administered 2019-01-25: 14:00:00 10 mL
  Filled 2019-01-25: qty 10

## 2019-01-25 MED ORDER — HEPARIN SOD (PORK) LOCK FLUSH 100 UNIT/ML IV SOLN
500.0000 [IU] | Freq: Once | INTRAVENOUS | Status: AC | PRN
  Administered 2019-01-25: 500 [IU]

## 2019-01-25 NOTE — Progress Notes (Signed)
Nutrition Follow-up:  Patient with metastatic GE junction adenocarcinoma.  Patient has completed radiation.  Receiving folfox chemotherapy.   Spoke with patient via phone this am.  Patient reports that he is doing about the same.  Still eating what he wants.  Has not had any more issues with food getting stuck.  Reports that soreness in mouth is really no big deal.  It is not effecting ability to eat.   Reports that he is swamped with work and can't seem to get caught up.    Medications: reviewed  Labs: glucose 176  Anthropometrics:   Weight 300 lb 9.6 oz on 11/17, wt stable 299 lb on 10/8   NUTRITION DIAGNOSIS: Inadequate oral intake stable   INTERVENTION:  Encouraged patient to continue to eat well balanced meals including good sources of protein.   Patient will chew foods well, take time to eat to prevent blockage of foods.  Patient has contact information    MONITORING, EVALUATION, GOAL: Patient will consume adequate calories and protein to maintain lean muscle mass   NEXT VISIT: phone visit, Thursday, Jan 7  Nakya Weyand B. Zenia Resides, [place], [place] Registered Dietitian [phone] (pager)

## 2019-02-06 ENCOUNTER — Inpatient Hospital Stay (HOSPITAL_BASED_OUTPATIENT_CLINIC_OR_DEPARTMENT_OTHER): Payer: No Typology Code available for payment source | Admitting: Internal Medicine

## 2019-02-06 ENCOUNTER — Other Ambulatory Visit: Payer: Self-pay

## 2019-02-06 ENCOUNTER — Inpatient Hospital Stay: Payer: No Typology Code available for payment source | Attending: Internal Medicine

## 2019-02-06 ENCOUNTER — Inpatient Hospital Stay: Payer: No Typology Code available for payment source

## 2019-02-06 VITALS — BP 101/62 | HR 73 | Temp 97.6°F | Resp 20 | Ht 71.0 in | Wt 298.0 lb

## 2019-02-06 DIAGNOSIS — Z5111 Encounter for antineoplastic chemotherapy: Secondary | ICD-10-CM | POA: Insufficient documentation

## 2019-02-06 DIAGNOSIS — K1230 Oral mucositis (ulcerative), unspecified: Secondary | ICD-10-CM | POA: Diagnosis not present

## 2019-02-06 DIAGNOSIS — K7460 Unspecified cirrhosis of liver: Secondary | ICD-10-CM | POA: Insufficient documentation

## 2019-02-06 DIAGNOSIS — R5381 Other malaise: Secondary | ICD-10-CM | POA: Insufficient documentation

## 2019-02-06 DIAGNOSIS — Z7189 Other specified counseling: Secondary | ICD-10-CM

## 2019-02-06 DIAGNOSIS — N23 Unspecified renal colic: Secondary | ICD-10-CM | POA: Insufficient documentation

## 2019-02-06 DIAGNOSIS — E7800 Pure hypercholesterolemia, unspecified: Secondary | ICD-10-CM | POA: Insufficient documentation

## 2019-02-06 DIAGNOSIS — C772 Secondary and unspecified malignant neoplasm of intra-abdominal lymph nodes: Secondary | ICD-10-CM | POA: Insufficient documentation

## 2019-02-06 DIAGNOSIS — I10 Essential (primary) hypertension: Secondary | ICD-10-CM | POA: Diagnosis not present

## 2019-02-06 DIAGNOSIS — Z79899 Other long term (current) drug therapy: Secondary | ICD-10-CM | POA: Diagnosis not present

## 2019-02-06 DIAGNOSIS — E119 Type 2 diabetes mellitus without complications: Secondary | ICD-10-CM | POA: Insufficient documentation

## 2019-02-06 DIAGNOSIS — D696 Thrombocytopenia, unspecified: Secondary | ICD-10-CM | POA: Diagnosis not present

## 2019-02-06 DIAGNOSIS — N200 Calculus of kidney: Secondary | ICD-10-CM | POA: Diagnosis not present

## 2019-02-06 DIAGNOSIS — R5383 Other fatigue: Secondary | ICD-10-CM | POA: Diagnosis not present

## 2019-02-06 DIAGNOSIS — G4730 Sleep apnea, unspecified: Secondary | ICD-10-CM | POA: Insufficient documentation

## 2019-02-06 DIAGNOSIS — B1920 Unspecified viral hepatitis C without hepatic coma: Secondary | ICD-10-CM | POA: Insufficient documentation

## 2019-02-06 DIAGNOSIS — K5900 Constipation, unspecified: Secondary | ICD-10-CM | POA: Diagnosis not present

## 2019-02-06 DIAGNOSIS — C155 Malignant neoplasm of lower third of esophagus: Secondary | ICD-10-CM | POA: Diagnosis not present

## 2019-02-06 DIAGNOSIS — Z95828 Presence of other vascular implants and grafts: Secondary | ICD-10-CM

## 2019-02-06 DIAGNOSIS — Z7952 Long term (current) use of systemic steroids: Secondary | ICD-10-CM | POA: Diagnosis not present

## 2019-02-06 LAB — CBC WITH DIFFERENTIAL/PLATELET
Abs Immature Granulocytes: 0.01 10*3/uL (ref 0.00–0.07)
Basophils Absolute: 0 10*3/uL (ref 0.0–0.1)
Basophils Relative: 0 %
Eosinophils Absolute: 0.3 10*3/uL (ref 0.0–0.5)
Eosinophils Relative: 5 %
HCT: 37 % — ABNORMAL LOW (ref 39.0–52.0)
Hemoglobin: 12.4 g/dL — ABNORMAL LOW (ref 13.0–17.0)
Immature Granulocytes: 0 %
Lymphocytes Relative: 29 %
Lymphs Abs: 1.4 10*3/uL (ref 0.7–4.0)
MCH: 31.9 pg (ref 26.0–34.0)
MCHC: 33.5 g/dL (ref 30.0–36.0)
MCV: 95.1 fL (ref 80.0–100.0)
Monocytes Absolute: 1 10*3/uL (ref 0.1–1.0)
Monocytes Relative: 20 %
Neutro Abs: 2.2 10*3/uL (ref 1.7–7.7)
Neutrophils Relative %: 46 %
Platelets: 102 10*3/uL — ABNORMAL LOW (ref 150–400)
RBC: 3.89 MIL/uL — ABNORMAL LOW (ref 4.22–5.81)
RDW: 13.7 % (ref 11.5–15.5)
WBC: 4.9 10*3/uL (ref 4.0–10.5)
nRBC: 0 % (ref 0.0–0.2)

## 2019-02-06 LAB — COMPREHENSIVE METABOLIC PANEL
ALT: 23 U/L (ref 0–44)
AST: 27 U/L (ref 15–41)
Albumin: 3.5 g/dL (ref 3.5–5.0)
Alkaline Phosphatase: 123 U/L (ref 38–126)
Anion gap: 7 (ref 5–15)
BUN: 16 mg/dL (ref 6–20)
CO2: 23 mmol/L (ref 22–32)
Calcium: 8.6 mg/dL — ABNORMAL LOW (ref 8.9–10.3)
Chloride: 108 mmol/L (ref 98–111)
Creatinine, Ser: 0.73 mg/dL (ref 0.61–1.24)
GFR calc Af Amer: >60 mL/min (ref >60)
GFR calc non Af Amer: >60 mL/min (ref >60)
Glucose, Bld: 197 mg/dL — ABNORMAL HIGH (ref 70–99)
Potassium: 3.9 mmol/L (ref 3.5–5.1)
Sodium: 138 mmol/L (ref 135–145)
Total Bilirubin: 1.3 mg/dL — ABNORMAL HIGH (ref 0.3–1.2)
Total Protein: 6.8 g/dL (ref 6.5–8.1)

## 2019-02-06 MED ORDER — DEXAMETHASONE SODIUM PHOSPHATE 10 MG/ML IJ SOLN
10.0000 mg | Freq: Once | INTRAMUSCULAR | Status: AC
  Administered 2019-02-06: 10 mg via INTRAVENOUS
  Filled 2019-02-06: qty 1

## 2019-02-06 MED ORDER — SODIUM CHLORIDE 0.9 % IV SOLN
2400.0000 mg/m2 | INTRAVENOUS | Status: DC
  Administered 2019-02-06: 6450 mg via INTRAVENOUS
  Filled 2019-02-06: qty 100

## 2019-02-06 MED ORDER — SODIUM CHLORIDE 0.9% FLUSH
10.0000 mL | INTRAVENOUS | Status: DC | PRN
  Administered 2019-02-06: 10 mL
  Filled 2019-02-06: qty 10

## 2019-02-06 MED ORDER — OXALIPLATIN CHEMO INJECTION 100 MG/20ML
60.0000 mg/m2 | Freq: Once | INTRAVENOUS | Status: AC
  Administered 2019-02-06: 160 mg via INTRAVENOUS
  Filled 2019-02-06: qty 32

## 2019-02-06 MED ORDER — PALONOSETRON HCL INJECTION 0.25 MG/5ML
0.2500 mg | Freq: Once | INTRAVENOUS | Status: AC
  Administered 2019-02-06: 0.25 mg via INTRAVENOUS
  Filled 2019-02-06: qty 5

## 2019-02-06 MED ORDER — SODIUM CHLORIDE 0.9% FLUSH
10.0000 mL | Freq: Once | INTRAVENOUS | Status: AC
  Administered 2019-02-06: 10 mL via INTRAVENOUS
  Filled 2019-02-06: qty 10

## 2019-02-06 MED ORDER — LEUCOVORIN CALCIUM INJECTION 350 MG
1000.0000 mg | Freq: Once | INTRAVENOUS | Status: AC
  Administered 2019-02-06: 1000 mg via INTRAVENOUS
  Filled 2019-02-06: qty 50

## 2019-02-06 MED ORDER — DEXTROSE 5 % IV SOLN
Freq: Once | INTRAVENOUS | Status: AC
  Administered 2019-02-06: 09:00:00 via INTRAVENOUS
  Filled 2019-02-06: qty 250

## 2019-02-06 NOTE — Progress Notes (Signed)
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  # June rd week- 5FU pump every 2 days; +RT [until aug 4th]  # 10/16/2018-FOLFOX #1; 9/21-PET- PR. 11/28/2018- cycle #6-decreased the ox dose to 60 mg/2 [sec to platelets- 94].   # DM- diet controlled; Hepatitis C; September 2020-right kidney stone/spontaneous; foreign body impaction/spontaneous  # NGS/omniseq- PDL-1 CPS 20%;EGFR-copy number gain; NEG- targets/ Her-2 neu.   DIAGNOSIS: Adenocarcinoma esophagus/GE junction  STAGE: IV     ;GOALS: pallaitive  CURRENT/MOST RECENT THERAPY ; FOLFOX every 2 weeks      Malignant neoplasm of lower third of esophagus ([place])  08/30/2018 -  Chemotherapy   The patient had palonosetron (ALOXI) injection 0.25 mg, 0.25 mg, Intravenous,  Once, 9 of 10 cycles Administration: 0.25 mg (10/30/2018), 0.25 mg (10/16/2018), 0.25 mg (11/28/2018), 0.25 mg (12/12/2018), 0.25 mg (11/14/2018), 0.25 mg (12/26/2018), 0.25 mg (01/09/2019), 0.25 mg (01/23/2019) leucovorin 1,072 mg in dextrose 5 % 250 mL infusion, 400 mg/m2 = 1,072 mg, Intravenous,  Once, 11 of 12 cycles Administration:  1,000 mg (10/30/2018), 1,000 mg (10/16/2018), 1,000 mg (11/28/2018), 1,000 mg (12/12/2018), 1,000 mg (12/26/2018), 1,000 mg (01/09/2019), 1,000 mg (01/23/2019) oxaliplatin (ELOXATIN) 230 mg in dextrose 5 % 500 mL chemo infusion, 85 mg/m2 = 230 mg, Intravenous,  Once, 9 of 10 cycles Dose modification: 60 mg/m2 (original dose 85 mg/m2, Cycle 7, Reason: Provider Judgment) Administration: 230 mg (10/30/2018), 230 mg (10/16/2018), 160 mg (11/28/2018), 160 mg (12/12/2018), 230 mg (11/14/2018), 160 mg (12/26/2018), 160 mg (01/09/2019), 160 mg (01/23/2019) leucovorin injection 54 mg, 20 mg/m2 = 54 mg (100 % of original dose 20 mg/m2), Intravenous,  Once, 8 of 8 cycles Dose modification: 20 mg/m2 (original dose 20 mg/m2, Cycle 2, Reason: Other (see comments), Comment: iv push), 20 mg/m2 (original dose 20 mg/m2, Cycle 3, Reason: Other (see comments), Comment: ivp) Administration: 54 mg (09/13/2018), 54 mg (10/02/2018) fluorouracil (ADRUCIL) 6,450 mg in sodium chloride 0.9 % 121 mL chemo infusion, 2,400 mg/m2 = 6,450 mg, Intravenous, 1 Day/Dose, 12 of 13 cycles Administration: 6,450 mg (08/30/2018), 6,450 mg (09/13/2018), 6,450 mg (10/30/2018), 6,450 mg (10/02/2018), 6,450 mg (10/16/2018), 6,450 mg (11/28/2018), 6,450 mg (12/12/2018), 6,450 mg (11/14/2018), 6,450 mg (12/26/2018), 6,450 mg (01/09/2019), 6,450 mg (01/23/2019)  for chemotherapy treatment.      HISTORY OF PRESENTING ILLNESS:  Matthew Yang 58 y.o.  male stage IV adenocarcinoma esophagus currently on FOLFOX chemotherapy is here.  Patient had episode of left flank pain approximately 3 days ago.  Lasted for an hour.  Resolved with drinking water.  Denies any nausea vomiting.  Continues to have mild tingling and numbness in extremities.  No difficulty swallowing.   Review of Systems  Constitutional: Positive for malaise/fatigue. Negative for chills, diaphoresis and fever.  HENT: Negative for nosebleeds and sore  throat.   Eyes: Negative for double vision.  Respiratory:  Negative for cough, hemoptysis, sputum production, shortness of breath and wheezing.   Cardiovascular: Negative for chest pain, palpitations, orthopnea and leg swelling.  Gastrointestinal: Negative for abdominal pain, blood in stool, constipation, diarrhea, heartburn, melena and nausea.  Genitourinary: Negative for dysuria, frequency and urgency.  Musculoskeletal: Negative for back pain and joint pain.  Skin: Negative.  Negative for itching and rash.  Neurological: Positive for tingling. Negative for dizziness, focal weakness, weakness and headaches.  Endo/Heme/Allergies: Does not bruise/bleed easily.  Psychiatric/Behavioral: Negative for depression. The patient is not nervous/anxious and does not have insomnia.      MEDICAL HISTORY:  Past Medical History:  Diagnosis Date  . Diabetes mellitus without complication ([place])   . Esophagus cancer ([place])   . Hepatitis    HEPATITIS C  . Hypercholesteremia   . Hypertension   . Morbid obesity ([place])   . Positive hepatitis C antibody test   . Sleep apnea   . Tubular adenoma of colon    polyp    SURGICAL HISTORY: Past Surgical History:  Procedure Laterality Date  . CHOLECYSTECTOMY    . COLONOSCOPY    . COLONOSCOPY WITH PROPOFOL N/A 05/04/2016   Procedure: COLONOSCOPY WITH PROPOFOL;  Surgeon: Lollie Sails, MD;  Location[facility] ENDOSCOPY;  Service: Endoscopy;  Laterality: N/A;  . ESOPHAGOGASTRODUODENOSCOPY (EGD) WITH PROPOFOL N/A 08/04/2018   Procedure: ESOPHAGOGASTRODUODENOSCOPY (EGD) WITH PROPOFOL;  Surgeon: Lollie Sails, MD;  Location: [facility] ENDOSCOPY;  Service: Endoscopy;  Laterality: N/A;  . PORTA CATH INSERTION N/A 08/17/2018   Procedure: PORTA CATH INSERTION;  Surgeon: Algernon Huxley, MD;  Location: [facility] LAB;  Service: Cardiovascular;  Laterality: N/A;    SOCIAL HISTORY: Social History   Socioeconomic History  . Marital status: Married    Spouse name: Not on file  . Number of children: Not on file  . Years of  education: Not on file  . Highest education level: Not on file  Occupational History  . Not on file  Social Needs  . Financial resource strain: Not on file  . Food insecurity    Worry: Not on file    Inability: Not on file  . Transportation needs    Medical: Not on file    Non-medical: Not on file  Tobacco Use  . Smoking status: Never Smoker  . Smokeless tobacco: Never Used  Substance and Sexual Activity  . Alcohol use: Not Currently  . Drug use: No  . Sexual activity: Not on file  Lifestyle  . Physical activity    Days per week: Not on file    Minutes per session: Not on file  . Stress: Not on file  Relationships  . Social Herbalist on phone: Not on file    Gets together: Not on file    Attends religious service: Not on file    Active member of club or organization: Not on file    Attends meetings of clubs or organizations: Not on file    Relationship status: Not on file  . Intimate partner violence    Fear of current or ex partner: Not on file    Emotionally abused: Not on file    Physically abused: Not on file    Forced sexual activity: Not on file  Other Topics Concern  . 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  FAMILY HISTORY: Family History  Family history unknown: Yes    ALLERGIES:  has No Known Allergies.  MEDICATIONS:  Current Outpatient Medications  Medication Sig Dispense Refill  . atorvastatin (LIPITOR) 20 MG tablet Take 20 mg by mouth daily.    Marland Kitchen lidocaine-prilocaine (EMLA) cream Apply 1 application topically as needed. 30 g 4  . lisinopril (PRINIVIL,ZESTRIL) 20 MG tablet Take 20 mg by mouth daily.    . Multiple Vitamin (MULTIVITAMIN WITH MINERALS) TABS tablet Take 1 tablet by mouth daily.    . Omega-3 Fatty Acids (FISH OIL) 1000 MG CAPS Take 1,000 mg by mouth daily.     . ondansetron (ZOFRAN) 8 MG tablet Take 1 tablet (8 mg total) by mouth every 8 (eight)  hours as needed for nausea or vomiting. 20 tablet 4  . pantoprazole (PROTONIX) 40 MG tablet Take 40 mg by mouth daily.    . prochlorperazine (COMPAZINE) 10 MG tablet Take 1 tablet (10 mg total) by mouth every 6 (six) hours as needed for nausea or vomiting. 30 tablet 4  . Turmeric 500 MG CAPS Take 500 mg by mouth daily.     No current facility-administered medications for this visit.    Facility-Administered Medications Ordered in Other Visits  Medication Dose Route Frequency Provider Last Rate Last Dose  . dexamethasone (DECADRON) injection 10 mg  10 mg Intravenous Once Charlaine Dalton R, MD      . fluorouracil (ADRUCIL) 6,450 mg in sodium chloride 0.9 % 121 mL chemo infusion  2,400 mg/m2 (Treatment Plan Recorded) Intravenous 1 day or 1 dose Charlaine Dalton R, MD      . leucovorin 1,000 mg in dextrose 5 % 250 mL infusion  1,000 mg Intravenous Once Charlaine Dalton R, MD      . oxaliplatin (ELOXATIN) 160 mg in dextrose 5 % 500 mL chemo infusion  60 mg/m2 (Treatment Plan Recorded) Intravenous Once Charlaine Dalton R, MD      . palonosetron (ALOXI) injection 0.25 mg  0.25 mg Intravenous Once Cammie Sickle, MD          .  PHYSICAL EXAMINATION: ECOG PERFORMANCE STATUS: 1 - Symptomatic but completely ambulatory  Vitals:   02/06/19 0824  BP: 101/62  Pulse: 73  Resp: 20  Temp: 97.6 F (36.4 C)   Filed Weights   02/06/19 0824  Weight: 298 lb (135.2 kg)    Physical Exam  Constitutional: He is oriented to person, place, and time and well-developed, well-nourished, and in no distress.  HENT:  Head: Normocephalic and atraumatic.  Mouth/Throat: Oropharynx is clear and moist. No oropharyngeal exudate.  Eyes: Pupils are equal, round, and reactive to light.  Neck: Normal range of motion. Neck supple.  Cardiovascular: Normal rate and regular rhythm.  Pulmonary/Chest: Effort normal and breath sounds normal. No respiratory distress. He has no wheezes.  Abdominal: Soft.  Bowel sounds are normal. He exhibits no distension and no mass. There is no abdominal tenderness. There is no rebound and no guarding.  Musculoskeletal: Normal range of motion.        General: No tenderness or edema.  Neurological: He is alert and oriented to person, place, and time.  Skin: Skin is warm.  Psychiatric: Affect normal.     LABORATORY DATA:  I have reviewed the data as listed Lab Results  Component Value Date   WBC 4.9 02/06/2019   HGB 12.4 (L) 02/06/2019   HCT 37.0 (L) 02/06/2019   MCV 95.1 02/06/2019   PLT 102 (L) 02/06/2019  Recent Labs    01/09/19 0825 01/23/19 0805 02/06/19 0820  NA 138 138 138  K 3.7 3.8 3.9  CL 108 108 108  CO2 _0 GLUCOSE 255* 176* 197*  BUN _1 CREATININE 0.83 0.73 0.73  CALCIUM 8.5* 8.6* 8.6*  GFRNONAA >60 >60 >60  GFRAA >60 >60 >60  PROT 6.5 6.8 6.8  ALBUMIN 3.4* 3.5 3.5  AST 34 33 27  ALT _2 ALKPHOS 134* 123 123  BILITOT 1.1 1.3* 1.3*    RADIOGRAPHIC STUDIES: I have personally reviewed the radiological images as listed and agreed with the findings in the report. No results found.  ASSESSMENT & PLAN:   Malignant neoplasm of lower third of esophagus ([place]) #Adenocarcinoma [poorly differentiated/question signet ring morphology] lower third of esophagus/GE junction. Stage IV.  Currently on FOLFOX.  November 27, 2018-PET scan-significant partial response. Stable.   # proceed with cycle # 12 of FOLFOX today. Labs today reviewed;  acceptable for treatment today- platelets 102.  Will order imaging today.   # Left renal colic- sec to stone- recommend referral to Urology.   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   # PN-1- cold induced- sec to Oxaliplatin. Stable   # mucositis- G-1; salt/baking soda rinses.   # DISPOSITION: # refer to Urology- re: kidney stones # Treatment today; pump off in 2 days.  # follow up in 2 weeks- MD; labs- cbc/cmp- FOLFOX; pump of  in 2 days. - CT C/A/P prior-Dr.B   All questions were answered. The patient knows to call the clinic with any problems, questions or concerns.    Cammie Sickle, MD 02/06/2019 9:25 AM

## 2019-02-06 NOTE — Assessment & Plan Note (Addendum)
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  Currently on FOLFOX.  November 27, 2018-PET scan-significant partial response. Stable.   # proceed with cycle # 12 of FOLFOX today. Labs today reviewed;  acceptable for treatment today- platelets 102.  Will order imaging today.   # Left renal colic- sec to stone- recommend referral to Urology.   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   # PN-1- cold induced- sec to Oxaliplatin. Stable   # mucositis- G-1; salt/baking soda rinses.   # DISPOSITION: # refer to Urology- re: kidney stones # Treatment today; pump off in 2 days.  # follow up in 2 weeks- MD; labs- cbc/cmp- FOLFOX; pump of in 2 days. - CT C/A/P prior-Dr.B

## 2019-02-08 ENCOUNTER — Other Ambulatory Visit: Payer: Self-pay

## 2019-02-08 ENCOUNTER — Inpatient Hospital Stay: Payer: No Typology Code available for payment source

## 2019-02-08 DIAGNOSIS — Z5111 Encounter for antineoplastic chemotherapy: Secondary | ICD-10-CM | POA: Diagnosis not present

## 2019-02-08 DIAGNOSIS — Z7189 Other specified counseling: Secondary | ICD-10-CM

## 2019-02-08 DIAGNOSIS — C155 Malignant neoplasm of lower third of esophagus: Secondary | ICD-10-CM

## 2019-02-08 MED ORDER — SODIUM CHLORIDE 0.9% FLUSH
10.0000 mL | INTRAVENOUS | Status: DC | PRN
  Administered 2019-02-08: 10 mL
  Filled 2019-02-08: qty 10

## 2019-02-08 MED ORDER — HEPARIN SOD (PORK) LOCK FLUSH 100 UNIT/ML IV SOLN
INTRAVENOUS | Status: AC
  Filled 2019-02-08: qty 5

## 2019-02-08 MED ORDER — HEPARIN SOD (PORK) LOCK FLUSH 100 UNIT/ML IV SOLN
500.0000 [IU] | Freq: Once | INTRAVENOUS | Status: AC | PRN
  Administered 2019-02-08: 500 [IU]

## 2019-02-19 ENCOUNTER — Ambulatory Visit
Admission: RE | Admit: 2019-02-19 | Discharge: 2019-02-19 | Disposition: A | Discharge status: Home / Self Care | Payer: No Typology Code available for payment source | Source: Ambulatory Visit | Attending: Internal Medicine | Admitting: Internal Medicine

## 2019-02-19 ENCOUNTER — Other Ambulatory Visit: Payer: Self-pay

## 2019-02-19 ENCOUNTER — Encounter: Payer: Self-pay | Admitting: Internal Medicine

## 2019-02-19 DIAGNOSIS — C155 Malignant neoplasm of lower third of esophagus: Secondary | ICD-10-CM | POA: Diagnosis not present

## 2019-02-19 MED ORDER — IOHEXOL 300 MG/ML  SOLN
100.0000 mL | Freq: Once | INTRAMUSCULAR | Status: AC | PRN
  Administered 2019-02-19: 100 mL via INTRAVENOUS

## 2019-02-19 NOTE — Progress Notes (Signed)
Patient is here for a follow-up to discuss his CT results with no complaints or concerns.

## 2019-02-20 ENCOUNTER — Inpatient Hospital Stay: Payer: No Typology Code available for payment source

## 2019-02-20 ENCOUNTER — Inpatient Hospital Stay (HOSPITAL_BASED_OUTPATIENT_CLINIC_OR_DEPARTMENT_OTHER): Payer: No Typology Code available for payment source | Admitting: Internal Medicine

## 2019-02-20 ENCOUNTER — Other Ambulatory Visit: Payer: Self-pay

## 2019-02-20 DIAGNOSIS — Z5111 Encounter for antineoplastic chemotherapy: Secondary | ICD-10-CM | POA: Diagnosis not present

## 2019-02-20 DIAGNOSIS — C155 Malignant neoplasm of lower third of esophagus: Secondary | ICD-10-CM | POA: Diagnosis not present

## 2019-02-20 DIAGNOSIS — Z7189 Other specified counseling: Secondary | ICD-10-CM

## 2019-02-20 DIAGNOSIS — Z95828 Presence of other vascular implants and grafts: Secondary | ICD-10-CM

## 2019-02-20 LAB — CBC WITH DIFFERENTIAL/PLATELET
Abs Immature Granulocytes: 0.01 10*3/uL (ref 0.00–0.07)
Basophils Absolute: 0 10*3/uL (ref 0.0–0.1)
Basophils Relative: 0 %
Eosinophils Absolute: 0.3 10*3/uL (ref 0.0–0.5)
Eosinophils Relative: 6 %
HCT: 37.4 % — ABNORMAL LOW (ref 39.0–52.0)
Hemoglobin: 12.5 g/dL — ABNORMAL LOW (ref 13.0–17.0)
Immature Granulocytes: 0 %
Lymphocytes Relative: 25 %
Lymphs Abs: 1.3 10*3/uL (ref 0.7–4.0)
MCH: 31.6 pg (ref 26.0–34.0)
MCHC: 33.4 g/dL (ref 30.0–36.0)
MCV: 94.4 fL (ref 80.0–100.0)
Monocytes Absolute: 1 10*3/uL (ref 0.1–1.0)
Monocytes Relative: 20 %
Neutro Abs: 2.5 10*3/uL (ref 1.7–7.7)
Neutrophils Relative %: 49 %
Platelets: 96 10*3/uL — ABNORMAL LOW (ref 150–400)
RBC: 3.96 MIL/uL — ABNORMAL LOW (ref 4.22–5.81)
RDW: 13.3 % (ref 11.5–15.5)
WBC: 5.1 10*3/uL (ref 4.0–10.5)
nRBC: 0 % (ref 0.0–0.2)

## 2019-02-20 LAB — COMPREHENSIVE METABOLIC PANEL
ALT: 23 U/L (ref 0–44)
AST: 28 U/L (ref 15–41)
Albumin: 3.5 g/dL (ref 3.5–5.0)
Alkaline Phosphatase: 123 U/L (ref 38–126)
Anion gap: 10 (ref 5–15)
BUN: 13 mg/dL (ref 6–20)
CO2: 22 mmol/L (ref 22–32)
Calcium: 8.7 mg/dL — ABNORMAL LOW (ref 8.9–10.3)
Chloride: 106 mmol/L (ref 98–111)
Creatinine, Ser: 0.67 mg/dL (ref 0.61–1.24)
GFR calc Af Amer: >60 mL/min (ref >60)
GFR calc non Af Amer: >60 mL/min (ref >60)
Glucose, Bld: 161 mg/dL — ABNORMAL HIGH (ref 70–99)
Potassium: 3.9 mmol/L (ref 3.5–5.1)
Sodium: 138 mmol/L (ref 135–145)
Total Bilirubin: 1.2 mg/dL (ref 0.3–1.2)
Total Protein: 6.7 g/dL (ref 6.5–8.1)

## 2019-02-20 MED ORDER — SODIUM CHLORIDE 0.9 % IV SOLN
INTRAVENOUS | Status: DC
  Filled 2019-02-20: qty 250

## 2019-02-20 MED ORDER — DEXAMETHASONE SODIUM PHOSPHATE 10 MG/ML IJ SOLN
10.0000 mg | Freq: Once | INTRAMUSCULAR | Status: AC
  Administered 2019-02-20: 10 mg via INTRAVENOUS
  Filled 2019-02-20: qty 1

## 2019-02-20 MED ORDER — SODIUM CHLORIDE 0.9% FLUSH
10.0000 mL | Freq: Once | INTRAVENOUS | Status: AC
  Administered 2019-02-20: 10 mL via INTRAVENOUS
  Filled 2019-02-20: qty 10

## 2019-02-20 MED ORDER — SODIUM CHLORIDE 0.9 % IV SOLN
2400.0000 mg/m2 | INTRAVENOUS | Status: DC
  Administered 2019-02-20: 6450 mg via INTRAVENOUS
  Filled 2019-02-20: qty 129

## 2019-02-20 NOTE — Progress Notes (Signed)
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  # June rd week- 5FU pump every 2 days; +RT [until aug 4th]  # 10/16/2018-FOLFOX #1; 9/21-PET- PR. 11/28/2018- cycle #6-decreased the ox dose to 60 mg/2 [sec to platelets- 94]. S/p 12 cycles of FOLFOX; DEC 15th 2020- switched to 5 CIV ONLY; dced Oxaliplatin.   # DM- diet controlled; Hepatitis C; September 2020-right kidney stone/spontaneous; foreign body impaction/spontaneous  # NGS/omniseq- PDL-1 CPS 20%;EGFR-copy number gain; NEG- targets/ Her-2 neu.   DIAGNOSIS: Adenocarcinoma esophagus/GE junction  STAGE: IV     ;GOALS: pallaitive  CURRENT/MOST RECENT THERAPY: 5FU  CIV q 2 weeks      Malignant neoplasm of lower third of esophagus ([place])  08/30/2018 -  Chemotherapy   The patient had palonosetron (ALOXI) injection 0.25 mg, 0.25 mg, Intravenous,  Once, 9 of 9 cycles Administration: 0.25 mg (10/30/2018), 0.25 mg (10/16/2018), 0.25 mg (11/28/2018), 0.25 mg (12/12/2018), 0.25 mg (11/14/2018), 0.25 mg (12/26/2018), 0.25 mg (01/09/2019), 0.25 mg (01/23/2019), 0.25 mg (02/06/2019) leucovorin 1,072 mg in  dextrose 5 % 250 mL infusion, 400 mg/m2 = 1,072 mg, Intravenous,  Once, 11 of 11 cycles Administration: 1,000 mg (10/30/2018), 1,000 mg (10/16/2018), 1,000 mg (11/28/2018), 1,000 mg (12/12/2018), 1,000 mg (12/26/2018), 1,000 mg (01/09/2019), 1,000 mg (01/23/2019), 1,000 mg (02/06/2019) oxaliplatin (ELOXATIN) 230 mg in dextrose 5 % 500 mL chemo infusion, 85 mg/m2 = 230 mg, Intravenous,  Once, 9 of 9 cycles Dose modification: 60 mg/m2 (original dose 85 mg/m2, Cycle 7, Reason: Provider Judgment) Administration: 230 mg (10/30/2018), 230 mg (10/16/2018), 160 mg (11/28/2018), 160 mg (12/12/2018), 230 mg (11/14/2018), 160 mg (12/26/2018), 160 mg (01/09/2019), 160 mg (01/23/2019), 160 mg (02/06/2019) leucovorin injection 54 mg, 20 mg/m2 = 54 mg (100 % of original dose 20 mg/m2), Intravenous,  Once, 8 of 8 cycles Dose modification: 20 mg/m2 (original dose 20 mg/m2, Cycle 2, Reason: Other (see comments), Comment: iv push), 20 mg/m2 (original dose 20 mg/m2, Cycle 3, Reason: Other (see comments), Comment: ivp) Administration: 54 mg (09/13/2018), 54 mg (10/02/2018) fluorouracil (ADRUCIL) 6,450 mg in sodium chloride 0.9 % 121 mL chemo infusion, 2,400 mg/m2 = 6,450 mg, Intravenous, 1 Day/Dose, 13 of 17 cycles Administration: 6,450 mg (08/30/2018), 6,450 mg (09/13/2018), 6,450 mg (10/30/2018), 6,450 mg (10/02/2018), 6,450 mg (10/16/2018), 6,450 mg (11/28/2018), 6,450 mg (12/12/2018), 6,450 mg (11/14/2018), 6,450 mg (12/26/2018), 6,450 mg (01/09/2019), 6,450 mg (01/23/2019), 6,450 mg (02/06/2019), 6,450 mg (02/20/2019)  for chemotherapy treatment.      HISTORY OF PRESENTING ILLNESS:  Matthew Yang 58 y.o.  male stage IV adenocarcinoma esophagus currently on FOLFOX chemotherapy is here/review results of the CT scan.  Patient denies any further episodes of flank pain.  Denies any nausea vomiting.  Continues to have tingling and numbness in extremities-as patient exposure to cold.  No difficulty swallowing.  No significant weight loss.  No nausea  no vomiting.  Review of Systems  Constitutional: Positive for malaise/fatigue. Negative for chills, diaphoresis and fever.  HENT: Negative for nosebleeds and sore throat.   Eyes: Negative for double vision.  Respiratory: Negative for cough, hemoptysis, sputum production, shortness of breath and wheezing.   Cardiovascular: Negative for chest pain, palpitations, orthopnea and leg swelling.  Gastrointestinal: Positive for constipation. Negative for abdominal pain, blood in stool, diarrhea, heartburn, melena and nausea.  Genitourinary: Negative for dysuria, frequency and urgency.  Musculoskeletal: Negative for back pain and joint pain.  Skin: Negative.  Negative for itching and rash.  Neurological: Positive for tingling. Negative for dizziness, focal weakness, weakness and headaches.  Endo/Heme/Allergies: Does not bruise/bleed easily.  Psychiatric/Behavioral: Negative for depression. The patient is not nervous/anxious and does not have insomnia.      MEDICAL HISTORY:  Past Medical History:  Diagnosis Date  . Diabetes mellitus without complication ([place])   . Esophagus cancer ([place])   . Hepatitis    HEPATITIS C  . Hypercholesteremia   . Hypertension   . Morbid obesity ([place])   . Positive hepatitis C antibody test   . Sleep apnea   . Tubular adenoma of colon    polyp    SURGICAL HISTORY: Past Surgical History:  Procedure Laterality Date  . CHOLECYSTECTOMY    . COLONOSCOPY    . COLONOSCOPY WITH PROPOFOL N/A 05/04/2016   Procedure: COLONOSCOPY WITH PROPOFOL;  Surgeon: Lollie Sails, MD;  Location: [facility] ENDOSCOPY;  Service: Endoscopy;  Laterality: N/A;  . ESOPHAGOGASTRODUODENOSCOPY (EGD) WITH PROPOFOL N/A 08/04/2018   Procedure: ESOPHAGOGASTRODUODENOSCOPY (EGD) WITH PROPOFOL;  Surgeon: Lollie Sails, MD;  Location: [facility] ENDOSCOPY;  Service: Endoscopy;  Laterality: N/A;  . PORTA CATH INSERTION N/A 08/17/2018   Procedure: PORTA CATH INSERTION;  Surgeon: Algernon Huxley, MD;  Location:  [facility] LAB;  Service: Cardiovascular;  Laterality: N/A;    SOCIAL HISTORY: Social History   Socioeconomic History  . Marital status: Married    Spouse name: Not on file  . Number of children: Not on file  . Years of education: Not on file  . Highest education level: Not on file  Occupational History  . Not on file  Tobacco Use  . Smoking status: Never Smoker  . Smokeless tobacco: Never Used  Substance and Sexual Activity  . Alcohol use: Not Currently  . Drug use: No  . Sexual activity: Not on file  Other Topics Concern  . 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    Social Determinants of Health   Financial Resource Strain:   . Difficulty of Paying Living Expenses: Not on file  Food Insecurity:   . Worried About Charity fundraiser in the Last Year: Not on file  . Ran Out of Food in the Last Year: Not on file  Transportation Needs:   . Lack of Transportation (Medical): Not on file  . Lack of Transportation (Non-Medical): Not on file  Physical Activity:   . Days of Exercise per Week: Not on file  . Minutes of Exercise per Session: Not on file  Stress:   . Feeling of Stress : Not on file  Social Connections:   . Frequency of Communication with Friends and Family: Not on file  . Frequency of Social Gatherings with Friends and Family: Not on file  . Attends Religious  Services: Not on file  . Active Member of Clubs or Organizations: Not on file  . Attends Archivist Meetings: Not on file  . Marital Status: Not on file  Intimate Partner Violence:   . Fear of Current or Ex-Partner: Not on file  . Emotionally Abused: Not on file  . Physically Abused: Not on file  . Sexually Abused: Not on file    FAMILY HISTORY: Family History  Family history unknown: Yes    ALLERGIES:  has No Known Allergies.  MEDICATIONS:  Current Outpatient Medications  Medication Sig Dispense Refill   . atorvastatin (LIPITOR) 20 MG tablet Take 20 mg by mouth daily.    Marland Kitchen lidocaine-prilocaine (EMLA) cream Apply 1 application topically as needed. 30 g 4  . lisinopril (PRINIVIL,ZESTRIL) 20 MG tablet Take 20 mg by mouth daily.    . Multiple Vitamin (MULTIVITAMIN WITH MINERALS) TABS tablet Take 1 tablet by mouth daily.    . Omega-3 Fatty Acids (FISH OIL) 1000 MG CAPS Take 1,000 mg by mouth daily.     . ondansetron (ZOFRAN) 8 MG tablet Take 1 tablet (8 mg total) by mouth every 8 (eight) hours as needed for nausea or vomiting. 20 tablet 4  . pantoprazole (PROTONIX) 40 MG tablet Take 40 mg by mouth daily.    . prochlorperazine (COMPAZINE) 10 MG tablet Take 1 tablet (10 mg total) by mouth every 6 (six) hours as needed for nausea or vomiting. 30 tablet 4  . Turmeric 500 MG CAPS Take 500 mg by mouth daily.     No current facility-administered medications for this visit.   Facility-Administered Medications Ordered in Other Visits  Medication Dose Route Frequency Provider Last Rate Last Admin  . 0.9 %  sodium chloride infusion   Intravenous Continuous Cammie Sickle, MD   Stopped at 02/20/19 1054  . fluorouracil (ADRUCIL) 6,450 mg in sodium chloride 0.9 % 121 mL chemo infusion  2,400 mg/m2 (Treatment Plan Recorded) Intravenous 1 day or 1 dose Cammie Sickle, MD   6,450 mg at 02/20/19 1032      .  PHYSICAL EXAMINATION: ECOG PERFORMANCE STATUS: 1 - Symptomatic but completely ambulatory  Vitals:   02/20/19 0852  BP: 110/69  Pulse: 73  Temp: (!) 97.4 F (36.3 C)   Filed Weights   02/20/19 0852  Weight: (!) 301 lb 3.2 oz (136.6 kg)    Physical Exam  Constitutional: He is oriented to person, place, and time and well-developed, well-nourished, and in no distress.  HENT:  Head: Normocephalic and atraumatic.  Mouth/Throat: Oropharynx is clear and moist. No oropharyngeal exudate.  Eyes: Pupils are equal, round, and reactive to light.  Cardiovascular: Normal rate and regular  rhythm.  Pulmonary/Chest: Effort normal and breath sounds normal. No respiratory distress. He has no wheezes.  Abdominal: Soft. Bowel sounds are normal. He exhibits no distension and no mass. There is no abdominal tenderness. There is no rebound and no guarding.  Musculoskeletal:        General: No tenderness or edema. Normal range of motion.     Cervical back: Normal range of motion and neck supple.  Neurological: He is alert and oriented to person, place, and time.  Skin: Skin is warm.  Psychiatric: Affect normal.     LABORATORY DATA:  I have reviewed the data as listed Lab Results  Component Value Date   WBC 5.1 02/20/2019   HGB 12.5 (L) 02/20/2019   HCT 37.4 (L) 02/20/2019   MCV 94.4 02/20/2019  PLT 96 (L) 02/20/2019   Recent Labs    01/23/19 0805 02/06/19 0820 02/20/19 0806  NA 138 138 138  K 3.8 3.9 3.9  CL 108 108 106  CO2 '23 23 22  ' GLUCOSE 176* 197* 161*  BUN '12 16 13  ' CREATININE 0.73 0.73 0.67  CALCIUM 8.6* 8.6* 8.7*  GFRNONAA >60 >60 >60  GFRAA >60 >60 >60  PROT 6.8 6.8 6.7  ALBUMIN 3.5 3.5 3.5  AST 33 27 28  ALT '28 23 23  ' ALKPHOS 123 123 123  BILITOT 1.3* 1.3* 1.2    RADIOGRAPHIC STUDIES: I have personally reviewed the radiological images as listed and agreed with the findings in the report. CT Chest W Contrast  Result Date: 02/19/2019 CLINICAL DATA:  Esophageal cancer. EXAM: CT CHEST, ABDOMEN, AND PELVIS WITH CONTRAST TECHNIQUE: Multidetector CT imaging of the chest, abdomen and pelvis was performed following the standard protocol during bolus administration of intravenous contrast. CONTRAST:  173m OMNIPAQUE IOHEXOL 300 MG/ML  SOLN COMPARISON:  PET-CT 11/27/2018.  CT scan from 11/20/2018. FINDINGS: CT CHEST FINDINGS Cardiovascular: The heart size is normal. No substantial pericardial effusion. Coronary artery calcification is evident. Atherosclerotic calcification is noted in the wall of the thoracic aorta. Right Port-A-Cath tip is positioned in the  distal SVC. Mediastinum/Nodes: No mediastinal lymphadenopathy. There is no hilar lymphadenopathy. The esophagus has normal imaging features. There is no axillary lymphadenopathy. Lungs/Pleura: No suspicious pulmonary nodule or mass. Search no consolidation No pleural effusion. Musculoskeletal: No worrisome lytic or sclerotic osseous abnormality. CT ABDOMEN PELVIS FINDINGS Hepatobiliary: Nodular liver contour is compatible with cirrhosis. Gallbladder is surgically absent. No intrahepatic or extrahepatic biliary dilation. Pancreas: No focal mass lesion. No dilatation of the main duct. No intraparenchymal cyst. No peripancreatic edema. Spleen: No splenomegaly. No focal mass lesion. Adrenals/Urinary Tract: No adrenal nodule or mass. No evidence for hydroureter. The urinary bladder appears normal for the degree of distention. Stomach/Bowel: Subtle haziness noted around the distal esophagus and esophagogastric junction without mass lesion evident by CT. Stomach otherwise unremarkable. Duodenum is normally positioned as is the ligament of Treitz. No small bowel wall thickening. No small bowel dilatation. The terminal ileum is normal. The appendix is normal. No gross colonic mass. No colonic wall thickening. Vascular/Lymphatic: There is abdominal aortic atherosclerosis without aneurysm. No upper abdominal lymphadenopathy. Index gastrohepatic ligament lymph node has short axis measurement of 7 mm today compared to 10 mm on PET-CT of 11/27/2018. No pelvic sidewall lymphadenopathy. Reproductive: The prostate gland and seminal vesicles are unremarkable. Other: No intraperitoneal free fluid. Musculoskeletal: No worrisome lytic or sclerotic osseous abnormality. IMPRESSION: 1. Stable exam. No new or progressive interval findings since PET-CT 11/27/2018. 2. No obvious distal esophageal mass on today's study with subtle haziness around the distal esophagus and esophagogastric junction, stable. Index gastrohepatic ligament lymph node  measured on prior study has decreased slightly in the interval. 3. Liver contour compatible with cirrhosis. 4.  Aortic Atherosclerois (ICD10-170.0) Electronically Signed   By: EMisty StanleyM.D.   On: 02/19/2019 16:01   CT ABDOMEN PELVIS W CONTRAST  Result Date: 02/19/2019 CLINICAL DATA:  Esophageal cancer. EXAM: CT CHEST, ABDOMEN, AND PELVIS WITH CONTRAST TECHNIQUE: Multidetector CT imaging of the chest, abdomen and pelvis was performed following the standard protocol during bolus administration of intravenous contrast. CONTRAST:  1029mOMNIPAQUE IOHEXOL 300 MG/ML  SOLN COMPARISON:  PET-CT 11/27/2018.  CT scan from 11/20/2018. FINDINGS: CT CHEST FINDINGS Cardiovascular: The heart size is normal. No substantial pericardial effusion. Coronary artery calcification is evident.  Atherosclerotic calcification is noted in the wall of the thoracic aorta. Right Port-A-Cath tip is positioned in the distal SVC. Mediastinum/Nodes: No mediastinal lymphadenopathy. There is no hilar lymphadenopathy. The esophagus has normal imaging features. There is no axillary lymphadenopathy. Lungs/Pleura: No suspicious pulmonary nodule or mass. Search no consolidation No pleural effusion. Musculoskeletal: No worrisome lytic or sclerotic osseous abnormality. CT ABDOMEN PELVIS FINDINGS Hepatobiliary: Nodular liver contour is compatible with cirrhosis. Gallbladder is surgically absent. No intrahepatic or extrahepatic biliary dilation. Pancreas: No focal mass lesion. No dilatation of the main duct. No intraparenchymal cyst. No peripancreatic edema. Spleen: No splenomegaly. No focal mass lesion. Adrenals/Urinary Tract: No adrenal nodule or mass. No evidence for hydroureter. The urinary bladder appears normal for the degree of distention. Stomach/Bowel: Subtle haziness noted around the distal esophagus and esophagogastric junction without mass lesion evident by CT. Stomach otherwise unremarkable. Duodenum is normally positioned as is the  ligament of Treitz. No small bowel wall thickening. No small bowel dilatation. The terminal ileum is normal. The appendix is normal. No gross colonic mass. No colonic wall thickening. Vascular/Lymphatic: There is abdominal aortic atherosclerosis without aneurysm. No upper abdominal lymphadenopathy. Index gastrohepatic ligament lymph node has short axis measurement of 7 mm today compared to 10 mm on PET-CT of 11/27/2018. No pelvic sidewall lymphadenopathy. Reproductive: The prostate gland and seminal vesicles are unremarkable. Other: No intraperitoneal free fluid. Musculoskeletal: No worrisome lytic or sclerotic osseous abnormality. IMPRESSION: 1. Stable exam. No new or progressive interval findings since PET-CT 11/27/2018. 2. No obvious distal esophageal mass on today's study with subtle haziness around the distal esophagus and esophagogastric junction, stable. Index gastrohepatic ligament lymph node measured on prior study has decreased slightly in the interval. 3. Liver contour compatible with cirrhosis. 4.  Aortic Atherosclerois (ICD10-170.0) Electronically Signed   By: Misty Stanley M.D.   On: 02/19/2019 16:01    ASSESSMENT & PLAN:   Malignant neoplasm of lower third of esophagus ([place]) #Adenocarcinoma [poorly differentiated/question signet ring morphology] lower third of esophagus/GE junction. Stage IV.  Currently on FOLFOX.  DEC 11th 2020-CT scan-STABLE; 7 mm retroperitoneal lymph node improved from 10 mm previous.. Discussed the limitations of CT scan. On FOLFOX s/p 12 cycles. DC-oxaliplatin.  # Proceed with 5-FU CIV; Labs today reviewed;  acceptable for treatment today.   # Left renal colic- sec to stone- ? Passed- improved; apt with Urology- in Jan 2021.   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    # PN-1-2- cold induced- sec to Oxaliplatin. STABLE; will drop Oxaliplatin.   # mucositis- G-1; salt/baking soda rinses.STABLE.  # Constipation- G-1- ?  Anti-emetics- should improve since discontinued; continue miralax qhs. .    # DISPOSITION: # 5FU chemo Only; dced oxaliplatin.  # Treatment today; pump off in 2 days.  # follow up in 2 weeks- MD; labs- cbc/cmp- 5FU IV pump; pump of in 2 days; -Dr.B   All questions were answered. The patient knows to call the clinic with any problems, questions or concerns.    Cammie Sickle, MD 02/20/2019 12:43 PM

## 2019-02-20 NOTE — Assessment & Plan Note (Addendum)
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  Currently on FOLFOX.  DEC 11th 2020-CT scan-STABLE; 7 mm retroperitoneal lymph node improved from 10 mm previous.. Discussed the limitations of CT scan. On FOLFOX s/p 12 cycles. DC-oxaliplatin.  # Proceed with 5-FU CIV; Labs today reviewed;  acceptable for treatment today.   # Left renal colic- sec to stone- ? Passed- improved; apt with Urology- in Jan 2021.   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    # PN-1-2- cold induced- sec to Oxaliplatin. STABLE; will drop Oxaliplatin.   # mucositis- G-1; salt/baking soda rinses.STABLE.  # Constipation- G-1- ? Anti-emetics- should improve since discontinued; continue miralax qhs. .    # DISPOSITION: # 5FU chemo Only; dced oxaliplatin.  # Treatment today; pump off in 2 days.  # follow up in 2 weeks- MD; labs- cbc/cmp- 5FU IV pump; pump of in 2 days; -Dr.B

## 2019-02-22 ENCOUNTER — Inpatient Hospital Stay: Payer: No Typology Code available for payment source

## 2019-02-22 ENCOUNTER — Other Ambulatory Visit: Payer: Self-pay

## 2019-02-22 DIAGNOSIS — Z5111 Encounter for antineoplastic chemotherapy: Secondary | ICD-10-CM | POA: Diagnosis not present

## 2019-02-22 DIAGNOSIS — Z7189 Other specified counseling: Secondary | ICD-10-CM

## 2019-02-22 DIAGNOSIS — C155 Malignant neoplasm of lower third of esophagus: Secondary | ICD-10-CM

## 2019-02-22 MED ORDER — HEPARIN SOD (PORK) LOCK FLUSH 100 UNIT/ML IV SOLN
500.0000 [IU] | Freq: Once | INTRAVENOUS | Status: AC | PRN
  Administered 2019-02-22: 500 [IU]
  Filled 2019-02-22: qty 5

## 2019-02-22 MED ORDER — SODIUM CHLORIDE 0.9% FLUSH
10.0000 mL | INTRAVENOUS | Status: DC | PRN
  Administered 2019-02-22: 10 mL
  Filled 2019-02-22: qty 10

## 2019-02-22 MED ORDER — HEPARIN SOD (PORK) LOCK FLUSH 100 UNIT/ML IV SOLN
INTRAVENOUS | Status: AC
  Filled 2019-02-22: qty 5

## 2019-03-06 ENCOUNTER — Inpatient Hospital Stay (HOSPITAL_BASED_OUTPATIENT_CLINIC_OR_DEPARTMENT_OTHER): Payer: No Typology Code available for payment source | Admitting: Internal Medicine

## 2019-03-06 ENCOUNTER — Inpatient Hospital Stay: Payer: No Typology Code available for payment source

## 2019-03-06 ENCOUNTER — Other Ambulatory Visit: Payer: Self-pay

## 2019-03-06 ENCOUNTER — Encounter: Payer: Self-pay | Admitting: Internal Medicine

## 2019-03-06 VITALS — Resp 20

## 2019-03-06 DIAGNOSIS — Z7189 Other specified counseling: Secondary | ICD-10-CM

## 2019-03-06 DIAGNOSIS — C155 Malignant neoplasm of lower third of esophagus: Secondary | ICD-10-CM

## 2019-03-06 DIAGNOSIS — Z5111 Encounter for antineoplastic chemotherapy: Secondary | ICD-10-CM | POA: Diagnosis not present

## 2019-03-06 LAB — COMPREHENSIVE METABOLIC PANEL
ALT: 20 U/L (ref 0–44)
AST: 26 U/L (ref 15–41)
Albumin: 3.8 g/dL (ref 3.5–5.0)
Alkaline Phosphatase: 127 U/L — ABNORMAL HIGH (ref 38–126)
Anion gap: 10 (ref 5–15)
BUN: 18 mg/dL (ref 6–20)
CO2: 22 mmol/L (ref 22–32)
Calcium: 8.9 mg/dL (ref 8.9–10.3)
Chloride: 106 mmol/L (ref 98–111)
Creatinine, Ser: 0.69 mg/dL (ref 0.61–1.24)
GFR calc Af Amer: >60 mL/min (ref >60)
GFR calc non Af Amer: >60 mL/min (ref >60)
Glucose, Bld: 169 mg/dL — ABNORMAL HIGH (ref 70–99)
Potassium: 4 mmol/L (ref 3.5–5.1)
Sodium: 138 mmol/L (ref 135–145)
Total Bilirubin: 1.2 mg/dL (ref 0.3–1.2)
Total Protein: 7.1 g/dL (ref 6.5–8.1)

## 2019-03-06 LAB — CBC WITH DIFFERENTIAL/PLATELET
Abs Immature Granulocytes: 0.03 10*3/uL (ref 0.00–0.07)
Basophils Absolute: 0 10*3/uL (ref 0.0–0.1)
Basophils Relative: 0 %
Eosinophils Absolute: 0.3 10*3/uL (ref 0.0–0.5)
Eosinophils Relative: 3 %
HCT: 39.4 % (ref 39.0–52.0)
Hemoglobin: 13 g/dL (ref 13.0–17.0)
Immature Granulocytes: 0 %
Lymphocytes Relative: 11 %
Lymphs Abs: 1.2 10*3/uL (ref 0.7–4.0)
MCH: 31.6 pg (ref 26.0–34.0)
MCHC: 33 g/dL (ref 30.0–36.0)
MCV: 95.9 fL (ref 80.0–100.0)
Monocytes Absolute: 1.4 10*3/uL — ABNORMAL HIGH (ref 0.1–1.0)
Monocytes Relative: 12 %
Neutro Abs: 8.2 10*3/uL — ABNORMAL HIGH (ref 1.7–7.7)
Neutrophils Relative %: 74 %
Platelets: 117 10*3/uL — ABNORMAL LOW (ref 150–400)
RBC: 4.11 MIL/uL — ABNORMAL LOW (ref 4.22–5.81)
RDW: 13.4 % (ref 11.5–15.5)
WBC: 11.2 10*3/uL — ABNORMAL HIGH (ref 4.0–10.5)
nRBC: 0 % (ref 0.0–0.2)

## 2019-03-06 MED ORDER — SODIUM CHLORIDE 0.9 % IV SOLN
2400.0000 mg/m2 | INTRAVENOUS | Status: DC
  Administered 2019-03-06: 11:00:00 6450 mg via INTRAVENOUS
  Filled 2019-03-06: qty 100

## 2019-03-06 MED ORDER — DEXAMETHASONE SODIUM PHOSPHATE 10 MG/ML IJ SOLN
10.0000 mg | Freq: Once | INTRAMUSCULAR | Status: AC
  Administered 2019-03-06: 10:00:00 10 mg via INTRAVENOUS
  Filled 2019-03-06: qty 1

## 2019-03-06 MED ORDER — DEXTROSE 5 % IV SOLN
Freq: Once | INTRAVENOUS | Status: AC
  Filled 2019-03-06: qty 250

## 2019-03-06 MED ORDER — HEPARIN SOD (PORK) LOCK FLUSH 100 UNIT/ML IV SOLN
500.0000 [IU] | Freq: Once | INTRAVENOUS | Status: DC
  Filled 2019-03-06: qty 5

## 2019-03-06 MED ORDER — SODIUM CHLORIDE 0.9% FLUSH
10.0000 mL | Freq: Once | INTRAVENOUS | Status: AC
  Administered 2019-03-06: 08:00:00 10 mL via INTRAVENOUS
  Filled 2019-03-06: qty 10

## 2019-03-06 NOTE — Progress Notes (Signed)
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  # June rd week- 5FU pump every 2 days; +RT [until aug 4th]  # 10/16/2018-FOLFOX #1; 9/21-PET- PR. 11/28/2018- cycle #6-decreased the ox dose to 60 mg/2 [sec to platelets- 94]. S/p 12 cycles of FOLFOX; DEC 15th 2020- switched to 5 CIV ONLY; dced Oxaliplatin.   # DM- diet controlled; Hepatitis C; September 2020-right kidney stone/spontaneous; foreign body impaction/spontaneous  # NGS/omniseq- PDL-1 CPS 20%;EGFR-copy number gain; NEG- targets/ Her-2 neu.   DIAGNOSIS: Adenocarcinoma esophagus/GE junction  STAGE: IV     ;GOALS: pallaitive  CURRENT/MOST RECENT THERAPY: 5FU  CIV q 2 weeks [C]      Malignant neoplasm of lower third of esophagus ([place])  08/30/2018 -  Chemotherapy   The patient had palonosetron (ALOXI) injection 0.25 mg, 0.25 mg, Intravenous,  Once, 9 of 9 cycles Administration: 0.25 mg (10/30/2018), 0.25 mg (10/16/2018), 0.25 mg (11/28/2018), 0.25 mg (12/12/2018), 0.25 mg (11/14/2018), 0.25 mg (12/26/2018), 0.25 mg (01/09/2019), 0.25 mg (01/23/2019), 0.25 mg (02/06/2019) leucovorin 1,072 mg in  dextrose 5 % 250 mL infusion, 400 mg/m2 = 1,072 mg, Intravenous,  Once, 11 of 11 cycles Administration: 1,000 mg (10/30/2018), 1,000 mg (10/16/2018), 1,000 mg (11/28/2018), 1,000 mg (12/12/2018), 1,000 mg (12/26/2018), 1,000 mg (01/09/2019), 1,000 mg (01/23/2019), 1,000 mg (02/06/2019) oxaliplatin (ELOXATIN) 230 mg in dextrose 5 % 500 mL chemo infusion, 85 mg/m2 = 230 mg, Intravenous,  Once, 9 of 9 cycles Dose modification: 60 mg/m2 (original dose 85 mg/m2, Cycle 7, Reason: Provider Judgment) Administration: 230 mg (10/30/2018), 230 mg (10/16/2018), 160 mg (11/28/2018), 160 mg (12/12/2018), 230 mg (11/14/2018), 160 mg (12/26/2018), 160 mg (01/09/2019), 160 mg (01/23/2019), 160 mg (02/06/2019) leucovorin injection 54 mg, 20 mg/m2 = 54 mg (100 % of original dose 20 mg/m2), Intravenous,  Once, 8 of 8 cycles Dose modification: 20 mg/m2 (original dose 20 mg/m2, Cycle 2, Reason: Other (see comments), Comment: iv push), 20 mg/m2 (original dose 20 mg/m2, Cycle 3, Reason: Other (see comments), Comment: ivp) Administration: 54 mg (09/13/2018), 54 mg (10/02/2018) fluorouracil (ADRUCIL) 6,450 mg in sodium chloride 0.9 % 121 mL chemo infusion, 2,400 mg/m2 = 6,450 mg, Intravenous, 1 Day/Dose, 13 of 17 cycles Administration: 6,450 mg (08/30/2018), 6,450 mg (09/13/2018), 6,450 mg (10/30/2018), 6,450 mg (10/02/2018), 6,450 mg (10/16/2018), 6,450 mg (11/28/2018), 6,450 mg (12/12/2018), 6,450 mg (11/14/2018), 6,450 mg (12/26/2018), 6,450 mg (01/09/2019), 6,450 mg (01/23/2019), 6,450 mg (02/06/2019), 6,450 mg (02/20/2019)  for chemotherapy treatment.      HISTORY OF PRESENTING ILLNESS:  Matthew Yang 58 y.o.  male stage IV adenocarcinoma esophagus currently on 5-FU CIV chemotherapy is here.   Patient denies any difficulty swallowing.  No nausea no vomiting.  Appetite is good.  No weight loss.  Mild tingling and numbness-however improved since starting oxaliplatin.  No  flank pain.  Review of Systems  Constitutional: Positive for malaise/fatigue.  Negative for chills, diaphoresis and fever.  HENT: Negative for nosebleeds and sore throat.   Eyes: Negative for double vision.  Respiratory: Negative for cough, hemoptysis, sputum production, shortness of breath and wheezing.   Cardiovascular: Negative for chest pain, palpitations, orthopnea and leg swelling.  Gastrointestinal: Negative for abdominal pain, blood in stool, diarrhea, heartburn, melena and nausea.  Genitourinary: Negative for dysuria, frequency and urgency.  Musculoskeletal: Negative for back pain and joint pain.  Skin: Negative.  Negative for itching and rash.  Neurological: Positive for tingling. Negative for dizziness, focal weakness, weakness and headaches.  Endo/Heme/Allergies: Does not bruise/bleed easily.  Psychiatric/Behavioral: Negative for depression. The patient is not nervous/anxious and does not have insomnia.      MEDICAL HISTORY:  Past Medical History:  Diagnosis Date  . Diabetes mellitus without complication ([place])   . Esophagus cancer ([place])   . Hepatitis    HEPATITIS C  . Hypercholesteremia   . Hypertension   . Morbid obesity ([place])   . Positive hepatitis C antibody test   . Sleep apnea   . Tubular adenoma of colon    polyp    SURGICAL HISTORY: Past Surgical History:  Procedure Laterality Date  . CHOLECYSTECTOMY    . COLONOSCOPY    . COLONOSCOPY WITH PROPOFOL N/A 05/04/2016   Procedure: COLONOSCOPY WITH PROPOFOL;  Surgeon: Lollie Sails, MD;  Location[facility] ENDOSCOPY;  Service: Endoscopy;  Laterality: N/A;  . ESOPHAGOGASTRODUODENOSCOPY (EGD) WITH PROPOFOL N/A 08/04/2018   Procedure: ESOPHAGOGASTRODUODENOSCOPY (EGD) WITH PROPOFOL;  Surgeon: Lollie Sails, MD;  Location: [facility] ENDOSCOPY;  Service: Endoscopy;  Laterality: N/A;  . PORTA CATH INSERTION N/A 08/17/2018   Procedure: PORTA CATH INSERTION;  Surgeon: Algernon Huxley, MD;  Location: [facility];  Service: Cardiovascular;  Laterality: N/A;    SOCIAL HISTORY: Social History    Socioeconomic History  . Marital status: Married    Spouse name: Not on file  . Number of children: Not on file  . Years of education: Not on file  . Highest education level: Not on file  Occupational History  . Not on file  Tobacco Use  . Smoking status: Never Smoker  . Smokeless tobacco: Never Used  Substance and Sexual Activity  . Alcohol use: Not Currently  . Drug use: No  . Sexual activity: Not on file  Other Topics Concern  . 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    Social Determinants of Health   Financial Resource Strain:   . Difficulty of Paying Living Expenses: Not on file  Food Insecurity:   . Worried About Charity fundraiser in the Last Year: Not on file  . Ran Out of Food in the Last Year: Not on file  Transportation Needs:   . Lack of Transportation (Medical): Not on file  . Lack of Transportation (Non-Medical): Not on file  Physical Activity:   . Days of Exercise per Week: Not on file  . Minutes of Exercise per Session: Not on file  Stress:   . Feeling of Stress : Not on file  Social Connections:   . Frequency of Communication with Friends and Family: Not on file  . Frequency of Social Gatherings with Friends and Family: Not on file  . Attends Religious Services: Not on file  . Active Member of Clubs or Organizations: Not on  file  . Attends Archivist Meetings: Not on file  . Marital Status: Not on file  Intimate Partner Violence:   . Fear of Current or Ex-Partner: Not on file  . Emotionally Abused: Not on file  . Physically Abused: Not on file  . Sexually Abused: Not on file    FAMILY HISTORY: Family History  Family history unknown: Yes    ALLERGIES:  has No Known Allergies.  MEDICATIONS:  Current Outpatient Medications  Medication Sig Dispense Refill  . atorvastatin (LIPITOR) 20 MG tablet Take 20 mg by mouth daily.    Marland Kitchen lidocaine-prilocaine (EMLA)  cream Apply 1 application topically as needed. 30 g 4  . lisinopril (PRINIVIL,ZESTRIL) 20 MG tablet Take 20 mg by mouth daily.    . Multiple Vitamin (MULTIVITAMIN WITH MINERALS) TABS tablet Take 1 tablet by mouth daily.    . Omega-3 Fatty Acids (FISH OIL) 1000 MG CAPS Take 1,000 mg by mouth daily.     . ondansetron (ZOFRAN) 8 MG tablet Take 1 tablet (8 mg total) by mouth every 8 (eight) hours as needed for nausea or vomiting. 20 tablet 4  . pantoprazole (PROTONIX) 40 MG tablet Take 40 mg by mouth daily.    . prochlorperazine (COMPAZINE) 10 MG tablet Take 1 tablet (10 mg total) by mouth every 6 (six) hours as needed for nausea or vomiting. 30 tablet 4  . Turmeric 500 MG CAPS Take 500 mg by mouth daily.     No current facility-administered medications for this visit.   Facility-Administered Medications Ordered in Other Visits  Medication Dose Route Frequency Provider Last Rate Last Admin  . heparin lock flush 100 unit/mL  500 Units Intravenous Once Charlaine Dalton R, MD          .  PHYSICAL EXAMINATION: ECOG PERFORMANCE STATUS: 1 - Symptomatic but completely ambulatory  Vitals:   03/06/19 0838  BP: 117/82  Pulse: 91  Temp: 98.2 F (36.8 C)   Filed Weights   03/06/19 0838  Weight: (!) 302 lb (137 kg)    Physical Exam  Constitutional: He is oriented to person, place, and time and well-developed, well-nourished, and in no distress.  HENT:  Head: Normocephalic and atraumatic.  Mouth/Throat: Oropharynx is clear and moist. No oropharyngeal exudate.  Eyes: Pupils are equal, round, and reactive to light.  Cardiovascular: Normal rate and regular rhythm.  Pulmonary/Chest: Effort normal and breath sounds normal. No respiratory distress. He has no wheezes.  Abdominal: Soft. Bowel sounds are normal. He exhibits no distension and no mass. There is no abdominal tenderness. There is no rebound and no guarding.  Musculoskeletal:        General: No tenderness or edema. Normal range of  motion.     Cervical back: Normal range of motion and neck supple.  Neurological: He is alert and oriented to person, place, and time.  Skin: Skin is warm.  Psychiatric: Affect normal.     LABORATORY DATA:  I have reviewed the data as listed Lab Results  Component Value Date   WBC 11.2 (H) 03/06/2019   HGB 13.0 03/06/2019   HCT 39.4 03/06/2019   MCV 95.9 03/06/2019   PLT 117 (L) 03/06/2019   Recent Labs    02/06/19 0820 02/20/19 0806 03/06/19 0822  NA 138 138 138  K 3.9 3.9 4.0  CL 108 106 106  CO2 _0 GLUCOSE 197* 161* 169*  BUN _1 CREATININE 0.73 0.67 0.69  CALCIUM 8.6* 8.7* 8.9  GFRNONAA >60 >60 >60  GFRAA >60 >60 >60  PROT 6.8 6.7 7.1  ALBUMIN 3.5 3.5 3.8  AST _0 ALT _1 ALKPHOS 123 123 127*  BILITOT 1.3* 1.2 1.2    RADIOGRAPHIC STUDIES: I have personally reviewed the radiological images as listed and agreed with the findings in the report. CT Chest W Contrast  Result Date: 02/19/2019 CLINICAL DATA:  Esophageal cancer. EXAM: CT CHEST, ABDOMEN, AND PELVIS WITH CONTRAST TECHNIQUE: Multidetector CT imaging of the chest, abdomen and pelvis was performed following the standard protocol during bolus administration of intravenous contrast. CONTRAST:  190m OMNIPAQUE IOHEXOL 300 MG/ML  SOLN COMPARISON:  PET-CT 11/27/2018.  CT scan from 11/20/2018. FINDINGS: CT CHEST FINDINGS Cardiovascular: The heart size is normal. No substantial pericardial effusion. Coronary artery calcification is evident. Atherosclerotic calcification is noted in the wall of the thoracic aorta. Right Port-A-Cath tip is positioned in the distal SVC. Mediastinum/Nodes: No mediastinal lymphadenopathy. There is no hilar lymphadenopathy. The esophagus has normal imaging features. There is no axillary lymphadenopathy. Lungs/Pleura: No suspicious pulmonary nodule or mass. Search no consolidation No pleural effusion. Musculoskeletal: No worrisome lytic or sclerotic osseous abnormality.  CT ABDOMEN PELVIS FINDINGS Hepatobiliary: Nodular liver contour is compatible with cirrhosis. Gallbladder is surgically absent. No intrahepatic or extrahepatic biliary dilation. Pancreas: No focal mass lesion. No dilatation of the main duct. No intraparenchymal cyst. No peripancreatic edema. Spleen: No splenomegaly. No focal mass lesion. Adrenals/Urinary Tract: No adrenal nodule or mass. No evidence for hydroureter. The urinary bladder appears normal for the degree of distention. Stomach/Bowel: Subtle haziness noted around the distal esophagus and esophagogastric junction without mass lesion evident by CT. Stomach otherwise unremarkable. Duodenum is normally positioned as is the ligament of Treitz. No small bowel wall thickening. No small bowel dilatation. The terminal ileum is normal. The appendix is normal. No gross colonic mass. No colonic wall thickening. Vascular/Lymphatic: There is abdominal aortic atherosclerosis without aneurysm. No upper abdominal lymphadenopathy. Index gastrohepatic ligament lymph node has short axis measurement of 7 mm today compared to 10 mm on PET-CT of 11/27/2018. No pelvic sidewall lymphadenopathy. Reproductive: The prostate gland and seminal vesicles are unremarkable. Other: No intraperitoneal free fluid. Musculoskeletal: No worrisome lytic or sclerotic osseous abnormality. IMPRESSION: 1. Stable exam. No new or progressive interval findings since PET-CT 11/27/2018. 2. No obvious distal esophageal mass on today's study with subtle haziness around the distal esophagus and esophagogastric junction, stable. Index gastrohepatic ligament lymph node measured on prior study has decreased slightly in the interval. 3. Liver contour compatible with cirrhosis. 4.  Aortic Atherosclerois (ICD10-170.0) Electronically Signed   By: EMisty StanleyM.D.   On: 02/19/2019 16:01   CT ABDOMEN PELVIS W CONTRAST  Result Date: 02/19/2019 CLINICAL DATA:  Esophageal cancer. EXAM: CT CHEST, ABDOMEN, AND  PELVIS WITH CONTRAST TECHNIQUE: Multidetector CT imaging of the chest, abdomen and pelvis was performed following the standard protocol during bolus administration of intravenous contrast. CONTRAST:  1062mOMNIPAQUE IOHEXOL 300 MG/ML  SOLN COMPARISON:  PET-CT 11/27/2018.  CT scan from 11/20/2018. FINDINGS: CT CHEST FINDINGS Cardiovascular: The heart size is normal. No substantial pericardial effusion. Coronary artery calcification is evident. Atherosclerotic calcification is noted in the wall of the thoracic aorta. Right Port-A-Cath tip is positioned in the distal SVC. Mediastinum/Nodes: No mediastinal lymphadenopathy. There is no hilar lymphadenopathy. The esophagus has normal imaging features. There is no axillary lymphadenopathy. Lungs/Pleura: No suspicious pulmonary nodule or mass. Search no consolidation No pleural effusion. Musculoskeletal: No worrisome lytic or  sclerotic osseous abnormality. CT ABDOMEN PELVIS FINDINGS Hepatobiliary: Nodular liver contour is compatible with cirrhosis. Gallbladder is surgically absent. No intrahepatic or extrahepatic biliary dilation. Pancreas: No focal mass lesion. No dilatation of the main duct. No intraparenchymal cyst. No peripancreatic edema. Spleen: No splenomegaly. No focal mass lesion. Adrenals/Urinary Tract: No adrenal nodule or mass. No evidence for hydroureter. The urinary bladder appears normal for the degree of distention. Stomach/Bowel: Subtle haziness noted around the distal esophagus and esophagogastric junction without mass lesion evident by CT. Stomach otherwise unremarkable. Duodenum is normally positioned as is the ligament of Treitz. No small bowel wall thickening. No small bowel dilatation. The terminal ileum is normal. The appendix is normal. No gross colonic mass. No colonic wall thickening. Vascular/Lymphatic: There is abdominal aortic atherosclerosis without aneurysm. No upper abdominal lymphadenopathy. Index gastrohepatic ligament lymph node has short  axis measurement of 7 mm today compared to 10 mm on PET-CT of 11/27/2018. No pelvic sidewall lymphadenopathy. Reproductive: The prostate gland and seminal vesicles are unremarkable. Other: No intraperitoneal free fluid. Musculoskeletal: No worrisome lytic or sclerotic osseous abnormality. IMPRESSION: 1. Stable exam. No new or progressive interval findings since PET-CT 11/27/2018. 2. No obvious distal esophageal mass on today's study with subtle haziness around the distal esophagus and esophagogastric junction, stable. Index gastrohepatic ligament lymph node measured on prior study has decreased slightly in the interval. 3. Liver contour compatible with cirrhosis. 4.  Aortic Atherosclerois (ICD10-170.0) Electronically Signed   By: Misty Stanley M.D.   On: 02/19/2019 16:01    ASSESSMENT & PLAN:   Malignant neoplasm of lower third of esophagus ([place]) #Adenocarcinoma [poorly differentiated/question signet ring morphology] lower third of esophagus/GE junction. Stage IV.  Currently on 5FU CIV [dced Ox after 12 cycles].   DEC 11th 2020-CT scan- 7 mm retroperitoneal lymph node improved from 10 mm previous. Will plan EGD- in feb 2021.  Discussed that he would be a candidate for immunotherapy if he does have progression of disease on the current therapy.  # Proceed with 5-FU CIV; Labs today reviewed;  acceptable for treatment today.   # Left renal colic- sec to stone- ? Passed- improved; apt with Urology- in Jan 2021.   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    # PN-1-2- cold induced- sec to Oxaliplatin. Stable.   # mucositis- G-1; salt/baking soda rinses.improved.   # Constipation- G-1- ? Anti-emetics-improved.     # DISPOSITION: # 5FU chemo; Treatment today; pump off in 2 days.  # follow up in 2 weeks- MD; labs- cbc/cmp- 5FU IV pump; pump of in 2 days; -Dr.B   All questions were answered. The patient knows to call the clinic with any problems, questions or  concerns.    Cammie Sickle, MD 03/06/2019 9:22 AM

## 2019-03-06 NOTE — Assessment & Plan Note (Addendum)
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  Currently on 5FU CIV [dced Ox after 12 cycles].   DEC 11th 2020-CT scan- 7 mm retroperitoneal lymph node improved from 10 mm previous. Will plan EGD- in feb 2021.  Discussed that he would be a candidate for immunotherapy if he does have progression of disease on the current therapy.  # Proceed with 5-FU CIV; Labs today reviewed;  acceptable for treatment today.   # Left renal colic- sec to stone- ? Passed- improved; apt with Urology- in Jan 2021.   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    # PN-1-2- cold induced- sec to Oxaliplatin. Stable.   # mucositis- G-1; salt/baking soda rinses.improved.   # Constipation- G-1- ? Anti-emetics-improved.     # DISPOSITION: # 5FU chemo; Treatment today; pump off in 2 days.  # follow up in 2 weeks- MD; labs- cbc/cmp- 5FU IV pump; pump of in 2 days; -Dr.B

## 2019-03-08 ENCOUNTER — Inpatient Hospital Stay: Payer: No Typology Code available for payment source

## 2019-03-08 ENCOUNTER — Other Ambulatory Visit: Payer: Self-pay

## 2019-03-08 DIAGNOSIS — Z5111 Encounter for antineoplastic chemotherapy: Secondary | ICD-10-CM | POA: Diagnosis not present

## 2019-03-08 DIAGNOSIS — C155 Malignant neoplasm of lower third of esophagus: Secondary | ICD-10-CM

## 2019-03-08 DIAGNOSIS — Z7189 Other specified counseling: Secondary | ICD-10-CM

## 2019-03-08 MED ORDER — HEPARIN SOD (PORK) LOCK FLUSH 100 UNIT/ML IV SOLN
500.0000 [IU] | Freq: Once | INTRAVENOUS | Status: AC | PRN
  Administered 2019-03-08: 500 [IU]
  Filled 2019-03-08: qty 5

## 2019-03-08 MED ORDER — HEPARIN SOD (PORK) LOCK FLUSH 100 UNIT/ML IV SOLN
INTRAVENOUS | Status: AC
  Filled 2019-03-08: qty 5

## 2019-03-08 MED ORDER — SODIUM CHLORIDE 0.9% FLUSH
10.0000 mL | INTRAVENOUS | Status: DC | PRN
  Administered 2019-03-08: 10 mL
  Filled 2019-03-08: qty 10

## 2019-03-15 ENCOUNTER — Inpatient Hospital Stay: Payer: No Typology Code available for payment source | Attending: Internal Medicine

## 2019-03-15 DIAGNOSIS — E119 Type 2 diabetes mellitus without complications: Secondary | ICD-10-CM | POA: Insufficient documentation

## 2019-03-15 DIAGNOSIS — G4730 Sleep apnea, unspecified: Secondary | ICD-10-CM | POA: Insufficient documentation

## 2019-03-15 DIAGNOSIS — Z79899 Other long term (current) drug therapy: Secondary | ICD-10-CM | POA: Insufficient documentation

## 2019-03-15 DIAGNOSIS — E7800 Pure hypercholesterolemia, unspecified: Secondary | ICD-10-CM | POA: Insufficient documentation

## 2019-03-15 DIAGNOSIS — I10 Essential (primary) hypertension: Secondary | ICD-10-CM | POA: Insufficient documentation

## 2019-03-15 DIAGNOSIS — R5383 Other fatigue: Secondary | ICD-10-CM | POA: Insufficient documentation

## 2019-03-15 DIAGNOSIS — N23 Unspecified renal colic: Secondary | ICD-10-CM | POA: Insufficient documentation

## 2019-03-15 DIAGNOSIS — K5900 Constipation, unspecified: Secondary | ICD-10-CM | POA: Insufficient documentation

## 2019-03-15 DIAGNOSIS — C155 Malignant neoplasm of lower third of esophagus: Secondary | ICD-10-CM | POA: Insufficient documentation

## 2019-03-15 DIAGNOSIS — Z5111 Encounter for antineoplastic chemotherapy: Secondary | ICD-10-CM | POA: Insufficient documentation

## 2019-03-15 DIAGNOSIS — R5381 Other malaise: Secondary | ICD-10-CM | POA: Insufficient documentation

## 2019-03-15 DIAGNOSIS — D696 Thrombocytopenia, unspecified: Secondary | ICD-10-CM | POA: Insufficient documentation

## 2019-03-15 NOTE — Progress Notes (Signed)
Nutrition Follow-up:  Patient with metastatic GE junction adenocarcinoma.  Patient has completed radiation. Patient receiving chemotherapy minus oxaliplatin.  Spoke with patient via phone for nutrition follow-up.  Patient reports that appetite is good and that he is eating all consistencies of foods except for barbecue (lodged in esophagus previously).  Reports that neuropathy is better after stopping oxaliplatin.  Had pain from kidney stone last night and states he needs to drink more water.  Has appointment with urology this month.      Medications: reviewed  Labs: reviewed from 12/29  Anthropometrics:   Weight 302 lb on 12/29 stable from 300 lb 9.6 oz on 11/17 last RD follow-up.    NUTRITION DIAGNOSIS: Inadequate oral intake stable   INTERVENTION:  Encouraged patient to continue with eating well -balanced meals including good sources of protein.  Discussion regarding [facility] Therapy, patient recently heard about this but is not considering at this time.  Patient has contact information.      MONITORING, EVALUATION, GOAL: Patient will consume adequate calories and protein to maintain lean muscle mass    NEXT VISIT: March 11 phone f/u  Othar Curto B. Zenia Resides, [place], [place] Registered Dietitian [phone] (pager)

## 2019-03-19 NOTE — Progress Notes (Signed)
Patient is coming in for follow up, has questions abut his referral.

## 2019-03-20 ENCOUNTER — Inpatient Hospital Stay: Payer: No Typology Code available for payment source

## 2019-03-20 ENCOUNTER — Other Ambulatory Visit: Payer: Self-pay

## 2019-03-20 ENCOUNTER — Inpatient Hospital Stay (HOSPITAL_BASED_OUTPATIENT_CLINIC_OR_DEPARTMENT_OTHER): Payer: No Typology Code available for payment source | Admitting: Internal Medicine

## 2019-03-20 VITALS — BP 108/73 | HR 88 | Resp 18

## 2019-03-20 DIAGNOSIS — G4730 Sleep apnea, unspecified: Secondary | ICD-10-CM | POA: Diagnosis not present

## 2019-03-20 DIAGNOSIS — E119 Type 2 diabetes mellitus without complications: Secondary | ICD-10-CM | POA: Diagnosis not present

## 2019-03-20 DIAGNOSIS — Z5111 Encounter for antineoplastic chemotherapy: Secondary | ICD-10-CM | POA: Diagnosis not present

## 2019-03-20 DIAGNOSIS — K5900 Constipation, unspecified: Secondary | ICD-10-CM | POA: Diagnosis not present

## 2019-03-20 DIAGNOSIS — R5383 Other fatigue: Secondary | ICD-10-CM | POA: Diagnosis not present

## 2019-03-20 DIAGNOSIS — D696 Thrombocytopenia, unspecified: Secondary | ICD-10-CM | POA: Diagnosis not present

## 2019-03-20 DIAGNOSIS — Z95828 Presence of other vascular implants and grafts: Secondary | ICD-10-CM

## 2019-03-20 DIAGNOSIS — Z79899 Other long term (current) drug therapy: Secondary | ICD-10-CM | POA: Diagnosis not present

## 2019-03-20 DIAGNOSIS — E7800 Pure hypercholesterolemia, unspecified: Secondary | ICD-10-CM | POA: Diagnosis not present

## 2019-03-20 DIAGNOSIS — C155 Malignant neoplasm of lower third of esophagus: Secondary | ICD-10-CM

## 2019-03-20 DIAGNOSIS — Z7189 Other specified counseling: Secondary | ICD-10-CM

## 2019-03-20 DIAGNOSIS — N23 Unspecified renal colic: Secondary | ICD-10-CM | POA: Diagnosis not present

## 2019-03-20 DIAGNOSIS — R5381 Other malaise: Secondary | ICD-10-CM | POA: Diagnosis not present

## 2019-03-20 DIAGNOSIS — I10 Essential (primary) hypertension: Secondary | ICD-10-CM | POA: Diagnosis not present

## 2019-03-20 LAB — CBC WITH DIFFERENTIAL/PLATELET
Abs Immature Granulocytes: 0.03 10*3/uL (ref 0.00–0.07)
Basophils Absolute: 0 10*3/uL (ref 0.0–0.1)
Basophils Relative: 0 %
Eosinophils Absolute: 0.2 10*3/uL (ref 0.0–0.5)
Eosinophils Relative: 4 %
HCT: 38.4 % — ABNORMAL LOW (ref 39.0–52.0)
Hemoglobin: 12.5 g/dL — ABNORMAL LOW (ref 13.0–17.0)
Immature Granulocytes: 0 %
Lymphocytes Relative: 16 %
Lymphs Abs: 1.1 10*3/uL (ref 0.7–4.0)
MCH: 31.3 pg (ref 26.0–34.0)
MCHC: 32.6 g/dL (ref 30.0–36.0)
MCV: 96 fL (ref 80.0–100.0)
Monocytes Absolute: 0.9 10*3/uL (ref 0.1–1.0)
Monocytes Relative: 13 %
Neutro Abs: 4.6 10*3/uL (ref 1.7–7.7)
Neutrophils Relative %: 67 %
Platelets: 171 10*3/uL (ref 150–400)
RBC: 4 MIL/uL — ABNORMAL LOW (ref 4.22–5.81)
RDW: 12.5 % (ref 11.5–15.5)
WBC: 6.9 10*3/uL (ref 4.0–10.5)
nRBC: 0 % (ref 0.0–0.2)

## 2019-03-20 LAB — COMPREHENSIVE METABOLIC PANEL
ALT: 19 U/L (ref 0–44)
AST: 23 U/L (ref 15–41)
Albumin: 3.5 g/dL (ref 3.5–5.0)
Alkaline Phosphatase: 125 U/L (ref 38–126)
Anion gap: 8 (ref 5–15)
BUN: 16 mg/dL (ref 6–20)
CO2: 25 mmol/L (ref 22–32)
Calcium: 9 mg/dL (ref 8.9–10.3)
Chloride: 106 mmol/L (ref 98–111)
Creatinine, Ser: 0.93 mg/dL (ref 0.61–1.24)
GFR calc Af Amer: >60 mL/min (ref >60)
GFR calc non Af Amer: >60 mL/min (ref >60)
Glucose, Bld: 188 mg/dL — ABNORMAL HIGH (ref 70–99)
Potassium: 4.1 mmol/L (ref 3.5–5.1)
Sodium: 139 mmol/L (ref 135–145)
Total Bilirubin: 1 mg/dL (ref 0.3–1.2)
Total Protein: 7.5 g/dL (ref 6.5–8.1)

## 2019-03-20 MED ORDER — DEXAMETHASONE SODIUM PHOSPHATE 10 MG/ML IJ SOLN
10.0000 mg | Freq: Once | INTRAMUSCULAR | Status: AC
  Administered 2019-03-20: 10 mg via INTRAVENOUS
  Filled 2019-03-20: qty 1

## 2019-03-20 MED ORDER — SODIUM CHLORIDE 0.9 % IV SOLN
2400.0000 mg/m2 | INTRAVENOUS | Status: DC
  Administered 2019-03-20: 6450 mg via INTRAVENOUS
  Filled 2019-03-20: qty 129

## 2019-03-20 MED ORDER — SODIUM CHLORIDE 0.9% FLUSH
10.0000 mL | Freq: Once | INTRAVENOUS | Status: AC
  Administered 2019-03-20: 10 mL via INTRAVENOUS
  Filled 2019-03-20: qty 10

## 2019-03-20 NOTE — Assessment & Plan Note (Addendum)
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  Currently on 5FU CIV [dced Ox after 12 cycles].   DEC 11th 2020-CT scan- 7 mm retroperitoneal lymph node improved from 10 mm previous. Discussed re: EGD- in Feb 2021 to assess the disease status. Recommend calling [facility].   # Proceed with 5-FU CIV; Labs today reviewed;  acceptable for treatment today.   # Left renal colic- sec to stone- ? Passed-; apt with Urology on 15th Jan 2021.   # Thrombocytopenia-platelets 170s; improved;     # PN-1-2- cold induced- sec to Oxaliplatin. STABLE.   # Constipation- G-1- ? Anti-emetics-STABLE.   # DISPOSITION: # 5FU chemo; Treatment today; pump off in 2 days.  # follow up in 2 weeks- MD; labs- cbc/cmp- 5FU IV pump; pump of in 2 days; -Dr.B  Cc; Dr.[place]/Skulskie

## 2019-03-20 NOTE — Progress Notes (Signed)
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  # June rd week- 5FU pump every 2 days; +RT [until aug 4th]  # 10/16/2018-FOLFOX #1; 9/21-PET- PR. 11/28/2018- cycle #6-decreased the ox dose to 60 mg/2 [sec to platelets- 94]. S/p 12 cycles of FOLFOX; DEC 15th 2020- switched to 5 CIV ONLY; dced Oxaliplatin.   # DM- diet controlled; Hepatitis C; September 2020-right kidney stone/spontaneous; foreign body impaction/spontaneous  # NGS/omniseq- PDL-1 CPS 20%;EGFR-copy number gain; NEG- targets/ Her-2 neu.   DIAGNOSIS: Adenocarcinoma esophagus/GE junction  STAGE: IV     ;GOALS: pallaitive  CURRENT/MOST RECENT THERAPY: 5FU  CIV q 2 weeks [C]      Malignant neoplasm of lower third of esophagus ([place])  08/30/2018 -  Chemotherapy   The patient had palonosetron (ALOXI) injection 0.25 mg, 0.25 mg, Intravenous,  Once, 9 of 9 cycles Administration: 0.25 mg (10/30/2018), 0.25 mg (10/16/2018), 0.25 mg (11/28/2018), 0.25 mg (12/12/2018), 0.25 mg (11/14/2018), 0.25 mg (12/26/2018), 0.25 mg (01/09/2019), 0.25 mg (01/23/2019), 0.25 mg (02/06/2019) leucovorin 1,072 mg in  dextrose 5 % 250 mL infusion, 400 mg/m2 = 1,072 mg, Intravenous,  Once, 11 of 11 cycles Administration: 1,000 mg (10/30/2018), 1,000 mg (10/16/2018), 1,000 mg (11/28/2018), 1,000 mg (12/12/2018), 1,000 mg (12/26/2018), 1,000 mg (01/09/2019), 1,000 mg (01/23/2019), 1,000 mg (02/06/2019) oxaliplatin (ELOXATIN) 230 mg in dextrose 5 % 500 mL chemo infusion, 85 mg/m2 = 230 mg, Intravenous,  Once, 9 of 9 cycles Dose modification: 60 mg/m2 (original dose 85 mg/m2, Cycle 7, Reason: Provider Judgment) Administration: 230 mg (10/30/2018), 230 mg (10/16/2018), 160 mg (11/28/2018), 160 mg (12/12/2018), 230 mg (11/14/2018), 160 mg (12/26/2018), 160 mg (01/09/2019), 160 mg (01/23/2019), 160 mg (02/06/2019) leucovorin injection 54 mg, 20 mg/m2 = 54 mg (100 % of original dose 20 mg/m2), Intravenous,  Once, 8 of 8 cycles Dose modification: 20 mg/m2 (original dose 20 mg/m2, Cycle 2, Reason: Other (see comments), Comment: iv push), 20 mg/m2 (original dose 20 mg/m2, Cycle 3, Reason: Other (see comments), Comment: ivp) Administration: 54 mg (09/13/2018), 54 mg (10/02/2018) fluorouracil (ADRUCIL) 6,450 mg in sodium chloride 0.9 % 121 mL chemo infusion, 2,400 mg/m2 = 6,450 mg, Intravenous, 1 Day/Dose, 15 of 17 cycles Administration: 6,450 mg (08/30/2018), 6,450 mg (09/13/2018), 6,450 mg (10/30/2018), 6,450 mg (10/02/2018), 6,450 mg (10/16/2018), 6,450 mg (11/28/2018), 6,450 mg (12/12/2018), 6,450 mg (11/14/2018), 6,450 mg (12/26/2018), 6,450 mg (01/09/2019), 6,450 mg (01/23/2019), 6,450 mg (02/06/2019), 6,450 mg (02/20/2019), 6,450 mg (03/06/2019), 6,450 mg (03/20/2019)  for chemotherapy treatment.      HISTORY OF PRESENTING ILLNESS:  Matthew Yang 59 y.o.  male stage IV adenocarcinoma esophagus currently on 5-FU CIV chemotherapy is here.   In the interim patient noted to have an episode of left flank pain; felt that he had passed a stone.  No blood  in urine.  Currently pain is improved.   Denies difficulty swallowing.  No nausea no vomiting.   Appetite is good.  No weight loss.  Continues to have mild tingling and numbness in extremities.    Review of Systems  Constitutional: Positive for malaise/fatigue. Negative for chills, diaphoresis and fever.  HENT: Negative for nosebleeds and sore throat.   Eyes: Negative for double vision.  Respiratory: Negative for cough, hemoptysis, sputum production, shortness of breath and wheezing.   Cardiovascular: Negative for chest pain, palpitations, orthopnea and leg swelling.  Gastrointestinal: Negative for abdominal pain, blood in stool, diarrhea, heartburn, melena and nausea.  Genitourinary: Negative for dysuria, frequency and urgency.  Musculoskeletal: Negative for back pain and joint pain.  Skin: Negative.  Negative for itching and rash.  Neurological: Positive for tingling. Negative for dizziness, focal weakness, weakness and headaches.  Endo/Heme/Allergies: Does not bruise/bleed easily.  Psychiatric/Behavioral: Negative for depression. The patient is not nervous/anxious and does not have insomnia.      MEDICAL HISTORY:  Past Medical History:  Diagnosis Date  . Diabetes mellitus without complication ([place])   . Esophagus cancer ([place])   . Hepatitis    HEPATITIS C  . Hypercholesteremia   . Hypertension   . Morbid obesity ([place])   . Positive hepatitis C antibody test   . Sleep apnea   . Tubular adenoma of colon    polyp    SURGICAL HISTORY: Past Surgical History:  Procedure Laterality Date  . CHOLECYSTECTOMY    . COLONOSCOPY    . COLONOSCOPY WITH PROPOFOL N/A 05/04/2016   Procedure: COLONOSCOPY WITH PROPOFOL;  Surgeon: Lollie Sails, MD;  [facility] ENDOSCOPY;  Service: Endoscopy;  Laterality: N/A;  . ESOPHAGOGASTRODUODENOSCOPY (EGD) WITH PROPOFOL N/A 08/04/2018   Procedure: ESOPHAGOGASTRODUODENOSCOPY (EGD) WITH PROPOFOL;  Surgeon: Lollie Sails, MD;  Location: [facility] ENDOSCOPY;  Service: Endoscopy;  Laterality: N/A;  . PORTA CATH INSERTION N/A 08/17/2018   Procedure:  PORTA CATH INSERTION;  Surgeon: Algernon Huxley, MD;  Location: [place] CV LAB;  Service: Cardiovascular;  Laterality: N/A;    SOCIAL HISTORY: Social History   Socioeconomic History  . Marital status: Married    Spouse name: Not on file  . Number of children: Not on file  . Years of education: Not on file  . Highest education level: Not on file  Occupational History  . Not on file  Tobacco Use  . Smoking status: Never Smoker  . Smokeless tobacco: Never Used  Substance and Sexual Activity  . Alcohol use: Not Currently  . Drug use: No  . Sexual activity: Not on file  Other Topics Concern  . 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    Social Determinants of Health   Financial Resource Strain:   . Difficulty of Paying Living Expenses: Not on file  Food Insecurity:   . Worried About Charity fundraiser in the Last Year: Not on file  . Ran Out of Food in the Last Year: Not on file  Transportation Needs:   . Lack of Transportation (Medical): Not on file  . Lack of Transportation (Non-Medical): Not on file  Physical Activity:   . Days of Exercise per Week: Not on file  . Minutes of Exercise per Session: Not on file  Stress:   . Feeling of Stress : Not on file  Social Connections:   . Frequency of Communication with Friends and  Family: Not on file  . Frequency of Social Gatherings with Friends and Family: Not on file  . Attends Religious Services: Not on file  . Active Member of Clubs or Organizations: Not on file  . Attends Archivist Meetings: Not on file  . Marital Status: Not on file  Intimate Partner Violence:   . Fear of Current or Ex-Partner: Not on file  . Emotionally Abused: Not on file  . Physically Abused: Not on file  . Sexually Abused: Not on file    FAMILY HISTORY: Family History  Family history unknown: Yes    ALLERGIES:  has No Known Allergies.  MEDICATIONS:   Current Outpatient Medications  Medication Sig Dispense Refill  . atorvastatin (LIPITOR) 20 MG tablet Take 20 mg by mouth daily.    Marland Kitchen lidocaine-prilocaine (EMLA) cream Apply 1 application topically as needed. 30 g 4  . lisinopril (PRINIVIL,ZESTRIL) 20 MG tablet Take 20 mg by mouth daily.    . Multiple Vitamin (MULTIVITAMIN WITH MINERALS) TABS tablet Take 1 tablet by mouth daily.    . Omega-3 Fatty Acids (FISH OIL) 1000 MG CAPS Take 1,000 mg by mouth daily.     . ondansetron (ZOFRAN) 8 MG tablet Take 1 tablet (8 mg total) by mouth every 8 (eight) hours as needed for nausea or vomiting. 20 tablet 4  . pantoprazole (PROTONIX) 40 MG tablet Take 40 mg by mouth daily.    . prochlorperazine (COMPAZINE) 10 MG tablet Take 1 tablet (10 mg total) by mouth every 6 (six) hours as needed for nausea or vomiting. 30 tablet 4  . Selenium (SELENIMIN PO) Take by mouth.    . Turmeric 500 MG CAPS Take 500 mg by mouth daily.     No current facility-administered medications for this visit.   Facility-Administered Medications Ordered in Other Visits  Medication Dose Route Frequency Provider Last Rate Last Admin  . fluorouracil (ADRUCIL) 6,450 mg in sodium chloride 0.9 % 121 mL chemo infusion  2,400 mg/m2 (Treatment Plan Recorded) Intravenous 1 day or 1 dose Cammie Sickle, MD   6,450 mg at 03/20/19 1108      .  PHYSICAL EXAMINATION: ECOG PERFORMANCE STATUS: 1 - Symptomatic but completely ambulatory  Vitals:   03/20/19 0913  BP: 105/69  Pulse: 92  Temp: (!) 97.3 F (36.3 C)   Filed Weights   03/20/19 0913  Weight: 294 lb (133.4 kg)    Physical Exam  Constitutional: He is oriented to person, place, and time and well-developed, well-nourished, and in no distress.  HENT:  Head: Normocephalic and atraumatic.  Mouth/Throat: Oropharynx is clear and moist. No oropharyngeal exudate.  Eyes: Pupils are equal, round, and reactive to light.  Cardiovascular: Normal rate and regular rhythm.   Pulmonary/Chest: Effort normal and breath sounds normal. No respiratory distress. He has no wheezes.  Abdominal: Soft. Bowel sounds are normal. He exhibits no distension and no mass. There is no abdominal tenderness. There is no rebound and no guarding.  Musculoskeletal:        General: No tenderness or edema. Normal range of motion.     Cervical back: Normal range of motion and neck supple.  Neurological: He is alert and oriented to person, place, and time.  Skin: Skin is warm.  Psychiatric: Affect normal.     LABORATORY DATA:  I have reviewed the data as listed Lab Results  Component Value Date   WBC 6.9 03/20/2019   HGB 12.5 (L) 03/20/2019   HCT 38.4 (L) 03/20/2019  MCV 96.0 03/20/2019   PLT 171 03/20/2019   Recent Labs    02/20/19 0806 03/06/19 0822 03/20/19 0820  NA 138 138 139  K 3.9 4.0 4.1  CL 106 106 106  CO2 _0 GLUCOSE 161* 169* 188*  BUN _1 CREATININE 0.67 0.69 0.93  CALCIUM 8.7* 8.9 9.0  GFRNONAA >60 >60 >60  GFRAA >60 >60 >60  PROT 6.7 7.1 7.5  ALBUMIN 3.5 3.8 3.5  AST _2 ALT _3 ALKPHOS 123 127* 125  BILITOT 1.2 1.2 1.0    RADIOGRAPHIC STUDIES: I have personally reviewed the radiological images as listed and agreed with the findings in the report. CT Chest W Contrast  Result Date: 02/19/2019 CLINICAL DATA:  Esophageal cancer. EXAM: CT CHEST, ABDOMEN, AND PELVIS WITH CONTRAST TECHNIQUE: Multidetector CT imaging of the chest, abdomen and pelvis was performed following the standard protocol during bolus administration of intravenous contrast. CONTRAST:  134m OMNIPAQUE IOHEXOL 300 MG/ML  SOLN COMPARISON:  PET-CT 11/27/2018.  CT scan from 11/20/2018. FINDINGS: CT CHEST FINDINGS Cardiovascular: The heart size is normal. No substantial pericardial effusion. Coronary artery calcification is evident. Atherosclerotic calcification is noted in the wall of the thoracic aorta. Right Port-A-Cath tip is positioned in the distal SVC.  Mediastinum/Nodes: No mediastinal lymphadenopathy. There is no hilar lymphadenopathy. The esophagus has normal imaging features. There is no axillary lymphadenopathy. Lungs/Pleura: No suspicious pulmonary nodule or mass. Search no consolidation No pleural effusion. Musculoskeletal: No worrisome lytic or sclerotic osseous abnormality. CT ABDOMEN PELVIS FINDINGS Hepatobiliary: Nodular liver contour is compatible with cirrhosis. Gallbladder is surgically absent. No intrahepatic or extrahepatic biliary dilation. Pancreas: No focal mass lesion. No dilatation of the main duct. No intraparenchymal cyst. No peripancreatic edema. Spleen: No splenomegaly. No focal mass lesion. Adrenals/Urinary Tract: No adrenal nodule or mass. No evidence for hydroureter. The urinary bladder appears normal for the degree of distention. Stomach/Bowel: Subtle haziness noted around the distal esophagus and esophagogastric junction without mass lesion evident by CT. Stomach otherwise unremarkable. Duodenum is normally positioned as is the ligament of Treitz. No small bowel wall thickening. No small bowel dilatation. The terminal ileum is normal. The appendix is normal. No gross colonic mass. No colonic wall thickening. Vascular/Lymphatic: There is abdominal aortic atherosclerosis without aneurysm. No upper abdominal lymphadenopathy. Index gastrohepatic ligament lymph node has short axis measurement of 7 mm today compared to 10 mm on PET-CT of 11/27/2018. No pelvic sidewall lymphadenopathy. Reproductive: The prostate gland and seminal vesicles are unremarkable. Other: No intraperitoneal free fluid. Musculoskeletal: No worrisome lytic or sclerotic osseous abnormality. IMPRESSION: 1. Stable exam. No new or progressive interval findings since PET-CT 11/27/2018. 2. No obvious distal esophageal mass on today's study with subtle haziness around the distal esophagus and esophagogastric junction, stable. Index gastrohepatic ligament lymph node measured on  prior study has decreased slightly in the interval. 3. Liver contour compatible with cirrhosis. 4.  Aortic Atherosclerois (ICD10-170.0) Electronically Signed   By: EMisty StanleyM.D.   On: 02/19/2019 16:01   CT ABDOMEN PELVIS W CONTRAST  Result Date: 02/19/2019 CLINICAL DATA:  Esophageal cancer. EXAM: CT CHEST, ABDOMEN, AND PELVIS WITH CONTRAST TECHNIQUE: Multidetector CT imaging of the chest, abdomen and pelvis was performed following the standard protocol during bolus administration of intravenous contrast. CONTRAST:  1019mOMNIPAQUE IOHEXOL 300 MG/ML  SOLN COMPARISON:  PET-CT 11/27/2018.  CT scan from 11/20/2018. FINDINGS: CT CHEST FINDINGS Cardiovascular: The heart size is normal. No substantial pericardial effusion. Coronary  artery calcification is evident. Atherosclerotic calcification is noted in the wall of the thoracic aorta. Right Port-A-Cath tip is positioned in the distal SVC. Mediastinum/Nodes: No mediastinal lymphadenopathy. There is no hilar lymphadenopathy. The esophagus has normal imaging features. There is no axillary lymphadenopathy. Lungs/Pleura: No suspicious pulmonary nodule or mass. Search no consolidation No pleural effusion. Musculoskeletal: No worrisome lytic or sclerotic osseous abnormality. CT ABDOMEN PELVIS FINDINGS Hepatobiliary: Nodular liver contour is compatible with cirrhosis. Gallbladder is surgically absent. No intrahepatic or extrahepatic biliary dilation. Pancreas: No focal mass lesion. No dilatation of the main duct. No intraparenchymal cyst. No peripancreatic edema. Spleen: No splenomegaly. No focal mass lesion. Adrenals/Urinary Tract: No adrenal nodule or mass. No evidence for hydroureter. The urinary bladder appears normal for the degree of distention. Stomach/Bowel: Subtle haziness noted around the distal esophagus and esophagogastric junction without mass lesion evident by CT. Stomach otherwise unremarkable. Duodenum is normally positioned as is the ligament of  Treitz. No small bowel wall thickening. No small bowel dilatation. The terminal ileum is normal. The appendix is normal. No gross colonic mass. No colonic wall thickening. Vascular/Lymphatic: There is abdominal aortic atherosclerosis without aneurysm. No upper abdominal lymphadenopathy. Index gastrohepatic ligament lymph node has short axis measurement of 7 mm today compared to 10 mm on PET-CT of 11/27/2018. No pelvic sidewall lymphadenopathy. Reproductive: The prostate gland and seminal vesicles are unremarkable. Other: No intraperitoneal free fluid. Musculoskeletal: No worrisome lytic or sclerotic osseous abnormality. IMPRESSION: 1. Stable exam. No new or progressive interval findings since PET-CT 11/27/2018. 2. No obvious distal esophageal mass on today's study with subtle haziness around the distal esophagus and esophagogastric junction, stable. Index gastrohepatic ligament lymph node measured on prior study has decreased slightly in the interval. 3. Liver contour compatible with cirrhosis. 4.  Aortic Atherosclerois (ICD10-170.0) Electronically Signed   By: Misty Stanley M.D.   On: 02/19/2019 16:01    ASSESSMENT & PLAN:   Malignant neoplasm of lower third of esophagus ([place]) #Adenocarcinoma [poorly differentiated/question signet ring morphology] lower third of esophagus/GE junction. Stage IV.  Currently on 5FU CIV [dced Ox after 12 cycles].   DEC 11th 2020-CT scan- 7 mm retroperitoneal lymph node improved from 10 mm previous. Discussed re: EGD- in Feb 2021 to assess the disease status. Recommend calling [facility].   # Proceed with 5-FU CIV; Labs today reviewed;  acceptable for treatment today.   # Left renal colic- sec to stone- ? Passed-; apt with Urology on 15th Jan 2021.   # Thrombocytopenia-platelets 170s; improved;     # PN-1-2- cold induced- sec to Oxaliplatin. STABLE.   # Constipation- G-1- ? Anti-emetics-STABLE.   # DISPOSITION: # 5FU chemo; Treatment today; pump off in 2 days.  # follow  up in 2 weeks- MD; labs- cbc/cmp- 5FU IV pump; pump of in 2 days; -Dr.B  Cc; Dr.[place]/Skulskie   All questions were answered. The patient knows to call the clinic with any problems, questions or concerns.    Cammie Sickle, MD 03/20/2019 12:52 PM

## 2019-03-22 ENCOUNTER — Other Ambulatory Visit: Payer: Self-pay

## 2019-03-22 ENCOUNTER — Inpatient Hospital Stay: Payer: No Typology Code available for payment source

## 2019-03-22 VITALS — BP 106/64 | HR 90 | Resp 18

## 2019-03-22 DIAGNOSIS — Z5111 Encounter for antineoplastic chemotherapy: Secondary | ICD-10-CM | POA: Diagnosis not present

## 2019-03-22 DIAGNOSIS — C155 Malignant neoplasm of lower third of esophagus: Secondary | ICD-10-CM

## 2019-03-22 DIAGNOSIS — Z7189 Other specified counseling: Secondary | ICD-10-CM

## 2019-03-22 MED ORDER — HEPARIN SOD (PORK) LOCK FLUSH 100 UNIT/ML IV SOLN
500.0000 [IU] | Freq: Once | INTRAVENOUS | Status: AC | PRN
  Administered 2019-03-22: 500 [IU]
  Filled 2019-03-22: qty 5

## 2019-03-23 ENCOUNTER — Ambulatory Visit (INDEPENDENT_AMBULATORY_CARE_PROVIDER_SITE_OTHER): Payer: No Typology Code available for payment source | Admitting: Urology

## 2019-03-23 ENCOUNTER — Encounter: Payer: Self-pay | Admitting: Urology

## 2019-03-23 VITALS — BP 104/69 | HR 80 | Ht 71.0 in | Wt 292.4 lb

## 2019-03-23 DIAGNOSIS — N200 Calculus of kidney: Secondary | ICD-10-CM

## 2019-03-23 LAB — URINALYSIS, COMPLETE
Bilirubin, UA: NEGATIVE
Glucose, UA: NEGATIVE
Ketones, UA: NEGATIVE
Nitrite, UA: POSITIVE — AB
Protein,UA: NEGATIVE
Specific Gravity, UA: 1.02 (ref 1.005–1.030)
Urobilinogen, Ur: 0.2 mg/dL (ref 0.2–1.0)
pH, UA: 5.5 (ref 5.0–7.5)

## 2019-03-23 LAB — MICROSCOPIC EXAMINATION: WBC, UA: >30 /hpf — AB (ref 0–5)

## 2019-03-23 MED ORDER — CEFUROXIME AXETIL 500 MG PO TABS: 500.0000 mg | ORAL_TABLET | Freq: Two times a day (BID) | ORAL | 0 refills | Status: AC

## 2019-03-23 NOTE — Progress Notes (Signed)
03/23/2019 3:36 PM   Matthew Yang 1960/08/16 [id number]  Referring provider: Dr. Rogue Bussing  Chief Complaint  Patient presents with  . Nephrolithiasis    HPI: 59 y.o. male seen in consultation at the request of Dr. Rogue Bussing for nephrolithiasis.  He is followed poorly differentiated adenocarcinoma of the lower one third esophagus/GE junction currently undergoing chemotherapy.  He had an episode of right abdominal pain in September 2020 and a CT was obtained which did show fullness of the right collecting system and ureter with a 2 mm calcification either in the bladder or at the UVJ.  He passed a small stone in his pain resolved.  Since that time he has had 2 episodes of left flank pain.  He is also had mild dysuria, frequency and urgency.  Denies fever, chills, nausea or vomiting.  A repeat CT performed 02/19/2019 showed absence of the previously seen bladder/UVJ calculus.  The fullness of the right collecting system had resolved.  No left-sided calculi or hydronephrosis was identified.  Denies previous history of stone disease.   PMH: Past Medical History:  Diagnosis Date  . Diabetes mellitus without complication ([place])   . Esophagus cancer ([place])   . Hepatitis    HEPATITIS C  . Hypercholesteremia   . Hypertension   . Morbid obesity ([place])   . Positive hepatitis C antibody test   . Sleep apnea   . Tubular adenoma of colon    polyp    Surgical History: Past Surgical History:  Procedure Laterality Date  . CHOLECYSTECTOMY    . COLONOSCOPY    . COLONOSCOPY WITH PROPOFOL N/A 05/04/2016   Procedure: COLONOSCOPY WITH PROPOFOL;  Surgeon: Lollie Sails, MD;  Location[facility] ENDOSCOPY;  Service: Endoscopy;  Laterality: N/A;  . ESOPHAGOGASTRODUODENOSCOPY (EGD) WITH PROPOFOL N/A 08/04/2018   Procedure: ESOPHAGOGASTRODUODENOSCOPY (EGD) WITH PROPOFOL;  Surgeon: Lollie Sails, MD;  Location: [facility] ENDOSCOPY;  Service: Endoscopy;  Laterality: N/A;  . PORTA CATH INSERTION N/A  08/17/2018   Procedure: PORTA CATH INSERTION;  Surgeon: Algernon Huxley, MD;  Location: [place] CV LAB;  Service: Cardiovascular;  Laterality: N/A;    Home Medications:  Allergies as of 03/23/2019   No Known Allergies     Medication List       Accurate as of March 23, 2019  3:36 PM. If you have any questions, ask your nurse or doctor.        atorvastatin 20 MG tablet Commonly known as: LIPITOR Take 20 mg by mouth daily.   Fish Oil 1000 MG Caps Take 1,000 mg by mouth daily.   lidocaine-prilocaine cream Commonly known as: EMLA Apply 1 application topically as needed.   lisinopril 20 MG tablet Commonly known as: ZESTRIL Take 20 mg by mouth daily.   multivitamin with minerals Tabs tablet Take 1 tablet by mouth daily.   ondansetron 8 MG tablet Commonly known as: ZOFRAN Take 1 tablet (8 mg total) by mouth every 8 (eight) hours as needed for nausea or vomiting.   pantoprazole 40 MG tablet Commonly known as: PROTONIX Take 40 mg by mouth daily.   prochlorperazine 10 MG tablet Commonly known as: COMPAZINE Take 1 tablet (10 mg total) by mouth every 6 (six) hours as needed for nausea or vomiting.   SELENIMIN PO Take by mouth.   Turmeric 500 MG Caps Take 500 mg by mouth daily.       Allergies: No Known Allergies  Family History: Family History  Family history unknown: Yes    Social  History:  reports that he has never smoked. He has never used smokeless tobacco. He reports previous alcohol use. He reports that he does not use drugs.  ROS: UROLOGY Frequent Urination?: No Hard to postpone urination?: No Burning/pain with urination?: No Get up at night to urinate?: No Leakage of urine?: No Urine stream starts and stops?: No Trouble starting stream?: No Do you have to strain to urinate?: No Blood in urine?: No Urinary tract infection?: No Sexually transmitted disease?: No Injury to kidneys or bladder?: No Painful intercourse?: No Weak stream?:  No Erection problems?: No Penile pain?: No  Gastrointestinal Nausea?: No Vomiting?: No Indigestion/heartburn?: No Diarrhea?: No Constipation?: No  Constitutional Fever: No Night sweats?: No Weight loss?: No Fatigue?: No  Skin Skin rash/lesions?: No Itching?: No  Eyes Blurred vision?: No Double vision?: No  Ears/Nose/Throat Sore throat?: No Sinus problems?: No  Hematologic/Lymphatic Swollen glands?: No Easy bruising?: No  Cardiovascular Leg swelling?: No Chest pain?: No  Respiratory Cough?: No Shortness of breath?: No  Endocrine Excessive thirst?: No  Musculoskeletal Back pain?: No Joint pain?: No  Neurological Headaches?: No Dizziness?: No  Psychologic Depression?: No Anxiety?: No  Physical Exam: BP 104/69   Pulse 80   Ht 5\' 11"  (1.803 m)   Wt 292 lb 6.4 oz (132.6 kg)   BMI 40.78 kg/m   Constitutional:  Alert and oriented, No acute distress. HEENT: [place] AT, moist mucus membranes.  Trachea midline, no masses. Cardiovascular: No clubbing, cyanosis, or edema. Respiratory: Normal respiratory effort, no increased work of breathing. Skin: No rashes, bruises or suspicious lesions. Neurologic: Grossly intact, no focal deficits, moving all 4 extremities. Psychiatric: Normal mood and affect.  Laboratory Data:  Urinalysis Dipstick nitrite +1+ leukocytes Microscopy >30 WBC, many bacteria  Pertinent Imaging: CT scan September 2020/December 2020 were personally reviewed  Assessment & Plan:   59 y.o. male with a history of right abdominal pain and a stone at the UVJ which subsequently passed with resolution of his symptoms.  Urine culture at that time was negative.  He has had recurrent left flank pain and storage related voiding symptoms.  A follow-up CT showed no left-sided calculi.  Urine today is consistent with infection which may be accounting for his symptoms.  Urine was sent for culture and antibiotic Rx was sent to pharmacy pending the  culture results.  Should he develop fever or worsening symptoms over the weekend he was instructed to call oncology or proceed to the ED.  Follow-up urinalysis 3-4 weeks  He was given literature on stone prevention with cornerstone being increase water intake to keep urine output greater than 2 L/day.   Abbie Sons, [place] [address], [place], [place] [address] [phone]

## 2019-03-26 ENCOUNTER — Encounter: Payer: Self-pay | Admitting: Urology

## 2019-03-26 ENCOUNTER — Telehealth: Payer: Self-pay | Admitting: *Deleted

## 2019-03-26 LAB — CULTURE, URINE COMPREHENSIVE

## 2019-03-26 NOTE — Telephone Encounter (Signed)
Notified patient as instructed , left message on vocal mail

## 2019-03-26 NOTE — Telephone Encounter (Signed)
-----   Message from Abbie Sons, MD sent at 03/26/2019  9:13 AM EST ----- Urine culture positive for infection.  Bacteria was sensitive to prescribed antibiotic.  Scheduled for follow-up urinalysis next month.

## 2019-04-03 ENCOUNTER — Other Ambulatory Visit: Payer: Self-pay

## 2019-04-03 ENCOUNTER — Inpatient Hospital Stay (HOSPITAL_BASED_OUTPATIENT_CLINIC_OR_DEPARTMENT_OTHER): Payer: No Typology Code available for payment source | Admitting: Internal Medicine

## 2019-04-03 ENCOUNTER — Inpatient Hospital Stay: Payer: No Typology Code available for payment source

## 2019-04-03 DIAGNOSIS — Z7189 Other specified counseling: Secondary | ICD-10-CM

## 2019-04-03 DIAGNOSIS — C155 Malignant neoplasm of lower third of esophagus: Secondary | ICD-10-CM | POA: Diagnosis not present

## 2019-04-03 DIAGNOSIS — Z5111 Encounter for antineoplastic chemotherapy: Secondary | ICD-10-CM | POA: Diagnosis not present

## 2019-04-03 LAB — COMPREHENSIVE METABOLIC PANEL
ALT: 23 U/L (ref 0–44)
AST: 28 U/L (ref 15–41)
Albumin: 3.8 g/dL (ref 3.5–5.0)
Alkaline Phosphatase: 117 U/L (ref 38–126)
Anion gap: 7 (ref 5–15)
BUN: 15 mg/dL (ref 6–20)
CO2: 25 mmol/L (ref 22–32)
Calcium: 8.9 mg/dL (ref 8.9–10.3)
Chloride: 103 mmol/L (ref 98–111)
Creatinine, Ser: 0.96 mg/dL (ref 0.61–1.24)
GFR calc Af Amer: >60 mL/min (ref >60)
GFR calc non Af Amer: >60 mL/min (ref >60)
Glucose, Bld: 159 mg/dL — ABNORMAL HIGH (ref 70–99)
Potassium: 4.1 mmol/L (ref 3.5–5.1)
Sodium: 135 mmol/L (ref 135–145)
Total Bilirubin: 1.2 mg/dL (ref 0.3–1.2)
Total Protein: 7.2 g/dL (ref 6.5–8.1)

## 2019-04-03 LAB — CBC WITH DIFFERENTIAL/PLATELET
Abs Immature Granulocytes: 0.03 10*3/uL (ref 0.00–0.07)
Basophils Absolute: 0 10*3/uL (ref 0.0–0.1)
Basophils Relative: 0 %
Eosinophils Absolute: 0.2 10*3/uL (ref 0.0–0.5)
Eosinophils Relative: 3 %
HCT: 39.1 % (ref 39.0–52.0)
Hemoglobin: 13.1 g/dL (ref 13.0–17.0)
Immature Granulocytes: 0 %
Lymphocytes Relative: 21 %
Lymphs Abs: 1.5 10*3/uL (ref 0.7–4.0)
MCH: 31.3 pg (ref 26.0–34.0)
MCHC: 33.5 g/dL (ref 30.0–36.0)
MCV: 93.5 fL (ref 80.0–100.0)
Monocytes Absolute: 1 10*3/uL (ref 0.1–1.0)
Monocytes Relative: 15 %
Neutro Abs: 4.2 10*3/uL (ref 1.7–7.7)
Neutrophils Relative %: 61 %
Platelets: 146 10*3/uL — ABNORMAL LOW (ref 150–400)
RBC: 4.18 MIL/uL — ABNORMAL LOW (ref 4.22–5.81)
RDW: 12.7 % (ref 11.5–15.5)
WBC: 7 10*3/uL (ref 4.0–10.5)
nRBC: 0 % (ref 0.0–0.2)

## 2019-04-03 MED ORDER — DEXAMETHASONE SODIUM PHOSPHATE 10 MG/ML IJ SOLN
10.0000 mg | Freq: Once | INTRAMUSCULAR | Status: AC
  Administered 2019-04-03: 10 mg via INTRAVENOUS
  Filled 2019-04-03: qty 1

## 2019-04-03 MED ORDER — SODIUM CHLORIDE 0.9 % IV SOLN
Freq: Once | INTRAVENOUS | Status: AC
  Filled 2019-04-03: qty 250

## 2019-04-03 MED ORDER — SODIUM CHLORIDE 0.9% FLUSH
10.0000 mL | INTRAVENOUS | Status: DC | PRN
  Administered 2019-04-03: 10 mL via INTRAVENOUS
  Filled 2019-04-03: qty 10

## 2019-04-03 MED ORDER — SODIUM CHLORIDE 0.9 % IV SOLN
2400.0000 mg/m2 | INTRAVENOUS | Status: DC
  Administered 2019-04-03: 11:00:00 6450 mg via INTRAVENOUS
  Filled 2019-04-03: qty 129

## 2019-04-03 NOTE — Assessment & Plan Note (Addendum)
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  Currently on 5FU CIV [dced Ox after 12 cycles].   DEC 11th 2020-CT scan- 7 mm retroperitoneal lymph node improved from 10 mm previous. STABLE. Again  Discussed re: EGD- in Feb 2021 to assess the disease status in next month or so.   # Proceed with 5-FU CIV; Labs today reviewed;  acceptable for treatment today.  We will again repeat imaging sometime in mid March 2021.  # Left renal colic; UTI-s/p anti- Thrombocytopenia-platelets 170s; improved;anti- biotics- improved.  # PN-1-2- cold induced- sec to Oxaliplatin. STABLE.    # Constipation- G-1- ? Anti-emetics-stable.   # DISPOSITION: # 5FU chemo; Treatment today; pump off in 2 days.  # follow up in 2 weeks- MD; labs- cbc/cmp- 5FU IV pump; pump of in 2 days; -Dr.B  Cc; Dr.Toledo/Skulskie

## 2019-04-03 NOTE — Progress Notes (Signed)
[facility] NOTE  Patient Care Team: Kirk Ruths, MD as PCP - General (Internal Medicine) Lucky Cowboy, Erskine Squibb, MD as Consulting Physician (Vascular Surgery) Clent Jacks, RN as Registered Nurse  CHIEF COMPLAINTS/PURPOSE OF CONSULTATION: Gastroesophageal cancer  #  Oncology History Overview Note  # June 2020-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  # June rd week- 5FU pump every 2 days; +RT [until aug 4th]  # 10/16/2018-FOLFOX #1; 9/21-PET- PR. 11/28/2018- cycle #6-decreased the ox dose to 60 mg/2 [sec to platelets- 94]. S/p 12 cycles of FOLFOX; DEC 15th 2020- switched to 5 CIV ONLY; dced Oxaliplatin.   # DM- diet controlled; Hepatitis C; September 2020-right kidney stone/spontaneous; foreign body impaction/spontaneous  # NGS/omniseq- PDL-1 CPS 20%;EGFR-copy number gain; NEG- targets/ Her-2 neu.   DIAGNOSIS: Adenocarcinoma esophagus/GE junction  STAGE: IV     ;GOALS: pallaitive  CURRENT/MOST RECENT THERAPY: 5FU  CIV q 2 weeks [C]      Malignant neoplasm of lower third of esophagus ([place])  08/30/2018 -  Chemotherapy   The patient had palonosetron (ALOXI) injection 0.25 mg, 0.25 mg, Intravenous,  Once, 9 of 9 cycles Administration: 0.25 mg (10/30/2018), 0.25 mg (10/16/2018), 0.25 mg (11/28/2018), 0.25 mg (12/12/2018), 0.25 mg (11/14/2018), 0.25 mg (12/26/2018), 0.25 mg (01/09/2019), 0.25 mg (01/23/2019), 0.25 mg (02/06/2019) leucovorin 1,072 mg in  dextrose 5 % 250 mL infusion, 400 mg/m2 = 1,072 mg, Intravenous,  Once, 11 of 11 cycles Administration: 1,000 mg (10/30/2018), 1,000 mg (10/16/2018), 1,000 mg (11/28/2018), 1,000 mg (12/12/2018), 1,000 mg (12/26/2018), 1,000 mg (01/09/2019), 1,000 mg (01/23/2019), 1,000 mg (02/06/2019) oxaliplatin (ELOXATIN) 230 mg in dextrose 5 % 500 mL chemo infusion, 85 mg/m2 = 230 mg, Intravenous,  Once, 9 of 9 cycles Dose modification: 60 mg/m2 (original dose 85 mg/m2, Cycle 7, Reason: Provider Judgment) Administration: 230 mg (10/30/2018), 230 mg (10/16/2018), 160 mg (11/28/2018), 160 mg (12/12/2018), 230 mg (11/14/2018), 160 mg (12/26/2018), 160 mg (01/09/2019), 160 mg (01/23/2019), 160 mg (02/06/2019) leucovorin injection 54 mg, 20 mg/m2 = 54 mg (100 % of original dose 20 mg/m2), Intravenous,  Once, 8 of 8 cycles Dose modification: 20 mg/m2 (original dose 20 mg/m2, Cycle 2, Reason: Other (see comments), Comment: iv push), 20 mg/m2 (original dose 20 mg/m2, Cycle 3, Reason: Other (see comments), Comment: ivp) Administration: 54 mg (09/13/2018), 54 mg (10/02/2018) fluorouracil (ADRUCIL) 6,450 mg in sodium chloride 0.9 % 121 mL chemo infusion, 2,400 mg/m2 = 6,450 mg, Intravenous, 1 Day/Dose, 15 of 20 cycles Administration: 6,450 mg (08/30/2018), 6,450 mg (09/13/2018), 6,450 mg (10/30/2018), 6,450 mg (10/02/2018), 6,450 mg (10/16/2018), 6,450 mg (11/28/2018), 6,450 mg (12/12/2018), 6,450 mg (11/14/2018), 6,450 mg (12/26/2018), 6,450 mg (01/09/2019), 6,450 mg (01/23/2019), 6,450 mg (02/06/2019), 6,450 mg (02/20/2019), 6,450 mg (03/06/2019), 6,450 mg (03/20/2019)  for chemotherapy treatment.      HISTORY OF PRESENTING ILLNESS:  Matthew Yang 59 y.o.  male stage IV adenocarcinoma esophagus currently on 5-FU CIV chemotherapy is here.   In the interim patient evaluated by urology for his kidney stones-treated with antibiotic for UTI.  He denies any difficulty swallowing.  No  nausea no vomiting.  Mild tingling or numbness.  No skin  rash.  Review of Systems  Constitutional: Positive for malaise/fatigue. Negative for chills, diaphoresis and fever.  HENT: Negative for nosebleeds and sore throat.   Eyes: Negative for double vision.  Respiratory: Negative for cough, hemoptysis, sputum production, shortness of breath and wheezing.   Cardiovascular: Negative for chest pain, palpitations, orthopnea and leg swelling.  Gastrointestinal: Negative for abdominal pain, blood in stool, diarrhea, heartburn, melena and nausea.  Genitourinary: Negative for dysuria, frequency and urgency.  Musculoskeletal: Negative for back pain and joint pain.  Skin: Negative.  Negative for itching and rash.  Neurological: Positive for tingling. Negative for dizziness, focal weakness, weakness and headaches.  Endo/Heme/Allergies: Does not bruise/bleed easily.  Psychiatric/Behavioral: Negative for depression. The patient is not nervous/anxious and does not have insomnia.      MEDICAL HISTORY:  Past Medical History:  Diagnosis Date  . Diabetes mellitus without complication ([place])   . Esophagus cancer ([place])   . Hepatitis    HEPATITIS C  . Hypercholesteremia   . Hypertension   . Morbid obesity ([place])   . Positive hepatitis C antibody test   . Sleep apnea   . Tubular adenoma of colon    polyp    SURGICAL HISTORY: Past Surgical History:  Procedure Laterality Date  . CHOLECYSTECTOMY    . COLONOSCOPY    . COLONOSCOPY WITH PROPOFOL N/A 05/04/2016   Procedure: COLONOSCOPY WITH PROPOFOL;  Surgeon: Lollie Sails, MD;  Location: [facility] ENDOSCOPY;  Service: Endoscopy;  Laterality: N/A;  . ESOPHAGOGASTRODUODENOSCOPY (EGD) WITH PROPOFOL N/A 08/04/2018   Procedure: ESOPHAGOGASTRODUODENOSCOPY (EGD) WITH PROPOFOL;  Surgeon: Lollie Sails, MD;  Location: [facility] ENDOSCOPY;  Service: Endoscopy;  Laterality: N/A;  . PORTA CATH INSERTION N/A 08/17/2018   Procedure: PORTA CATH INSERTION;  Surgeon: Algernon Huxley, MD;  Location: [place] CV LAB;  Service:  Cardiovascular;  Laterality: N/A;    SOCIAL HISTORY: Social History   Socioeconomic History  . Marital status: Married    Spouse name: Not on file  . Number of children: Not on file  . Years of education: Not on file  . Highest education level: Not on file  Occupational History  . Not on file  Tobacco Use  . Smoking status: Never Smoker  . Smokeless tobacco: Never Used  Substance and Sexual Activity  . Alcohol use: Not Currently  . Drug use: No  . Sexual activity: Not on file  Other Topics Concern  . 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    Social Determinants of Health   Financial Resource Strain:   . Difficulty of Paying Living Expenses: Not on file  Food Insecurity:   . Worried About Charity fundraiser in the Last Year: Not on file  . Ran Out of Food in the Last Year: Not on file  Transportation Needs:   . Lack of Transportation (Medical): Not on file  . Lack of Transportation (Non-Medical): Not on file  Physical Activity:   . Days of Exercise per Week: Not on file  . Minutes of Exercise per Session: Not on file  Stress:   . Feeling of Stress : Not on file  Social Connections:   . Frequency of Communication with Friends and Family: Not on file  . Frequency of Social Gatherings with Friends and Family: Not on file  . Attends Religious Services: Not on file  .  Active Member of Clubs or Organizations: Not on file  . Attends Archivist Meetings: Not on file  . Marital Status: Not on file  Intimate Partner Violence:   . Fear of Current or Ex-Partner: Not on file  . Emotionally Abused: Not on file  . Physically Abused: Not on file  . Sexually Abused: Not on file    FAMILY HISTORY: Family History  Family history unknown: Yes    ALLERGIES:  has No Known Allergies.  MEDICATIONS:  Current Outpatient Medications  Medication Sig Dispense Refill  . atorvastatin (LIPITOR) 20  MG tablet Take 20 mg by mouth daily.    Marland Kitchen lidocaine-prilocaine (EMLA) cream Apply 1 application topically as needed. 30 g 4  . lisinopril (PRINIVIL,ZESTRIL) 20 MG tablet Take 20 mg by mouth daily.    . Multiple Vitamin (MULTIVITAMIN WITH MINERALS) TABS tablet Take 1 tablet by mouth daily.    . Omega-3 Fatty Acids (FISH OIL) 1000 MG CAPS Take 1,000 mg by mouth daily.     . pantoprazole (PROTONIX) 40 MG tablet Take 40 mg by mouth daily.    . prochlorperazine (COMPAZINE) 10 MG tablet Take 1 tablet (10 mg total) by mouth every 6 (six) hours as needed for nausea or vomiting. 30 tablet 4  . Selenium (SELENIMIN PO) Take by mouth.    . Turmeric 500 MG CAPS Take 500 mg by mouth daily.    . ondansetron (ZOFRAN) 8 MG tablet Take 1 tablet (8 mg total) by mouth every 8 (eight) hours as needed for nausea or vomiting. (Patient not taking: Reported on 04/03/2019) 20 tablet 4   No current facility-administered medications for this visit.   Facility-Administered Medications Ordered in Other Visits  Medication Dose Route Frequency Provider Last Rate Last Admin  . sodium chloride flush (NS) 0.9 % injection 10 mL  10 mL Intravenous PRN Cammie Sickle, MD   10 mL at 04/03/19 0859      .  PHYSICAL EXAMINATION: ECOG PERFORMANCE STATUS: 1 - Symptomatic but completely ambulatory  Vitals:   04/03/19 0917  BP: 114/69  Pulse: 72  Resp: 20  Temp: 98.1 F (36.7 C)   Filed Weights   04/03/19 0917  Weight: 294 lb (133.4 kg)    Physical Exam  Constitutional: He is oriented to person, place, and time and well-developed, well-nourished, and in no distress.  HENT:  Head: Normocephalic and atraumatic.  Mouth/Throat: Oropharynx is clear and moist. No oropharyngeal exudate.  Eyes: Pupils are equal, round, and reactive to light.  Cardiovascular: Normal rate and regular rhythm.  Pulmonary/Chest: Effort normal and breath sounds normal. No respiratory distress. He has no wheezes.  Abdominal: Soft. Bowel  sounds are normal. He exhibits no distension and no mass. There is no abdominal tenderness. There is no rebound and no guarding.  Musculoskeletal:        General: No tenderness or edema. Normal range of motion.     Cervical back: Normal range of motion and neck supple.  Neurological: He is alert and oriented to person, place, and time.  Skin: Skin is warm.  Psychiatric: Affect normal.     LABORATORY DATA:  I have reviewed the data as listed Lab Results  Component Value Date   WBC 7.0 04/03/2019   HGB 13.1 04/03/2019   HCT 39.1 04/03/2019   MCV 93.5 04/03/2019   PLT 146 (L) 04/03/2019   Recent Labs    03/06/19 0822 03/20/19 0820 04/03/19 0848  NA 138 139 135  K 4.0  4.1 4.1  CL 106 106 103  CO2 _0 GLUCOSE 169* 188* 159*  BUN _1 CREATININE 0.69 0.93 0.96  CALCIUM 8.9 9.0 8.9  GFRNONAA >60 >60 >60  GFRAA >60 >60 >60  PROT 7.1 7.5 7.2  ALBUMIN 3.8 3.5 3.8  AST _2 ALT _3 ALKPHOS 127* 125 117  BILITOT 1.2 1.0 1.2    RADIOGRAPHIC STUDIES: I have personally reviewed the radiological images as listed and agreed with the findings in the report. No results found.  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  Currently on 5FU CIV [dced Ox after 12 cycles].   DEC 11th 2020-CT scan- 7 mm retroperitoneal lymph node improved from 10 mm previous. STABLE. Again  Discussed re: EGD- in Feb 2021 to assess the disease status in next month or so.   # Proceed with 5-FU CIV; Labs today reviewed;  acceptable for treatment today.  We will again repeat imaging sometime in mid March 2021.  # Left renal colic; UTI-s/p anti- Thrombocytopenia-platelets 170s; improved;anti- biotics- improved.  # PN-1-2- cold induced- sec to Oxaliplatin. STABLE.    # Constipation- G-1- ? Anti-emetics-stable.   # DISPOSITION: # 5FU  chemo; Treatment today; pump off in 2 days.  # follow up in 2 weeks- MD; labs- cbc/cmp- 5FU IV pump; pump of in 2 days; -Dr.B  Cc; Dr.Toledo/Skulskie   All questions were answered. The patient knows to call the clinic with any problems, questions or concerns.    Cammie Sickle, MD 04/03/2019 9:54 AM

## 2019-04-05 ENCOUNTER — Inpatient Hospital Stay: Payer: No Typology Code available for payment source

## 2019-04-05 ENCOUNTER — Other Ambulatory Visit: Payer: Self-pay

## 2019-04-05 DIAGNOSIS — Z5111 Encounter for antineoplastic chemotherapy: Secondary | ICD-10-CM | POA: Diagnosis not present

## 2019-04-05 DIAGNOSIS — Z7189 Other specified counseling: Secondary | ICD-10-CM

## 2019-04-05 DIAGNOSIS — C155 Malignant neoplasm of lower third of esophagus: Secondary | ICD-10-CM

## 2019-04-05 MED ORDER — SODIUM CHLORIDE 0.9% FLUSH
10.0000 mL | INTRAVENOUS | Status: DC | PRN
  Administered 2019-04-05: 13:00:00 10 mL
  Filled 2019-04-05: qty 10

## 2019-04-05 MED ORDER — HEPARIN SOD (PORK) LOCK FLUSH 100 UNIT/ML IV SOLN
INTRAVENOUS | Status: AC
  Filled 2019-04-05: qty 5

## 2019-04-05 MED ORDER — HEPARIN SOD (PORK) LOCK FLUSH 100 UNIT/ML IV SOLN
500.0000 [IU] | Freq: Once | INTRAVENOUS | Status: AC | PRN
  Administered 2019-04-05: 500 [IU]
  Filled 2019-04-05: qty 5

## 2019-04-12 ENCOUNTER — Other Ambulatory Visit: Payer: Self-pay

## 2019-04-12 DIAGNOSIS — N200 Calculus of kidney: Secondary | ICD-10-CM

## 2019-04-13 ENCOUNTER — Other Ambulatory Visit: Payer: Self-pay

## 2019-04-13 ENCOUNTER — Other Ambulatory Visit: Payer: No Typology Code available for payment source

## 2019-04-13 DIAGNOSIS — N200 Calculus of kidney: Secondary | ICD-10-CM

## 2019-04-13 LAB — URINALYSIS, COMPLETE
Bilirubin, UA: NEGATIVE
Ketones, UA: NEGATIVE
Nitrite, UA: POSITIVE — AB
Protein,UA: NEGATIVE
Specific Gravity, UA: 1.02 (ref 1.005–1.030)
Urobilinogen, Ur: 0.2 mg/dL (ref 0.2–1.0)
pH, UA: 5.5 (ref 5.0–7.5)

## 2019-04-13 LAB — MICROSCOPIC EXAMINATION: WBC, UA: >30 /hpf — AB (ref 0–5)

## 2019-04-16 LAB — CULTURE, URINE COMPREHENSIVE

## 2019-04-17 ENCOUNTER — Other Ambulatory Visit: Payer: Self-pay

## 2019-04-17 ENCOUNTER — Encounter: Payer: Self-pay | Admitting: Internal Medicine

## 2019-04-17 ENCOUNTER — Inpatient Hospital Stay (HOSPITAL_BASED_OUTPATIENT_CLINIC_OR_DEPARTMENT_OTHER): Payer: No Typology Code available for payment source | Admitting: Internal Medicine

## 2019-04-17 ENCOUNTER — Telehealth: Payer: Self-pay | Admitting: Urology

## 2019-04-17 ENCOUNTER — Inpatient Hospital Stay: Payer: No Typology Code available for payment source | Attending: Internal Medicine

## 2019-04-17 ENCOUNTER — Inpatient Hospital Stay: Payer: No Typology Code available for payment source

## 2019-04-17 DIAGNOSIS — Z5111 Encounter for antineoplastic chemotherapy: Secondary | ICD-10-CM | POA: Insufficient documentation

## 2019-04-17 DIAGNOSIS — C772 Secondary and unspecified malignant neoplasm of intra-abdominal lymph nodes: Secondary | ICD-10-CM | POA: Diagnosis not present

## 2019-04-17 DIAGNOSIS — B1920 Unspecified viral hepatitis C without hepatic coma: Secondary | ICD-10-CM | POA: Diagnosis not present

## 2019-04-17 DIAGNOSIS — C155 Malignant neoplasm of lower third of esophagus: Secondary | ICD-10-CM

## 2019-04-17 DIAGNOSIS — R5383 Other fatigue: Secondary | ICD-10-CM | POA: Insufficient documentation

## 2019-04-17 DIAGNOSIS — G4730 Sleep apnea, unspecified: Secondary | ICD-10-CM | POA: Diagnosis not present

## 2019-04-17 DIAGNOSIS — Z7189 Other specified counseling: Secondary | ICD-10-CM

## 2019-04-17 DIAGNOSIS — Z79899 Other long term (current) drug therapy: Secondary | ICD-10-CM | POA: Diagnosis not present

## 2019-04-17 DIAGNOSIS — E7800 Pure hypercholesterolemia, unspecified: Secondary | ICD-10-CM | POA: Insufficient documentation

## 2019-04-17 DIAGNOSIS — E119 Type 2 diabetes mellitus without complications: Secondary | ICD-10-CM | POA: Diagnosis not present

## 2019-04-17 DIAGNOSIS — R5381 Other malaise: Secondary | ICD-10-CM | POA: Diagnosis not present

## 2019-04-17 DIAGNOSIS — Z95828 Presence of other vascular implants and grafts: Secondary | ICD-10-CM

## 2019-04-17 DIAGNOSIS — I10 Essential (primary) hypertension: Secondary | ICD-10-CM | POA: Diagnosis not present

## 2019-04-17 LAB — COMPREHENSIVE METABOLIC PANEL
ALT: 17 U/L (ref 0–44)
AST: 23 U/L (ref 15–41)
Albumin: 3.7 g/dL (ref 3.5–5.0)
Alkaline Phosphatase: 105 U/L (ref 38–126)
Anion gap: 8 (ref 5–15)
BUN: 16 mg/dL (ref 6–20)
CO2: 24 mmol/L (ref 22–32)
Calcium: 8.7 mg/dL — ABNORMAL LOW (ref 8.9–10.3)
Chloride: 103 mmol/L (ref 98–111)
Creatinine, Ser: 0.88 mg/dL (ref 0.61–1.24)
GFR calc Af Amer: >60 mL/min (ref >60)
GFR calc non Af Amer: >60 mL/min (ref >60)
Glucose, Bld: 127 mg/dL — ABNORMAL HIGH (ref 70–99)
Potassium: 3.8 mmol/L (ref 3.5–5.1)
Sodium: 135 mmol/L (ref 135–145)
Total Bilirubin: 1 mg/dL (ref 0.3–1.2)
Total Protein: 7 g/dL (ref 6.5–8.1)

## 2019-04-17 LAB — CBC WITH DIFFERENTIAL/PLATELET
Abs Immature Granulocytes: 0.02 10*3/uL (ref 0.00–0.07)
Basophils Absolute: 0 10*3/uL (ref 0.0–0.1)
Basophils Relative: 0 %
Eosinophils Absolute: 0.3 10*3/uL (ref 0.0–0.5)
Eosinophils Relative: 5 %
HCT: 35.7 % — ABNORMAL LOW (ref 39.0–52.0)
Hemoglobin: 12.1 g/dL — ABNORMAL LOW (ref 13.0–17.0)
Immature Granulocytes: 0 %
Lymphocytes Relative: 20 %
Lymphs Abs: 1.4 10*3/uL (ref 0.7–4.0)
MCH: 31.8 pg (ref 26.0–34.0)
MCHC: 33.9 g/dL (ref 30.0–36.0)
MCV: 93.7 fL (ref 80.0–100.0)
Monocytes Absolute: 1 10*3/uL (ref 0.1–1.0)
Monocytes Relative: 14 %
Neutro Abs: 4.2 10*3/uL (ref 1.7–7.7)
Neutrophils Relative %: 61 %
Platelets: 136 10*3/uL — ABNORMAL LOW (ref 150–400)
RBC: 3.81 MIL/uL — ABNORMAL LOW (ref 4.22–5.81)
RDW: 13.1 % (ref 11.5–15.5)
WBC: 6.9 10*3/uL (ref 4.0–10.5)
nRBC: 0 % (ref 0.0–0.2)

## 2019-04-17 MED ORDER — DEXAMETHASONE SODIUM PHOSPHATE 10 MG/ML IJ SOLN
10.0000 mg | Freq: Once | INTRAMUSCULAR | Status: AC
  Administered 2019-04-17: 10 mg via INTRAVENOUS
  Filled 2019-04-17: qty 1

## 2019-04-17 MED ORDER — SULFAMETHOXAZOLE-TRIMETHOPRIM 800-160 MG PO TABS: 1.0000 | ORAL_TABLET | Freq: Two times a day (BID) | ORAL | 0 refills | Status: AC

## 2019-04-17 MED ORDER — SODIUM CHLORIDE 0.9 % IV SOLN
2400.0000 mg/m2 | INTRAVENOUS | Status: DC
  Administered 2019-04-17: 6450 mg via INTRAVENOUS
  Filled 2019-04-17: qty 129

## 2019-04-17 MED ORDER — SODIUM CHLORIDE 0.9% FLUSH
10.0000 mL | Freq: Once | INTRAVENOUS | Status: AC
  Administered 2019-04-17: 10:00:00 10 mL via INTRAVENOUS
  Filled 2019-04-17: qty 10

## 2019-04-17 MED ORDER — SODIUM CHLORIDE 0.9% FLUSH
10.0000 mL | INTRAVENOUS | Status: DC | PRN
  Administered 2019-04-17: 11:00:00 10 mL
  Filled 2019-04-17: qty 10

## 2019-04-17 NOTE — Telephone Encounter (Signed)
A little burning and some milky urine.

## 2019-04-17 NOTE — Telephone Encounter (Signed)
Rx Septra sent to pharmacy.  Will treat with a longer course to cover possible bacterial prostatitis.  Recommend repeat UA a few days before the antibiotic course is complete.

## 2019-04-17 NOTE — Addendum Note (Signed)
Addended by: Abbie Sons on: 04/17/2019 06:38 PM   Modules accepted: Orders

## 2019-04-17 NOTE — Assessment & Plan Note (Addendum)
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  Currently on 5FU CIV [dced Ox after 12 cycles].   DEC 11th 2020-CT scan- 7 mm retroperitoneal lymph node improved from 10 mm previous. STABLE;  Again  discussed re: EGD- in Feb 2021- awaiting appt with GI this week for repeat EGD.   # Proceed with 5-FU CIV; Labs today reviewed;  acceptable for treatment today.  Will order imaging at next visit.   # Left renal colic/UTI -recent evaluation with urology.  Awaiting repeat UA with Urology. Sent a message for Dr.Stoioff.   # PN-1-2- cold induced- sec to Oxaliplatin. Stable.   # DISPOSITION: # 5FU chemo; Treatment today; pump off in 2 days.  # follow up in 2 weeks- MD; labs- cbc/cmp- 5FU IV pump; pump of in 2 days; -Dr.B  Cc; Dr.Toledo/Skulskie

## 2019-04-17 NOTE — Progress Notes (Signed)
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  # June rd week- 5FU pump every 2 days; +RT [until aug 4th]  # 10/16/2018-FOLFOX #1; 9/21-PET- PR. 11/28/2018- cycle #6-decreased the ox dose to 60 mg/2 [sec to platelets- 94]. S/p 12 cycles of FOLFOX; DEC 15th 2020- switched to 5 CIV ONLY; dced Oxaliplatin.   # DM- diet controlled; Hepatitis C; September 2020-right kidney stone/spontaneous; foreign body impaction/spontaneous  # NGS/omniseq- PDL-1 CPS 20%;EGFR-copy number gain; NEG- targets/ Her-2 neu.   DIAGNOSIS: Adenocarcinoma esophagus/GE junction  STAGE: IV     ;GOALS: pallaitive  CURRENT/MOST RECENT THERAPY: 5FU  CIV q 2 weeks [C]      Malignant neoplasm of lower third of esophagus ([place])  08/30/2018 -  Chemotherapy   The patient had palonosetron (ALOXI) injection 0.25 mg, 0.25 mg, Intravenous,  Once, 9 of 9 cycles Administration: 0.25 mg (10/30/2018), 0.25 mg (10/16/2018), 0.25 mg (11/28/2018), 0.25 mg (12/12/2018), 0.25 mg (11/14/2018), 0.25 mg (12/26/2018), 0.25 mg (01/09/2019), 0.25 mg (01/23/2019), 0.25 mg (02/06/2019) leucovorin 1,072 mg in  dextrose 5 % 250 mL infusion, 400 mg/m2 = 1,072 mg, Intravenous,  Once, 11 of 11 cycles Administration: 1,000 mg (10/30/2018), 1,000 mg (10/16/2018), 1,000 mg (11/28/2018), 1,000 mg (12/12/2018), 1,000 mg (12/26/2018), 1,000 mg (01/09/2019), 1,000 mg (01/23/2019), 1,000 mg (02/06/2019) oxaliplatin (ELOXATIN) 230 mg in dextrose 5 % 500 mL chemo infusion, 85 mg/m2 = 230 mg, Intravenous,  Once, 9 of 9 cycles Dose modification: 60 mg/m2 (original dose 85 mg/m2, Cycle 7, Reason: Provider Judgment) Administration: 230 mg (10/30/2018), 230 mg (10/16/2018), 160 mg (11/28/2018), 160 mg (12/12/2018), 230 mg (11/14/2018), 160 mg (12/26/2018), 160 mg (01/09/2019), 160 mg (01/23/2019), 160 mg (02/06/2019) leucovorin injection 54 mg, 20 mg/m2 = 54 mg (100 % of original dose 20 mg/m2), Intravenous,  Once, 8 of 8 cycles Dose modification: 20 mg/m2 (original dose 20 mg/m2, Cycle 2, Reason: Other (see comments), Comment: iv push), 20 mg/m2 (original dose 20 mg/m2, Cycle 3, Reason: Other (see comments), Comment: ivp) Administration: 54 mg (09/13/2018), 54 mg (10/02/2018) fluorouracil (ADRUCIL) 6,450 mg in sodium chloride 0.9 % 121 mL chemo infusion, 2,400 mg/m2 = 6,450 mg, Intravenous, 1 Day/Dose, 17 of 20 cycles Administration: 6,450 mg (08/30/2018), 6,450 mg (09/13/2018), 6,450 mg (10/30/2018), 6,450 mg (10/02/2018), 6,450 mg (10/16/2018), 6,450 mg (11/28/2018), 6,450 mg (12/12/2018), 6,450 mg (11/14/2018), 6,450 mg (12/26/2018), 6,450 mg (01/09/2019), 6,450 mg (01/23/2019), 6,450 mg (02/06/2019), 6,450 mg (02/20/2019), 6,450 mg (03/06/2019), 6,450 mg (03/20/2019), 6,450 mg (04/03/2019), 6,450 mg (04/17/2019)  for chemotherapy treatment.      HISTORY OF PRESENTING ILLNESS:  Matthew Yang 59 y.o.  male stage IV adenocarcinoma esophagus currently on 5-FU CIV chemotherapy is here.   Patient has no fevers or chills.  Denies any difficulty swallowing.  He is awaiting repeat evaluation  with GI for possible upcoming endoscopy.  Chronic mild tingling and  numbness but not any worse.  No skin rash.  Noted mild dysuria/increased frequency of urination-for which he had a urine analysis done.  Awaiting recommendation of urology.    Review of Systems  Constitutional: Positive for malaise/fatigue. Negative for chills, diaphoresis and fever.  HENT: Negative for nosebleeds and sore throat.   Eyes: Negative for double vision.  Respiratory: Negative for cough, hemoptysis, sputum production, shortness of breath and wheezing.   Cardiovascular: Negative for chest pain, palpitations, orthopnea and leg swelling.  Gastrointestinal: Negative for abdominal pain, blood in stool, diarrhea, heartburn, melena and nausea.  Genitourinary: Positive for dysuria and frequency.  Musculoskeletal: Negative for back pain and joint pain.  Skin: Negative.  Negative for itching and rash.  Neurological: Positive for tingling. Negative for dizziness, focal weakness, weakness and headaches.  Endo/Heme/Allergies: Does not bruise/bleed easily.  Psychiatric/Behavioral: Negative for depression. The patient is not nervous/anxious and does not have insomnia.      MEDICAL HISTORY:  Past Medical History:  Diagnosis Date  . Diabetes mellitus without complication ([place])   . Esophagus cancer ([place])   . Hepatitis    HEPATITIS C  . Hypercholesteremia   . Hypertension   . Morbid obesity ([place])   . Positive hepatitis C antibody test   . Sleep apnea   . Tubular adenoma of colon    polyp    SURGICAL HISTORY: Past Surgical History:  Procedure Laterality Date  . CHOLECYSTECTOMY    . COLONOSCOPY    . COLONOSCOPY WITH PROPOFOL N/A 05/04/2016   Procedure: COLONOSCOPY WITH PROPOFOL;  Surgeon: Lollie Sails, MD;  Location: [facility] ENDOSCOPY;  Service: Endoscopy;  Laterality: N/A;  . ESOPHAGOGASTRODUODENOSCOPY (EGD) WITH PROPOFOL N/A 08/04/2018   Procedure: ESOPHAGOGASTRODUODENOSCOPY (EGD) WITH PROPOFOL;  Surgeon: Lollie Sails, MD;  Location: [facility] ENDOSCOPY;  Service: Endoscopy;   Laterality: N/A;  . PORTA CATH INSERTION N/A 08/17/2018   Procedure: PORTA CATH INSERTION;  Surgeon: Algernon Huxley, MD;  Location: [place] CV LAB;  Service: Cardiovascular;  Laterality: N/A;    SOCIAL HISTORY: Social History   Socioeconomic History  . Marital status: Married    Spouse name: Not on file  . Number of children: Not on file  . Years of education: Not on file  . Highest education level: Not on file  Occupational History  . Not on file  Tobacco Use  . Smoking status: Never Smoker  . Smokeless tobacco: Never Used  Substance and Sexual Activity  . Alcohol use: Not Currently  . Drug use: No  . Sexual activity: Not on file  Other Topics Concern  . 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    Social Determinants of Health   Financial Resource Strain:   . Difficulty of Paying Living Expenses: Not on file  Food Insecurity:   . Worried About Charity fundraiser in the Last Year: Not on file  . Ran Out of Food in the Last Year: Not on file  Transportation Needs:   . Lack of Transportation (Medical): Not on file  . Lack of Transportation (Non-Medical): Not on file  Physical Activity:   . Days of Exercise per Week: Not on file  . Minutes of Exercise per Session: Not on file  Stress:   . Feeling of Stress : Not on file  Social Connections:   . Frequency of Communication  with Friends and Family: Not on file  . Frequency of Social Gatherings with Friends and Family: Not on file  . Attends Religious Services: Not on file  . Active Member of Clubs or Organizations: Not on file  . Attends Archivist Meetings: Not on file  . Marital Status: Not on file  Intimate Partner Violence:   . Fear of Current or Ex-Partner: Not on file  . Emotionally Abused: Not on file  . Physically Abused: Not on file  . Sexually Abused: Not on file    FAMILY HISTORY: Family History  Family history  unknown: Yes    ALLERGIES:  has No Known Allergies.  MEDICATIONS:  Current Outpatient Medications  Medication Sig Dispense Refill  . atorvastatin (LIPITOR) 20 MG tablet Take 20 mg by mouth daily.    Marland Kitchen lidocaine-prilocaine (EMLA) cream Apply 1 application topically as needed. 30 g 4  . lisinopril (PRINIVIL,ZESTRIL) 20 MG tablet Take 20 mg by mouth daily.    . Multiple Vitamin (MULTIVITAMIN WITH MINERALS) TABS tablet Take 1 tablet by mouth daily.    . Omega-3 Fatty Acids (FISH OIL) 1000 MG CAPS Take 1,000 mg by mouth daily.     . ondansetron (ZOFRAN) 8 MG tablet Take 1 tablet (8 mg total) by mouth every 8 (eight) hours as needed for nausea or vomiting. (Patient not taking: Reported on 04/03/2019) 20 tablet 4  . pantoprazole (PROTONIX) 40 MG tablet Take 40 mg by mouth daily.    . prochlorperazine (COMPAZINE) 10 MG tablet Take 1 tablet (10 mg total) by mouth every 6 (six) hours as needed for nausea or vomiting. 30 tablet 4  . Selenium (SELENIMIN PO) Take by mouth.    . Turmeric 500 MG CAPS Take 500 mg by mouth daily.     No current facility-administered medications for this visit.   Facility-Administered Medications Ordered in Other Visits  Medication Dose Route Frequency Provider Last Rate Last Admin  . fluorouracil (ADRUCIL) 6,450 mg in sodium chloride 0.9 % 121 mL chemo infusion  2,400 mg/m2 (Treatment Plan Recorded) Intravenous 1 day or 1 dose Cammie Sickle, MD   6,450 mg at 04/17/19 1134  . sodium chloride flush (NS) 0.9 % injection 10 mL  10 mL Intracatheter PRN Cammie Sickle, MD   10 mL at 04/17/19 1045      .  PHYSICAL EXAMINATION: ECOG PERFORMANCE STATUS: 1 - Symptomatic but completely ambulatory  Vitals:   04/17/19 1005  BP: (!) 125/96  Pulse: 79  Temp: (!) 97.2 F (36.2 C)   Filed Weights   04/17/19 1005  Weight: 299 lb (135.6 kg)    Physical Exam  Constitutional: He is oriented to person, place, and time and well-developed, well-nourished, and in  no distress.  HENT:  Head: Normocephalic and atraumatic.  Mouth/Throat: Oropharynx is clear and moist. No oropharyngeal exudate.  Eyes: Pupils are equal, round, and reactive to light.  Cardiovascular: Normal rate and regular rhythm.  Pulmonary/Chest: Effort normal and breath sounds normal. No respiratory distress. He has no wheezes.  Abdominal: Soft. Bowel sounds are normal. He exhibits no distension and no mass. There is no abdominal tenderness. There is no rebound and no guarding.  Musculoskeletal:        General: No tenderness or edema. Normal range of motion.     Cervical back: Normal range of motion and neck supple.  Neurological: He is alert and oriented to person, place, and time.  Skin: Skin is warm.  Psychiatric: Affect normal.  LABORATORY DATA:  I have reviewed the data as listed Lab Results  Component Value Date   WBC 6.9 04/17/2019   HGB 12.1 (L) 04/17/2019   HCT 35.7 (L) 04/17/2019   MCV 93.7 04/17/2019   PLT 136 (L) 04/17/2019   Recent Labs    03/20/19 0820 04/03/19 0848 04/17/19 0948  NA 139 135 135  K 4.1 4.1 3.8  CL 106 103 103  CO2 _0 GLUCOSE 188* 159* 127*  BUN _1 CREATININE 0.93 0.96 0.88  CALCIUM 9.0 8.9 8.7*  GFRNONAA >60 >60 >60  GFRAA >60 >60 >60  PROT 7.5 7.2 7.0  ALBUMIN 3.5 3.8 3.7  AST _2 ALT _3 ALKPHOS 125 117 105  BILITOT 1.0 1.2 1.0    RADIOGRAPHIC STUDIES: I have personally reviewed the radiological images as listed and agreed with the findings in the report. No results found.  ASSESSMENT & PLAN:   Malignant neoplasm of lower third of esophagus ([place]) #Adenocarcinoma [poorly differentiated/question signet ring morphology] lower third of esophagus/GE junction. Stage IV.  Currently on 5FU CIV [dced Ox after 12 cycles].   DEC 11th 2020-CT scan- 7 mm retroperitoneal lymph node improved from 10 mm previous. STABLE;  Again  discussed re: EGD- in Feb 2021- awaiting appt with GI this week for repeat EGD.    # Proceed with 5-FU CIV; Labs today reviewed;  acceptable for treatment today.  Will order imaging at next visit.   # Left renal colic/UTI -recent evaluation with urology.  Awaiting repeat UA with Urology. Sent a message for Dr.Stoioff.   # PN-1-2- cold induced- sec to Oxaliplatin. Stable.   # DISPOSITION: # 5FU chemo; Treatment today; pump off in 2 days.  # follow up in 2 weeks- MD; labs- cbc/cmp- 5FU IV pump; pump of in 2 days; -Dr.B  Cc; Dr.[place]/Skulskie   All questions were answered. The patient knows to call the clinic with any problems, questions or concerns.    Cammie Sickle, MD 04/17/2019 12:27 PM

## 2019-04-17 NOTE — Telephone Encounter (Signed)
Still showing bacteria in his urine.  Is he having symptoms?

## 2019-04-18 NOTE — Telephone Encounter (Signed)
Notified patient as instructed, patient pleased. Discussed follow-up appointments, patient agrees  lab [facility]

## 2019-04-19 ENCOUNTER — Other Ambulatory Visit: Payer: Self-pay

## 2019-04-19 ENCOUNTER — Inpatient Hospital Stay: Payer: No Typology Code available for payment source

## 2019-04-19 VITALS — BP 116/77 | HR 66 | Temp 96.5°F | Resp 20

## 2019-04-19 DIAGNOSIS — Z5111 Encounter for antineoplastic chemotherapy: Secondary | ICD-10-CM | POA: Diagnosis not present

## 2019-04-19 DIAGNOSIS — Z7189 Other specified counseling: Secondary | ICD-10-CM

## 2019-04-19 DIAGNOSIS — C155 Malignant neoplasm of lower third of esophagus: Secondary | ICD-10-CM

## 2019-04-19 MED ORDER — HEPARIN SOD (PORK) LOCK FLUSH 100 UNIT/ML IV SOLN
INTRAVENOUS | Status: AC
  Filled 2019-04-19: qty 5

## 2019-04-19 MED ORDER — HEPARIN SOD (PORK) LOCK FLUSH 100 UNIT/ML IV SOLN
500.0000 [IU] | Freq: Once | INTRAVENOUS | Status: AC | PRN
  Administered 2019-04-19: 500 [IU]
  Filled 2019-04-19: qty 5

## 2019-04-19 MED ORDER — SODIUM CHLORIDE 0.9% FLUSH
10.0000 mL | INTRAVENOUS | Status: DC | PRN
  Administered 2019-04-19: 10 mL
  Filled 2019-04-19: qty 10

## 2019-04-26 ENCOUNTER — Telehealth: Payer: Self-pay | Admitting: *Deleted

## 2019-04-26 NOTE — Telephone Encounter (Signed)
Patient called and spoke to [facility]. He has an upcoming procedure - EGD on the week of his next chemotherapy. Pt inquiring if he needs his pump on during egd. I spoke with Dr.Brahmanday. MD would like to r/s his next apt for 1 week out after his EGD. I left a vm to inform patient of the plan of care. I will also send a mychart msg to patient/pt's wife.  Colette- Please change pt's apts to week of March 2nd

## 2019-05-01 ENCOUNTER — Inpatient Hospital Stay: Payer: No Typology Code available for payment source | Admitting: Internal Medicine

## 2019-05-01 ENCOUNTER — Inpatient Hospital Stay: Payer: No Typology Code available for payment source

## 2019-05-01 ENCOUNTER — Other Ambulatory Visit: Payer: No Typology Code available for payment source

## 2019-05-01 ENCOUNTER — Other Ambulatory Visit
Admission: RE | Admit: 2019-05-01 | Discharge: 2019-05-01 | Disposition: A | Discharge status: Home / Self Care | Payer: No Typology Code available for payment source | Source: Ambulatory Visit | Attending: Internal Medicine | Admitting: Internal Medicine

## 2019-05-01 ENCOUNTER — Other Ambulatory Visit: Payer: Self-pay

## 2019-05-01 DIAGNOSIS — Z20822 Contact with and (suspected) exposure to covid-19: Secondary | ICD-10-CM | POA: Insufficient documentation

## 2019-05-01 DIAGNOSIS — Z01812 Encounter for preprocedural laboratory examination: Secondary | ICD-10-CM | POA: Diagnosis not present

## 2019-05-01 DIAGNOSIS — N200 Calculus of kidney: Secondary | ICD-10-CM

## 2019-05-01 LAB — SARS CORONAVIRUS 2 (TAT 6-24 HRS): SARS Coronavirus 2: NEGATIVE

## 2019-05-02 ENCOUNTER — Encounter: Payer: Self-pay | Admitting: Internal Medicine

## 2019-05-02 LAB — URINALYSIS, COMPLETE
Bilirubin, UA: NEGATIVE
Ketones, UA: NEGATIVE
Leukocytes,UA: NEGATIVE
Nitrite, UA: NEGATIVE
Protein,UA: NEGATIVE
RBC, UA: NEGATIVE
Specific Gravity, UA: 1.025 (ref 1.005–1.030)
Urobilinogen, Ur: 0.2 mg/dL (ref 0.2–1.0)
pH, UA: 5 (ref 5.0–7.5)

## 2019-05-02 LAB — MICROSCOPIC EXAMINATION
Bacteria, UA: NONE SEEN
RBC, Urine: NONE SEEN /hpf (ref 0–2)

## 2019-05-03 ENCOUNTER — Inpatient Hospital Stay: Payer: No Typology Code available for payment source

## 2019-05-03 ENCOUNTER — Other Ambulatory Visit: Payer: Self-pay

## 2019-05-03 ENCOUNTER — Ambulatory Visit: Payer: No Typology Code available for payment source

## 2019-05-03 ENCOUNTER — Ambulatory Visit
Admission: RE | Admit: 2019-05-03 | Discharge: 2019-05-03 | Disposition: A | Discharge status: Home / Self Care | Payer: No Typology Code available for payment source | Attending: Internal Medicine | Admitting: Internal Medicine

## 2019-05-03 ENCOUNTER — Encounter: Payer: Self-pay | Admitting: Internal Medicine

## 2019-05-03 ENCOUNTER — Encounter
Admission: RE | Disposition: A | Discharge status: Home / Self Care | Payer: Self-pay | Source: Home / Self Care | Attending: Internal Medicine

## 2019-05-03 DIAGNOSIS — Z8501 Personal history of malignant neoplasm of esophagus: Secondary | ICD-10-CM | POA: Insufficient documentation

## 2019-05-03 DIAGNOSIS — E119 Type 2 diabetes mellitus without complications: Secondary | ICD-10-CM | POA: Insufficient documentation

## 2019-05-03 DIAGNOSIS — E7800 Pure hypercholesterolemia, unspecified: Secondary | ICD-10-CM | POA: Diagnosis not present

## 2019-05-03 DIAGNOSIS — Z6841 Body Mass Index (BMI) 40.0 and over, adult: Secondary | ICD-10-CM | POA: Diagnosis not present

## 2019-05-03 DIAGNOSIS — Z79899 Other long term (current) drug therapy: Secondary | ICD-10-CM | POA: Diagnosis not present

## 2019-05-03 DIAGNOSIS — G4730 Sleep apnea, unspecified: Secondary | ICD-10-CM | POA: Diagnosis not present

## 2019-05-03 DIAGNOSIS — K2210 Ulcer of esophagus without bleeding: Secondary | ICD-10-CM | POA: Diagnosis not present

## 2019-05-03 DIAGNOSIS — Z9221 Personal history of antineoplastic chemotherapy: Secondary | ICD-10-CM | POA: Insufficient documentation

## 2019-05-03 DIAGNOSIS — K2100 Gastro-esophageal reflux disease with esophagitis, without bleeding: Secondary | ICD-10-CM | POA: Insufficient documentation

## 2019-05-03 DIAGNOSIS — Z923 Personal history of irradiation: Secondary | ICD-10-CM | POA: Insufficient documentation

## 2019-05-03 DIAGNOSIS — I10 Essential (primary) hypertension: Secondary | ICD-10-CM | POA: Diagnosis not present

## 2019-05-03 DIAGNOSIS — K449 Diaphragmatic hernia without obstruction or gangrene: Secondary | ICD-10-CM | POA: Diagnosis not present

## 2019-05-03 DIAGNOSIS — Z86010 Personal history of colonic polyps: Secondary | ICD-10-CM | POA: Diagnosis not present

## 2019-05-03 DIAGNOSIS — Z08 Encounter for follow-up examination after completed treatment for malignant neoplasm: Secondary | ICD-10-CM | POA: Diagnosis not present

## 2019-05-03 DIAGNOSIS — K2970 Gastritis, unspecified, without bleeding: Secondary | ICD-10-CM | POA: Insufficient documentation

## 2019-05-03 HISTORY — PX: ESOPHAGOGASTRODUODENOSCOPY: SHX5428

## 2019-05-03 SURGERY — EGD (ESOPHAGOGASTRODUODENOSCOPY)
Anesthesia: General

## 2019-05-03 MED ORDER — SODIUM CHLORIDE 0.9 % IV SOLN: INTRAVENOUS | Status: DC

## 2019-05-03 MED ORDER — PROPOFOL 500 MG/50ML IV EMUL
INTRAVENOUS | Status: DC | PRN
  Administered 2019-05-03: 50 mg via INTRAVENOUS
  Administered 2019-05-03: 100 ug/kg/min via INTRAVENOUS
  Administered 2019-05-03: 50 mg via INTRAVENOUS

## 2019-05-03 MED ORDER — LIDOCAINE HCL (CARDIAC) PF 100 MG/5ML IV SOSY
PREFILLED_SYRINGE | INTRAVENOUS | Status: DC | PRN
  Administered 2019-05-03: 60 mg via INTRAVENOUS

## 2019-05-03 MED ORDER — LIDOCAINE HCL (PF) 2 % IJ SOLN
INTRAMUSCULAR | Status: AC
  Filled 2019-05-03: qty 10

## 2019-05-03 MED ORDER — PROPOFOL 10 MG/ML IV BOLUS
INTRAVENOUS | Status: AC
  Filled 2019-05-03: qty 20

## 2019-05-03 NOTE — H&P (Signed)
Outpatient short stay form Pre-procedure 05/03/2019 9:10 AM Julienne Vogler K. Alice Reichert, M.D.  Primary Physician: Frazier Richards, M.D.  Reason for visit:  Follow up esophageal carcinoma s/p chemoradiation  History of present illness: Patient is a pleasant 59 y/o male presenting for surveillance of known, poorly differentiated esophageal adenocarcinoma diagnosed in May 2020 by DR. Skulskie and is s/p chemoradiation therapy with DR. Brahmanday in oncology. Patient last PET scan could not discern any presence of residual mass lesion post therapy and EGD is scheduled to survey the area and get biopsies as appropriate. Patient denies intractable heartburn, dysphagia, hemetemesis, abdominal pain, nausea or vomiting.     Current Facility-Administered Medications:  .  0.9 %  sodium chloride infusion, , Intravenous, Continuous, [place], Benay Pike, MD, Last Rate: 20 mL/hr at 05/03/19 [id number], New Bag at 05/03/19 [id number]  Medications Prior to Admission  Medication Sig Dispense Refill Last Dose  . atorvastatin (LIPITOR) 20 MG tablet Take 20 mg by mouth daily.   05/03/2019 at 0630  . lisinopril (PRINIVIL,ZESTRIL) 20 MG tablet Take 20 mg by mouth daily.   05/03/2019 at 0630  . Multiple Vitamin (MULTIVITAMIN WITH MINERALS) TABS tablet Take 1 tablet by mouth daily.   Past Week at Unknown time  . Omega-3 Fatty Acids (FISH OIL) 1000 MG CAPS Take 1,000 mg by mouth daily.    Past Week at Unknown time  . pantoprazole (PROTONIX) 40 MG tablet Take 40 mg by mouth daily.   05/03/2019 at 0630  . Selenium (SELENIMIN PO) Take by mouth.   Past Week at Unknown time  . Turmeric 500 MG CAPS Take 500 mg by mouth daily.   Past Week at Unknown time  . lidocaine-prilocaine (EMLA) cream Apply 1 application topically as needed. 30 g 4   . ondansetron (ZOFRAN) 8 MG tablet Take 1 tablet (8 mg total) by mouth every 8 (eight) hours as needed for nausea or vomiting. (Patient not taking: Reported on 04/03/2019) 20 tablet 4   . prochlorperazine  (COMPAZINE) 10 MG tablet Take 1 tablet (10 mg total) by mouth every 6 (six) hours as needed for nausea or vomiting. 30 tablet 4   . sulfamethoxazole-trimethoprim (BACTRIM DS) 800-160 MG tablet Take 1 tablet by mouth every 12 (twelve) hours for 21 days. 42 tablet 0      No Known Allergies   Past Medical History:  Diagnosis Date  . Diabetes mellitus without complication ([place])   . Esophagus cancer ([place])   . Hepatitis    HEPATITIS C  . Hypercholesteremia   . Hypertension   . Morbid obesity ([place])   . Positive hepatitis C antibody test   . Sleep apnea   . Tubular adenoma of colon    polyp    Review of systems:  Otherwise negative.    Physical Exam  Gen: Alert, oriented. Appears stated age.  HEENT: [place]/AT. PERRLA. Lungs: CTA, no wheezes. CV: RR nl S1, S2. Abd: soft, benign, no masses. BS+ Ext: No edema. Pulses 2+    Planned procedures: Proceed with EGD. The patient understands the nature of the planned procedure, indications, risks, alternatives and potential complications including but not limited to bleeding, infection, perforation, damage to internal organs and possible oversedation/side effects from anesthesia. The patient agrees and gives consent to proceed.  Please refer to procedure notes for findings, recommendations and patient disposition/instructions.     Anush Wiedeman K. Alice Reichert, M.D. Gastroenterology 05/03/2019  9:10 AM

## 2019-05-03 NOTE — Anesthesia Preprocedure Evaluation (Addendum)
Anesthesia Evaluation  Patient identified by MRN, date of birth, ID band Patient awake    Reviewed: Allergy & Precautions, H&P , NPO status , Patient's Chart, lab work & pertinent test results  Airway Mallampati: III  TM Distance: >3 FB     Dental  (+) Chipped, Poor Dentition, Missing, Loose   Pulmonary sleep apnea and Continuous Positive Airway Pressure Ventilation ,           Cardiovascular hypertension,      Neuro/Psych negative neurological ROS  negative psych ROS   GI/Hepatic (+) Hepatitis -, CEsophageal cancer   Endo/Other  diabetesMorbid obesity  Renal/GU negative Renal ROS  negative genitourinary   Musculoskeletal   Abdominal   Peds  Hematology negative hematology ROS (+)   Anesthesia Other Findings Past Medical History: No date: Diabetes mellitus without complication ([facility]) No date: Esophagus cancer ([place]) No date: Hepatitis     Comment:  HEPATITIS C No date: Hypercholesteremia No date: Hypertension No date: Morbid obesity ([place]) No date: Positive hepatitis C antibody test No date: Sleep apnea No date: Tubular adenoma of colon     Comment:  polyp  Past Surgical History: No date: CHOLECYSTECTOMY No date: COLONOSCOPY 05/04/2016: COLONOSCOPY WITH PROPOFOL; N/A     Comment:  Procedure: COLONOSCOPY WITH PROPOFOL;  Surgeon: Lollie Sails, MD;  Location[facility] ENDOSCOPY;  Service:               Endoscopy;  Laterality: N/A; 08/04/2018: ESOPHAGOGASTRODUODENOSCOPY (EGD) WITH PROPOFOL; N/A     Comment:  Procedure: ESOPHAGOGASTRODUODENOSCOPY (EGD) WITH               PROPOFOL;  Surgeon: Lollie Sails, MD;  Location:               [facility] ENDOSCOPY;  Service: Endoscopy;  Laterality: N/A; 08/17/2018: PORTA CATH INSERTION; N/A     Comment:  Procedure: PORTA CATH INSERTION;  Surgeon: Algernon Huxley,              MD;  Location: [place] CV LAB;  Service:               Cardiovascular;  Laterality:  N/A;  BMI    Body Mass Index: 41.86 kg/m      Reproductive/Obstetrics negative OB ROS                            Anesthesia Physical Anesthesia Plan  ASA: III  Anesthesia Plan: General   Post-op Pain Management:    Induction:   PONV Risk Score and Plan: Propofol infusion and TIVA  Airway Management Planned:   Additional Equipment:   Intra-op Plan:   Post-operative Plan:   Informed Consent: I have reviewed the patients History and Physical, chart, labs and discussed the procedure including the risks, benefits and alternatives for the proposed anesthesia with the patient or authorized representative who has indicated his/her understanding and acceptance.     Dental Advisory Given  Plan Discussed with: Anesthesiologist  Anesthesia Plan Comments:         Anesthesia Quick Evaluation

## 2019-05-03 NOTE — Interval H&P Note (Signed)
History and Physical Interval Note:  05/03/2019 9:28 AM  Matthew Yang  has presented today for surgery, with the diagnosis of ESOPHAGEAL ADENOCARCINOMA.  The various methods of treatment have been discussed with the patient and family. After consideration of risks, benefits and other options for treatment, the patient has consented to  Procedure(s): ESOPHAGOGASTRODUODENOSCOPY (EGD) (N/A) as a surgical intervention.  The patient's history has been reviewed, patient examined, no change in status, stable for surgery.  I have reviewed the patient's chart and labs.  Questions were answered to the patient's satisfaction.     [name], [name]

## 2019-05-03 NOTE — Transfer of Care (Signed)
Immediate Anesthesia Transfer of Care Note  Patient: Tran Kimak  Procedure(s) Performed: ESOPHAGOGASTRODUODENOSCOPY (EGD) (N/A )  Patient Location: PACU  Anesthesia Type:General  Level of Consciousness: awake, alert  and oriented  Airway & Oxygen Therapy: Patient Spontanous Breathing    Post-op Assessment: VSS Post vital signs: Reviewed and stable  Last Vitals:  Vitals Value Taken Time  BP 94/49 05/03/19 0953  Temp 36.6 C 05/03/19 0949  Pulse 80 05/03/19 0954  Resp 20 05/03/19 0954  SpO2 98 % 05/03/19 0954  Vitals shown include unvalidated device data.  Last Pain:  Vitals:   05/03/19 0949  TempSrc: Temporal  PainSc: 0-No pain         Complications: No apparent anesthesia complications

## 2019-05-03 NOTE — Op Note (Signed)
[facility] Gastroenterology Patient Name: Matthew Yang Procedure Date: 05/03/2019 9:22 AM MRN: [id number] Account #: [id number] Date of Birth: 03/27/1960 Admit Type: Outpatient Age: 59 Room: [facility] ENDO ROOM 3 Gender: Male Note Status: Finalized Procedure:             Upper GI endoscopy Indications:           Follow-up of malignant esophageal adenocarcinoma,                         Patient s/p chemoradiation in 2020. Providers:             Benay Pike. Alice Reichert MD, MD Referring MD:          Ocie Cornfield. Ouida Sills MD, MD (Referring MD) Complications:         No immediate complications. Estimated blood loss:                         Minimal. Procedure:             Pre-Anesthesia Assessment:                        - The risks and benefits of the procedure and the                         sedation options and risks were discussed with the                         patient. All questions were answered and informed                         consent was obtained.                        - Patient identification and proposed procedure were                         verified prior to the procedure by the nurse. The                         procedure was verified in the procedure room.                        - ASA Grade Assessment: II - A patient with mild                         systemic disease.                        - After reviewing the risks and benefits, the patient                         was deemed in satisfactory condition to undergo the                         procedure.                        After obtaining informed consent, the endoscope was  passed under direct vision. Throughout the procedure,                         the patient's blood pressure, pulse, and oxygen                         saturations were monitored continuously. The Endoscope                         was introduced through the mouth, and advanced to the                         third part  of duodenum. The upper GI endoscopy was                         accomplished without difficulty. The patient tolerated                         the procedure well. Findings:      Nodular, erosive esophagitis with no bleeding was found in the distal       esophagus. Biopsies were taken with a cold forceps for histology.      The area in the distal esophagus measured 2cm prior to reaching the       Gastroesophageal junction.      There is no endoscopic evidence of stenosis, stricture, ulcerations or       mass in the entire esophagus.      A 1 cm hiatal hernia was present.      Localized moderate inflammation characterized by congestion (edema) and       erythema was found in the cardia. Biopsies were taken with a cold       forceps for histology.      Patchy mild inflammation characterized by erosions was found in the       prepyloric region of the stomach. Biopsies were taken with a cold       forceps for Helicobacter pylori testing.      The examined duodenum was normal.      The exam was otherwise without abnormality. Impression:            - Nodular, erosive esophagitis with no bleeding.                         Biopsied.                        - 1 cm hiatal hernia.                        - Gastritis. Biopsied.                        - Gastritis. Biopsied.                        - Normal examined duodenum.                        - The examination was otherwise normal. Recommendation:        - Patient has a contact number available for  emergencies. The signs and symptoms of potential                         delayed complications were discussed with the patient.                         Return to normal activities tomorrow. Written                         discharge instructions were provided to the patient.                        - Resume previous diet.                        - Continue present medications.                        - Await pathology results.                         - Repeat upper endoscopy after studies are complete                         for surveillance.                        - Consider Endoscopic ultrasound for further                         esophageal carcinoma surveillance pending biopsy                         results.                        - Return to my office PRN.                        - Follow up with Dr. Rogue Bussing (Oncology) as                         scheduled.                        - The findings and recommendations were discussed with                         the patient. Procedure Code(s):     --- Professional ---                        [id number], Esophagogastroduodenoscopy, flexible,                         transoral; with biopsy, single or multiple Diagnosis Code(s):     --- Professional ---                        C15.9, Malignant neoplasm of esophagus, unspecified                        K29.70, Gastritis, unspecified, without bleeding  K44.9, Diaphragmatic hernia without obstruction or                         gangrene                        K20.90, Esophagitis, unspecified without bleeding CPT copyright 2019 American Medical Association. All rights reserved. The codes documented in this report are preliminary and upon coder review may  be revised to meet current compliance requirements. Efrain Sella MD, MD 05/03/2019 9:55:30 AM This report has been signed electronically. Number of Addenda: 0 Note Initiated On: 05/03/2019 9:22 AM Estimated Blood Loss:  Estimated blood loss was minimal.      [facility]

## 2019-05-04 ENCOUNTER — Encounter: Payer: Self-pay | Admitting: *Deleted

## 2019-05-04 LAB — CULTURE, URINE COMPREHENSIVE

## 2019-05-07 ENCOUNTER — Telehealth: Payer: Self-pay | Admitting: *Deleted

## 2019-05-07 NOTE — Telephone Encounter (Signed)
-----   Message from Abbie Sons, MD sent at 05/06/2019 12:52 PM EST ----- Repeat UA clear and urine culture negative.  Follow-up visit 6 months

## 2019-05-07 NOTE — Telephone Encounter (Signed)
Notified patient as instructed, patient pleased. Discussed follow-up appointments, patient agrees  

## 2019-05-08 ENCOUNTER — Inpatient Hospital Stay (HOSPITAL_BASED_OUTPATIENT_CLINIC_OR_DEPARTMENT_OTHER): Payer: No Typology Code available for payment source | Admitting: Internal Medicine

## 2019-05-08 ENCOUNTER — Other Ambulatory Visit: Payer: Self-pay

## 2019-05-08 ENCOUNTER — Encounter: Payer: Self-pay | Admitting: Internal Medicine

## 2019-05-08 ENCOUNTER — Inpatient Hospital Stay: Payer: No Typology Code available for payment source | Attending: Internal Medicine

## 2019-05-08 ENCOUNTER — Inpatient Hospital Stay: Payer: No Typology Code available for payment source

## 2019-05-08 VITALS — BP 104/67 | HR 87 | Temp 98.1°F | Resp 20 | Ht 70.98 in | Wt 297.0 lb

## 2019-05-08 DIAGNOSIS — C787 Secondary malignant neoplasm of liver and intrahepatic bile duct: Secondary | ICD-10-CM | POA: Insufficient documentation

## 2019-05-08 DIAGNOSIS — Z79899 Other long term (current) drug therapy: Secondary | ICD-10-CM | POA: Diagnosis not present

## 2019-05-08 DIAGNOSIS — E119 Type 2 diabetes mellitus without complications: Secondary | ICD-10-CM | POA: Diagnosis not present

## 2019-05-08 DIAGNOSIS — I10 Essential (primary) hypertension: Secondary | ICD-10-CM | POA: Diagnosis not present

## 2019-05-08 DIAGNOSIS — G4730 Sleep apnea, unspecified: Secondary | ICD-10-CM | POA: Diagnosis not present

## 2019-05-08 DIAGNOSIS — C778 Secondary and unspecified malignant neoplasm of lymph nodes of multiple regions: Secondary | ICD-10-CM | POA: Diagnosis not present

## 2019-05-08 DIAGNOSIS — R531 Weakness: Secondary | ICD-10-CM | POA: Insufficient documentation

## 2019-05-08 DIAGNOSIS — C7951 Secondary malignant neoplasm of bone: Secondary | ICD-10-CM | POA: Insufficient documentation

## 2019-05-08 DIAGNOSIS — C155 Malignant neoplasm of lower third of esophagus: Secondary | ICD-10-CM

## 2019-05-08 DIAGNOSIS — M79661 Pain in right lower leg: Secondary | ICD-10-CM | POA: Diagnosis not present

## 2019-05-08 DIAGNOSIS — E7800 Pure hypercholesterolemia, unspecified: Secondary | ICD-10-CM | POA: Insufficient documentation

## 2019-05-08 DIAGNOSIS — C786 Secondary malignant neoplasm of retroperitoneum and peritoneum: Secondary | ICD-10-CM | POA: Insufficient documentation

## 2019-05-08 DIAGNOSIS — R5383 Other fatigue: Secondary | ICD-10-CM | POA: Diagnosis not present

## 2019-05-08 DIAGNOSIS — M79662 Pain in left lower leg: Secondary | ICD-10-CM | POA: Insufficient documentation

## 2019-05-08 DIAGNOSIS — R5381 Other malaise: Secondary | ICD-10-CM | POA: Insufficient documentation

## 2019-05-08 DIAGNOSIS — Z7189 Other specified counseling: Secondary | ICD-10-CM

## 2019-05-08 DIAGNOSIS — B1920 Unspecified viral hepatitis C without hepatic coma: Secondary | ICD-10-CM | POA: Diagnosis not present

## 2019-05-08 DIAGNOSIS — Z5111 Encounter for antineoplastic chemotherapy: Secondary | ICD-10-CM | POA: Insufficient documentation

## 2019-05-08 DIAGNOSIS — Z95828 Presence of other vascular implants and grafts: Secondary | ICD-10-CM

## 2019-05-08 LAB — COMPREHENSIVE METABOLIC PANEL
ALT: 21 U/L (ref 0–44)
AST: 36 U/L (ref 15–41)
Albumin: 3.7 g/dL (ref 3.5–5.0)
Alkaline Phosphatase: 121 U/L (ref 38–126)
Anion gap: 11 (ref 5–15)
BUN: 19 mg/dL (ref 6–20)
CO2: 21 mmol/L — ABNORMAL LOW (ref 22–32)
Calcium: 8.9 mg/dL (ref 8.9–10.3)
Chloride: 102 mmol/L (ref 98–111)
Creatinine, Ser: 1.33 mg/dL — ABNORMAL HIGH (ref 0.61–1.24)
GFR calc Af Amer: >60 mL/min (ref >60)
GFR calc non Af Amer: 58 mL/min — ABNORMAL LOW (ref >60)
Glucose, Bld: 110 mg/dL — ABNORMAL HIGH (ref 70–99)
Potassium: 4.6 mmol/L (ref 3.5–5.1)
Sodium: 134 mmol/L — ABNORMAL LOW (ref 135–145)
Total Bilirubin: 1 mg/dL (ref 0.3–1.2)
Total Protein: 7.2 g/dL (ref 6.5–8.1)

## 2019-05-08 LAB — CBC WITH DIFFERENTIAL/PLATELET
Abs Immature Granulocytes: 0.05 10*3/uL (ref 0.00–0.07)
Basophils Absolute: 0 10*3/uL (ref 0.0–0.1)
Basophils Relative: 0 %
Eosinophils Absolute: 0.3 10*3/uL (ref 0.0–0.5)
Eosinophils Relative: 4 %
HCT: 35.5 % — ABNORMAL LOW (ref 39.0–52.0)
Hemoglobin: 12 g/dL — ABNORMAL LOW (ref 13.0–17.0)
Immature Granulocytes: 1 %
Lymphocytes Relative: 20 %
Lymphs Abs: 1.2 10*3/uL (ref 0.7–4.0)
MCH: 31.2 pg (ref 26.0–34.0)
MCHC: 33.8 g/dL (ref 30.0–36.0)
MCV: 92.2 fL (ref 80.0–100.0)
Monocytes Absolute: 1.1 10*3/uL — ABNORMAL HIGH (ref 0.1–1.0)
Monocytes Relative: 18 %
Neutro Abs: 3.5 10*3/uL (ref 1.7–7.7)
Neutrophils Relative %: 57 %
Platelets: 141 10*3/uL — ABNORMAL LOW (ref 150–400)
RBC: 3.85 MIL/uL — ABNORMAL LOW (ref 4.22–5.81)
RDW: 13 % (ref 11.5–15.5)
WBC: 6.1 10*3/uL (ref 4.0–10.5)
nRBC: 0 % (ref 0.0–0.2)

## 2019-05-08 LAB — SURGICAL PATHOLOGY

## 2019-05-08 MED ORDER — DEXAMETHASONE SODIUM PHOSPHATE 10 MG/ML IJ SOLN
10.0000 mg | Freq: Once | INTRAMUSCULAR | Status: AC
  Administered 2019-05-08: 10 mg via INTRAVENOUS
  Filled 2019-05-08: qty 1

## 2019-05-08 MED ORDER — SODIUM CHLORIDE 0.9 % IV SOLN
2400.0000 mg/m2 | INTRAVENOUS | Status: DC
  Administered 2019-05-08: 6450 mg via INTRAVENOUS
  Filled 2019-05-08: qty 129

## 2019-05-08 MED ORDER — SODIUM CHLORIDE 0.9 % IV SOLN
Freq: Once | INTRAVENOUS | Status: AC
  Filled 2019-05-08: qty 250

## 2019-05-08 MED ORDER — SODIUM CHLORIDE 0.9% FLUSH
10.0000 mL | Freq: Once | INTRAVENOUS | Status: AC
  Administered 2019-05-08: 11:00:00 10 mL via INTRAVENOUS
  Filled 2019-05-08: qty 10

## 2019-05-08 NOTE — Progress Notes (Signed)
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  # June rd week- 5FU pump every 2 days; +RT [until aug 4th]  # 10/16/2018-FOLFOX #1; 9/21-PET- PR. 11/28/2018- cycle #6-decreased the ox dose to 60 mg/2 [sec to platelets- 94]. S/p 12 cycles of FOLFOX; DEC 15th 2020- switched to 5 CIV ONLY; dced Oxaliplatin.   # DM- diet controlled; Hepatitis C; September 2020-right kidney stone/spontaneous; foreign body impaction/spontaneous  # NGS/omniseq- PDL-1 CPS 20%;EGFR-copy number gain; NEG- targets/ Her-2 neu.   DIAGNOSIS: Adenocarcinoma esophagus/GE junction  STAGE: IV     ;GOALS: pallaitive  CURRENT/MOST RECENT THERAPY: 5FU  CIV q 2 weeks [C]      Malignant neoplasm of lower third of esophagus ([place])  08/30/2018 -  Chemotherapy   The patient had palonosetron (ALOXI) injection 0.25 mg, 0.25 mg, Intravenous,  Once, 9 of 9 cycles Administration: 0.25 mg (10/30/2018), 0.25 mg (10/16/2018), 0.25 mg (11/28/2018), 0.25 mg (12/12/2018), 0.25 mg (11/14/2018), 0.25 mg (12/26/2018), 0.25 mg (01/09/2019), 0.25 mg (01/23/2019), 0.25 mg (02/06/2019) leucovorin 1,072 mg in  dextrose 5 % 250 mL infusion, 400 mg/m2 = 1,072 mg, Intravenous,  Once, 11 of 11 cycles Administration: 1,000 mg (10/30/2018), 1,000 mg (10/16/2018), 1,000 mg (11/28/2018), 1,000 mg (12/12/2018), 1,000 mg (12/26/2018), 1,000 mg (01/09/2019), 1,000 mg (01/23/2019), 1,000 mg (02/06/2019) oxaliplatin (ELOXATIN) 230 mg in dextrose 5 % 500 mL chemo infusion, 85 mg/m2 = 230 mg, Intravenous,  Once, 9 of 9 cycles Dose modification: 60 mg/m2 (original dose 85 mg/m2, Cycle 7, Reason: Provider Judgment) Administration: 230 mg (10/30/2018), 230 mg (10/16/2018), 160 mg (11/28/2018), 160 mg (12/12/2018), 230 mg (11/14/2018), 160 mg (12/26/2018), 160 mg (01/09/2019), 160 mg (01/23/2019), 160 mg (02/06/2019) leucovorin injection 54 mg, 20 mg/m2 = 54 mg (100 % of original dose 20 mg/m2), Intravenous,  Once, 8 of 8 cycles Dose modification: 20 mg/m2 (original dose 20 mg/m2, Cycle 2, Reason: Other (see comments), Comment: iv push), 20 mg/m2 (original dose 20 mg/m2, Cycle 3, Reason: Other (see comments), Comment: ivp) Administration: 54 mg (09/13/2018), 54 mg (10/02/2018) fluorouracil (ADRUCIL) 6,450 mg in sodium chloride 0.9 % 121 mL chemo infusion, 2,400 mg/m2 = 6,450 mg, Intravenous, 1 Day/Dose, 18 of 20 cycles Administration: 6,450 mg (08/30/2018), 6,450 mg (09/13/2018), 6,450 mg (10/30/2018), 6,450 mg (10/02/2018), 6,450 mg (10/16/2018), 6,450 mg (11/28/2018), 6,450 mg (12/12/2018), 6,450 mg (11/14/2018), 6,450 mg (12/26/2018), 6,450 mg (01/09/2019), 6,450 mg (01/23/2019), 6,450 mg (02/06/2019), 6,450 mg (02/20/2019), 6,450 mg (03/06/2019), 6,450 mg (03/20/2019), 6,450 mg (04/03/2019), 6,450 mg (04/17/2019), 6,450 mg (05/08/2019)  for chemotherapy treatment.      HISTORY OF PRESENTING ILLNESS:  Matthew Yang 59 y.o.  male stage IV adenocarcinoma esophagus currently on 5-FU CIV chemotherapy is here.   In the interim patient underwent upper endoscopy-patient noted to have good response to therapy.  Denies any difficulty swallowing.  No nausea no  vomiting.  Tingling and numbness improved.  Had a recent UA with urology negative for infection.  Review of Systems  Constitutional: Positive for malaise/fatigue. Negative for chills, diaphoresis and fever.  HENT: Negative for nosebleeds and sore throat.   Eyes: Negative for double vision.  Respiratory: Negative for cough, hemoptysis, sputum production, shortness of breath and wheezing.   Cardiovascular: Negative for chest pain, palpitations, orthopnea and leg swelling.  Gastrointestinal: Negative for abdominal pain, blood in stool, diarrhea, heartburn, melena and nausea.  Genitourinary: Positive for frequency.  Musculoskeletal: Negative for back pain and joint pain.  Skin: Negative.  Negative for itching and rash.  Neurological: Positive for tingling. Negative for dizziness, focal weakness, weakness and headaches.  Endo/Heme/Allergies: Does not bruise/bleed easily.  Psychiatric/Behavioral: Negative for depression. The patient is not nervous/anxious and does not have insomnia.      MEDICAL HISTORY:  Past Medical History:  Diagnosis Date  . Diabetes mellitus without complication ([place])   . Esophagus cancer ([place])   . Hepatitis    HEPATITIS C  . Hypercholesteremia   . Hypertension   . Morbid obesity ([place])   . Positive hepatitis C antibody test   . Sleep apnea   . Tubular adenoma of colon    polyp    SURGICAL HISTORY: Past Surgical History:  Procedure Laterality Date  . CHOLECYSTECTOMY    . COLONOSCOPY    . COLONOSCOPY WITH PROPOFOL N/A 05/04/2016   Procedure: COLONOSCOPY WITH PROPOFOL;  Surgeon: Lollie Sails, MD;  Location: [facility] ENDOSCOPY;  Service: Endoscopy;  Laterality: N/A;  . ESOPHAGOGASTRODUODENOSCOPY N/A 05/03/2019   Procedure: ESOPHAGOGASTRODUODENOSCOPY (EGD);  Surgeon: Toledo, Benay Pike, MD;  Location: [facility] ENDOSCOPY;  Service: Gastroenterology;  Laterality: N/A;  . ESOPHAGOGASTRODUODENOSCOPY (EGD) WITH PROPOFOL N/A 08/04/2018   Procedure:  ESOPHAGOGASTRODUODENOSCOPY (EGD) WITH PROPOFOL;  Surgeon: Lollie Sails, MD;  Location: [facility] ENDOSCOPY;  Service: Endoscopy;  Laterality: N/A;  . PORTA CATH INSERTION N/A 08/17/2018   Procedure: PORTA CATH INSERTION;  Surgeon: Algernon Huxley, MD;  Location: [facility] LAB;  Service: Cardiovascular;  Laterality: N/A;    SOCIAL HISTORY: Social History   Socioeconomic History  . Marital status: Married    Spouse name: Not on file  . Number of children: Not on file  . Years of education: Not on file  . Highest education level: Not on file  Occupational History  . Not on file  Tobacco Use  . Smoking status: Never Smoker  . Smokeless tobacco: Never Used  Substance and Sexual Activity  . Alcohol use: Not Currently  . Drug use: No  . Sexual activity: Not on file  Other Topics Concern  . 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    Social Determinants of Health   Financial Resource Strain:   . Difficulty of Paying Living Expenses: Not on file  Food Insecurity:   . Worried About Charity fundraiser in the Last Year: Not on file  . Ran Out of Food in the Last Year: Not on file  Transportation Needs:   . Lack of Transportation (Medical): Not on file  . Lack of Transportation (Non-Medical): Not on file  Physical Activity:   . Days of Exercise per Week: Not on file  . Minutes of Exercise per Session: Not on file  Stress:   . Feeling of Stress : Not on file  Social  Connections:   . Frequency of Communication with Friends and Family: Not on file  . Frequency of Social Gatherings with Friends and Family: Not on file  . Attends Religious Services: Not on file  . Active Member of Clubs or Organizations: Not on file  . Attends Archivist Meetings: Not on file  . Marital Status: Not on file  Intimate Partner Violence:   . Fear of Current or Ex-Partner: Not on file  . Emotionally Abused: Not on  file  . Physically Abused: Not on file  . Sexually Abused: Not on file    FAMILY HISTORY: Family History  Family history unknown: Yes    ALLERGIES:  has No Known Allergies.  MEDICATIONS:  Current Outpatient Medications  Medication Sig Dispense Refill  . atorvastatin (LIPITOR) 20 MG tablet Take 20 mg by mouth daily.    Marland Kitchen lidocaine-prilocaine (EMLA) cream Apply 1 application topically as needed. 30 g 4  . lisinopril (PRINIVIL,ZESTRIL) 20 MG tablet Take 20 mg by mouth daily.    . Multiple Vitamin (MULTIVITAMIN WITH MINERALS) TABS tablet Take 1 tablet by mouth daily.    . Omega-3 Fatty Acids (FISH OIL) 1000 MG CAPS Take 1,000 mg by mouth daily.     Marland Kitchen sulfamethoxazole-trimethoprim (BACTRIM DS) 800-160 MG tablet Take 1 tablet by mouth every 12 (twelve) hours for 21 days. 42 tablet 0  . Turmeric 500 MG CAPS Take 500 mg by mouth daily.    . ondansetron (ZOFRAN) 8 MG tablet Take 1 tablet (8 mg total) by mouth every 8 (eight) hours as needed for nausea or vomiting. (Patient not taking: Reported on 04/03/2019) 20 tablet 4  . pantoprazole (PROTONIX) 40 MG tablet Take 40 mg by mouth daily.    . prochlorperazine (COMPAZINE) 10 MG tablet Take 1 tablet (10 mg total) by mouth every 6 (six) hours as needed for nausea or vomiting. (Patient not taking: Reported on 05/08/2019) 30 tablet 4  . Selenium (SELENIMIN PO) Take by mouth.     No current facility-administered medications for this visit.   Facility-Administered Medications Ordered in Other Visits  Medication Dose Route Frequency Provider Last Rate Last Admin  . fluorouracil (ADRUCIL) 6,450 mg in sodium chloride 0.9 % 121 mL chemo infusion  2,400 mg/m2 (Treatment Plan Recorded) Intravenous 1 day or 1 dose Cammie Sickle, MD   6,450 mg at 05/08/19 1210      .  PHYSICAL EXAMINATION: ECOG PERFORMANCE STATUS: 1 - Symptomatic but completely ambulatory  Vitals:   05/08/19 1107  BP: 104/67  Pulse: 87  Resp: 20  Temp: 98.1 F (36.7 C)    Filed Weights   05/08/19 1107  Weight: 297 lb (134.7 kg)    Physical Exam  Constitutional: He is oriented to person, place, and time and well-developed, well-nourished, and in no distress.  HENT:  Head: Normocephalic and atraumatic.  Mouth/Throat: Oropharynx is clear and moist. No oropharyngeal exudate.  Eyes: Pupils are equal, round, and reactive to light.  Cardiovascular: Normal rate and regular rhythm.  Pulmonary/Chest: Effort normal and breath sounds normal. No respiratory distress. He has no wheezes.  Abdominal: Soft. Bowel sounds are normal. He exhibits no distension and no mass. There is no abdominal tenderness. There is no rebound and no guarding.  Musculoskeletal:        General: No tenderness or edema. Normal range of motion.     Cervical back: Normal range of motion and neck supple.  Neurological: He is alert and oriented to person, place, and  time.  Skin: Skin is warm.  Psychiatric: Affect normal.     LABORATORY DATA:  I have reviewed the data as listed Lab Results  Component Value Date   WBC 6.1 05/08/2019   HGB 12.0 (L) 05/08/2019   HCT 35.5 (L) 05/08/2019   MCV 92.2 05/08/2019   PLT 141 (L) 05/08/2019   Recent Labs    04/03/19 0848 04/17/19 0948 05/08/19 1038  NA 135 135 134*  K 4.1 3.8 4.6  CL 103 103 102  CO2 25 24 21*  GLUCOSE 159* 127* 110*  BUN _0 CREATININE 0.96 0.88 1.33*  CALCIUM 8.9 8.7* 8.9  GFRNONAA >60 >60 58*  GFRAA >60 >60 >60  PROT 7.2 7.0 7.2  ALBUMIN 3.8 3.7 3.7  AST 28 23 36  ALT _1 ALKPHOS 117 105 121  BILITOT 1.2 1.0 1.0    RADIOGRAPHIC STUDIES: I have personally reviewed the radiological images as listed and agreed with the findings in the report. No results found.  ASSESSMENT & PLAN:   Malignant neoplasm of lower third of esophagus ([place]) #Adenocarcinoma [poorly differentiated/question signet ring morphology] lower third of esophagus/GE junction. Stage IV.  Currently on 5FU CIV [dced Ox after 12  cycles].   DEC 11th 2020-CT scan- 7 mm retroperitoneal lymph node improved from 10 mm previous. STABLE; EGD 25th 2021- clinically improved on EGD; Biopsy-pending.   # Proceed with 5-FU CIV; Labs today reviewed;  acceptable for treatment today.  Will order imaging at next visit.   # Left renal colic/UTI -repeat UA- NEGATIVE.   # PN-1-2- cold induced- sec to Oxaliplatin. Stable.   # DISPOSITION: # 5FU chemo; Treatment today; pump off in 2 days.  # follow up in 2 weeks- MD; labs- cbc/cmp- 5FU IV pump; pump of in 2 days;PET scan prior -Dr.B  Cc; Dr.Toledo/Skulskie   All questions were answered. The patient knows to call the clinic with any problems, questions or concerns.    Cammie Sickle, MD 05/08/2019 12:48 PM

## 2019-05-08 NOTE — Progress Notes (Signed)
Patient here for chemotherapy today. He has no medical complaints today.

## 2019-05-08 NOTE — Assessment & Plan Note (Addendum)
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  Currently on 5FU CIV [dced Ox after 12 cycles].   DEC 11th 2020-CT scan- 7 mm retroperitoneal lymph node improved from 10 mm previous. STABLE; EGD 25th 2021- clinically improved on EGD; Biopsy-pending.  Discussed with Dr. Alice Reichert.  # Proceed with 5-FU CIV; Labs today reviewed;  acceptable for treatment today.  Will order imaging at next visit.   # Left renal colic/UTI -repeat UA- NEGATIVE.   # PN-1-2- cold induced- sec to Oxaliplatin. Stable.   # DISPOSITION: # 5FU chemo; Treatment today; pump off in 2 days.  # follow up in 2 weeks- MD; labs- cbc/cmp- 5FU IV pump; pump of in 2 days;PET scan prior -Dr.B  Cc; Dr.Toledo/Skulskie

## 2019-05-09 NOTE — Anesthesia Postprocedure Evaluation (Signed)
Anesthesia Post Note  Patient: Matthew Yang  Procedure(s) Performed: ESOPHAGOGASTRODUODENOSCOPY (EGD) (N/A )  Patient location during evaluation: PACU Anesthesia Type: General Level of consciousness: awake and alert Pain management: pain level controlled Vital Signs Assessment: post-procedure vital signs reviewed and stable Respiratory status: spontaneous breathing, nonlabored ventilation and respiratory function stable Cardiovascular status: blood pressure returned to baseline and stable Postop Assessment: no apparent nausea or vomiting Anesthetic complications: no     Last Vitals:  Vitals:   05/03/19 1014 05/03/19 1024  BP: 124/78 121/81  Pulse:    Resp:    Temp:    SpO2:      Last Pain:  Vitals:   05/04/19 0739  TempSrc:   PainSc: [place]

## 2019-05-10 ENCOUNTER — Other Ambulatory Visit: Payer: Self-pay

## 2019-05-10 ENCOUNTER — Inpatient Hospital Stay: Payer: No Typology Code available for payment source

## 2019-05-10 VITALS — BP 100/54 | HR 85 | Resp 19

## 2019-05-10 DIAGNOSIS — Z7189 Other specified counseling: Secondary | ICD-10-CM

## 2019-05-10 DIAGNOSIS — Z5111 Encounter for antineoplastic chemotherapy: Secondary | ICD-10-CM | POA: Diagnosis not present

## 2019-05-10 DIAGNOSIS — C155 Malignant neoplasm of lower third of esophagus: Secondary | ICD-10-CM

## 2019-05-10 MED ORDER — SODIUM CHLORIDE 0.9% FLUSH
10.0000 mL | INTRAVENOUS | Status: DC | PRN
  Administered 2019-05-10: 10 mL
  Filled 2019-05-10: qty 10

## 2019-05-10 MED ORDER — HEPARIN SOD (PORK) LOCK FLUSH 100 UNIT/ML IV SOLN
INTRAVENOUS | Status: AC
  Filled 2019-05-10: qty 5

## 2019-05-10 MED ORDER — HEPARIN SOD (PORK) LOCK FLUSH 100 UNIT/ML IV SOLN
500.0000 [IU] | Freq: Once | INTRAVENOUS | Status: AC | PRN
  Administered 2019-05-10: 500 [IU]
  Filled 2019-05-10: qty 5

## 2019-05-11 ENCOUNTER — Telehealth: Payer: Self-pay | Admitting: *Deleted

## 2019-05-11 NOTE — Telephone Encounter (Signed)
Wife called reporting that patient called her stating that he is very dizzy and cannot walk. She requested he be called back. He did not answer his phine so I left a message and called her back to advise that he have someone take him to the ER for evaluation. She agreed to this

## 2019-05-14 NOTE — Telephone Encounter (Signed)
Reviewed chart. No ER admissions. Attempted follow-up call for patient. Left vm for patient 11 am on 05/14/2019 for patient to return my phone call.

## 2019-05-17 ENCOUNTER — Inpatient Hospital Stay: Payer: No Typology Code available for payment source

## 2019-05-17 NOTE — Progress Notes (Signed)
Nutrition  Called patient for nutrition follow-up.  No answer.  Left message with call back number.   Sanaiyah Kirchhoff B. Erle Guster, RD, LDN Registered Dietitian [phone] (pager)   

## 2019-05-21 ENCOUNTER — Other Ambulatory Visit: Payer: Self-pay

## 2019-05-21 ENCOUNTER — Ambulatory Visit
Admission: RE | Admit: 2019-05-21 | Discharge: 2019-05-21 | Disposition: A | Discharge status: Home / Self Care | Payer: No Typology Code available for payment source | Source: Ambulatory Visit | Attending: Internal Medicine | Admitting: Internal Medicine

## 2019-05-21 ENCOUNTER — Telehealth: Payer: Self-pay | Admitting: Internal Medicine

## 2019-05-21 DIAGNOSIS — C155 Malignant neoplasm of lower third of esophagus: Secondary | ICD-10-CM | POA: Insufficient documentation

## 2019-05-21 DIAGNOSIS — C7951 Secondary malignant neoplasm of bone: Secondary | ICD-10-CM | POA: Insufficient documentation

## 2019-05-21 DIAGNOSIS — K7460 Unspecified cirrhosis of liver: Secondary | ICD-10-CM | POA: Diagnosis not present

## 2019-05-21 DIAGNOSIS — C772 Secondary and unspecified malignant neoplasm of intra-abdominal lymph nodes: Secondary | ICD-10-CM | POA: Diagnosis not present

## 2019-05-21 LAB — GLUCOSE, CAPILLARY: Glucose-Capillary: 91 mg/dL (ref 70–99)

## 2019-05-21 MED ORDER — FLUDEOXYGLUCOSE F - 18 (FDG) INJECTION
15.9400 | Freq: Once | INTRAVENOUS | Status: AC | PRN
  Administered 2019-05-21: 15.94 via INTRAVENOUS

## 2019-05-21 NOTE — Telephone Encounter (Signed)
On 3/15- spoke to pt & wife re: progression of disease noted on PET scan. Would recommend alternative therapy- will discuss further at tomorrow visit.   Cancel chemo for tomorrow. Keep appt- MD; labs

## 2019-05-22 ENCOUNTER — Inpatient Hospital Stay: Payer: No Typology Code available for payment source

## 2019-05-22 ENCOUNTER — Inpatient Hospital Stay (HOSPITAL_BASED_OUTPATIENT_CLINIC_OR_DEPARTMENT_OTHER): Payer: No Typology Code available for payment source | Admitting: Internal Medicine

## 2019-05-22 DIAGNOSIS — Z5111 Encounter for antineoplastic chemotherapy: Secondary | ICD-10-CM | POA: Diagnosis not present

## 2019-05-22 DIAGNOSIS — C155 Malignant neoplasm of lower third of esophagus: Secondary | ICD-10-CM

## 2019-05-22 LAB — COMPREHENSIVE METABOLIC PANEL
ALT: 59 U/L — ABNORMAL HIGH (ref 0–44)
AST: 85 U/L — ABNORMAL HIGH (ref 15–41)
Albumin: 3.6 g/dL (ref 3.5–5.0)
Alkaline Phosphatase: 174 U/L — ABNORMAL HIGH (ref 38–126)
Anion gap: 11 (ref 5–15)
BUN: 18 mg/dL (ref 6–20)
CO2: 23 mmol/L (ref 22–32)
Calcium: 9 mg/dL (ref 8.9–10.3)
Chloride: 101 mmol/L (ref 98–111)
Creatinine, Ser: 0.9 mg/dL (ref 0.61–1.24)
GFR calc Af Amer: >60 mL/min (ref >60)
GFR calc non Af Amer: >60 mL/min (ref >60)
Glucose, Bld: 165 mg/dL — ABNORMAL HIGH (ref 70–99)
Potassium: 4.7 mmol/L (ref 3.5–5.1)
Sodium: 135 mmol/L (ref 135–145)
Total Bilirubin: 1.4 mg/dL — ABNORMAL HIGH (ref 0.3–1.2)
Total Protein: 7.6 g/dL (ref 6.5–8.1)

## 2019-05-22 LAB — CBC WITH DIFFERENTIAL/PLATELET
Abs Immature Granulocytes: 0.05 10*3/uL (ref 0.00–0.07)
Basophils Absolute: 0 10*3/uL (ref 0.0–0.1)
Basophils Relative: 0 %
Eosinophils Absolute: 0.1 10*3/uL (ref 0.0–0.5)
Eosinophils Relative: 2 %
HCT: 34.5 % — ABNORMAL LOW (ref 39.0–52.0)
Hemoglobin: 11.7 g/dL — ABNORMAL LOW (ref 13.0–17.0)
Immature Granulocytes: 1 %
Lymphocytes Relative: 14 %
Lymphs Abs: 1 10*3/uL (ref 0.7–4.0)
MCH: 31 pg (ref 26.0–34.0)
MCHC: 33.9 g/dL (ref 30.0–36.0)
MCV: 91.5 fL (ref 80.0–100.0)
Monocytes Absolute: 1 10*3/uL (ref 0.1–1.0)
Monocytes Relative: 14 %
Neutro Abs: 4.9 10*3/uL (ref 1.7–7.7)
Neutrophils Relative %: 69 %
Platelets: 119 10*3/uL — ABNORMAL LOW (ref 150–400)
RBC: 3.77 MIL/uL — ABNORMAL LOW (ref 4.22–5.81)
RDW: 13.2 % (ref 11.5–15.5)
WBC: 7.1 10*3/uL (ref 4.0–10.5)
nRBC: 0 % (ref 0.0–0.2)

## 2019-05-22 MED ORDER — LIDOCAINE-PRILOCAINE 2.5-2.5 % EX CREA: 1.0000 "application " | TOPICAL_CREAM | CUTANEOUS | 4 refills | Status: AC | PRN

## 2019-05-22 NOTE — Progress Notes (Signed)
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  # June rd week- 5FU pump every 2 days; +RT [until aug 4th]  # 10/16/2018-FOLFOX #1; 9/21-PET- PR. 11/28/2018- cycle #6-decreased the ox dose to 60 mg/2 [sec to platelets- 94]. S/p 12 cycles of FOLFOX; DEC 15th 2020- switched to 5 CIV ONLY; dced Oxaliplatin.   # MARCH 15th 2021-progression of disease-liver mets bone mets/thoracic abdominal retroperitoneal lymph nodes  # MARCH 23,2021- TAX+RAM   # MARCH 2021- Bone mets- Zometa  # DM- diet controlled; Hepatitis C; September 2020-right kidney stone/spontaneous; foreign body impaction/spontaneous  # EGD-[Dr.Toledo] FEB 2021-complete response to tumor  # NGS/omniseq- PDL-1 CPS 20%;EGFR-copy number gain; NEG- targets/ Her-2 neu.   # PALLIATIVE CARE: P  DIAGNOSIS: Adenocarcinoma esophagus/GE junction  STAGE: IV     ;GOALS: pallaitive  CURRENT/MOST RECENT THERAPY: TAX+RAM [C]      Malignant neoplasm of lower third of esophagus ([place])  08/30/2018 - 05/21/2019 Chemotherapy   The patient had palonosetron (ALOXI) injection 0.25 mg, 0.25 mg,  Intravenous,  Once, 9 of 9 cycles Administration: 0.25 mg (10/30/2018), 0.25 mg (10/16/2018), 0.25 mg (11/28/2018), 0.25 mg (12/12/2018), 0.25 mg (11/14/2018), 0.25 mg (12/26/2018), 0.25 mg (01/09/2019), 0.25 mg (01/23/2019), 0.25 mg (02/06/2019) leucovorin 1,072 mg in dextrose 5 % 250 mL infusion, 400 mg/m2 = 1,072 mg, Intravenous,  Once, 11 of 11 cycles Administration: 1,000 mg (10/30/2018), 1,000 mg (10/16/2018), 1,000 mg (11/28/2018), 1,000 mg (12/12/2018), 1,000 mg (12/26/2018), 1,000 mg (01/09/2019), 1,000 mg (01/23/2019), 1,000 mg (02/06/2019) oxaliplatin (ELOXATIN) 230 mg in dextrose 5 % 500 mL chemo infusion, 85 mg/m2 = 230 mg, Intravenous,  Once, 9 of 9 cycles Dose modification: 60 mg/m2 (original dose 85 mg/m2, Cycle 7, Reason: Provider Judgment) Administration: 230 mg (10/30/2018), 230 mg (10/16/2018), 160 mg (11/28/2018), 160 mg (12/12/2018), 230 mg (11/14/2018), 160 mg (12/26/2018), 160 mg (01/09/2019), 160 mg (01/23/2019), 160 mg (02/06/2019) leucovorin injection 54 mg, 20 mg/m2 = 54 mg (100 % of original dose 20 mg/m2), Intravenous,  Once, 8 of 8 cycles Dose modification: 20 mg/m2 (original dose 20 mg/m2, Cycle 2, Reason: Other (see comments), Comment: iv push), 20 mg/m2 (original dose 20 mg/m2, Cycle 3, Reason: Other (see comments), Comment: ivp) Administration: 54 mg (09/13/2018), 54 mg (10/02/2018) fluorouracil (ADRUCIL) 6,450 mg in sodium chloride 0.9 % 121 mL chemo infusion, 2,400 mg/m2 = 6,450 mg, Intravenous, 1 Day/Dose, 18 of 20 cycles Administration: 6,450 mg (08/30/2018), 6,450 mg (09/13/2018), 6,450 mg (10/30/2018), 6,450 mg (10/02/2018), 6,450 mg (10/16/2018), 6,450 mg (11/28/2018), 6,450 mg (12/12/2018), 6,450 mg (11/14/2018), 6,450 mg (12/26/2018), 6,450 mg (01/09/2019), 6,450 mg (01/23/2019), 6,450 mg (02/06/2019), 6,450 mg (02/20/2019), 6,450 mg (03/06/2019), 6,450 mg (03/20/2019), 6,450 mg (04/03/2019), 6,450 mg (04/17/2019), 6,450 mg (05/08/2019)  for chemotherapy treatment.    05/23/2019 -  Chemotherapy  The  patient had PACLitaxel (TAXOL) 204 mg in sodium chloride 0.9 % 250 mL chemo infusion (</= 38m/m2), 80 mg/m2, Intravenous,  Once, 0 of 6 cycles ramucirumab (CYRAMZA) 1,100 mg in sodium chloride 0.9 % 140 mL chemo infusion, 8 mg/kg, Intravenous, Once, 0 of 6 cycles  for chemotherapy treatment.      HISTORY OF PRESENTING ILLNESS:  Matthew [id number].  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  Patient denies any difficulty swallowing denies any nausea vomiting.  Noted to have mild pain in his back not any worse.  Otherwise he is able to carry out his daily activities.  No nausea no vomiting.  No headaches.  Review of Systems  Constitutional: Positive for malaise/fatigue. Negative for chills, diaphoresis and fever.  HENT: Negative for nosebleeds and sore throat.   Eyes: Negative for double vision.  Respiratory: Negative for cough, hemoptysis, sputum production, shortness of breath and wheezing.   Cardiovascular: Negative for chest pain, palpitations, orthopnea and leg swelling.  Gastrointestinal: Negative for abdominal pain, blood in stool, diarrhea, heartburn, melena and nausea.  Genitourinary: Positive for frequency.  Musculoskeletal: Negative for back pain and joint pain.  Skin: Negative.  Negative for itching and rash.  Neurological: Positive for tingling. Negative for dizziness, focal weakness, weakness and headaches.  Endo/Heme/Allergies: Does not bruise/bleed easily.  Psychiatric/Behavioral: Negative for depression. The patient is not nervous/anxious and does not have insomnia.      MEDICAL HISTORY:  Past Medical History:  Diagnosis Date  . Diabetes mellitus without complication ([place]   . Esophagus cancer ([place]   . Hepatitis    HEPATITIS C  . Hypercholesteremia   . Hypertension   . Morbid obesity ([place]   . Positive hepatitis C antibody test   . Sleep apnea   . Tubular adenoma of colon     polyp    SURGICAL HISTORY: Past Surgical History:  Procedure Laterality Date  . CHOLECYSTECTOMY    . COLONOSCOPY    . COLONOSCOPY WITH PROPOFOL N/A 05/04/2016   Procedure: COLONOSCOPY WITH PROPOFOL;  Surgeon: MLollie Sails MD;  [facility];  Service: Endoscopy;  Laterality: N/A;  . ESOPHAGOGASTRODUODENOSCOPY N/A 05/03/2019   Procedure: ESOPHAGOGASTRODUODENOSCOPY (EGD);  Surgeon: Toledo, TBenay Pike MD;  Location: [facility] ENDOSCOPY;  Service: Gastroenterology;  Laterality: N/A;  . ESOPHAGOGASTRODUODENOSCOPY (EGD) WITH PROPOFOL N/A 08/04/2018   Procedure: ESOPHAGOGASTRODUODENOSCOPY (EGD) WITH PROPOFOL;  Surgeon: SLollie Sails MD;  [facility];  Service: Endoscopy;  Laterality: N/A;  . PORTA CATH INSERTION N/A 08/17/2018   Procedure: PORTA CATH INSERTION;  Surgeon: DAlgernon Huxley MD;  [facility];  Service: Cardiovascular;  Laterality: N/A;    SOCIAL HISTORY: Social History   Socioeconomic History  . Marital status: Married    Spouse name: Not on file  . Number of children: Not on file  . Years of education: Not on file  . Highest education level: Not on file  Occupational History  . Not on file  Tobacco Use  . Smoking status: Never Smoker  . Smokeless tobacco: Never Used  Substance and Sexual Activity  . Alcohol use: Not Currently  . Drug use: No  . Sexual activity: Not on file  Other Topics Concern  . 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 Difficulty of Paying Living Expenses:   Food Insecurity:   . Worried About Charity fundraiser in the Last Year:   . Arboriculturist in the Last Year:   Transportation Needs:   . Film/video editor (Medical):   Marland Kitchen Lack of Transportation (Non-Medical):   Physical Activity:   . Days of Exercise per Week:   . Minutes of Exercise per Session:    Stress:   . Feeling of Stress :   Social Connections:   . Frequency of Communication with Friends and Family:   . Frequency of Social Gatherings with Friends and Family:   . Attends Religious Services:   . Active Member of Clubs or Organizations:   . Attends Archivist Meetings:   Marland Kitchen Marital Status:   Intimate Partner Violence:   . Fear of Current or Ex-Partner:   . Emotionally Abused:   Marland Kitchen Physically Abused:   . Sexually Abused:     FAMILY HISTORY: Family History  Family history unknown: Yes    ALLERGIES:  has No Known Allergies.  MEDICATIONS:  Current Outpatient Medications  Medication Sig Dispense Refill  . atorvastatin (LIPITOR) 20 MG tablet Take 20 mg by mouth daily.    Marland Kitchen lidocaine-prilocaine (EMLA) cream Apply 1 application topically as needed. 30 g 4  . lisinopril (PRINIVIL,ZESTRIL) 20 MG tablet Take 20 mg by mouth daily.    . Multiple Vitamin (MULTIVITAMIN WITH MINERALS) TABS tablet Take 1 tablet by mouth daily.    . Omega-3 Fatty Acids (FISH OIL) 1000 MG CAPS Take 1,000 mg by mouth daily.     . ondansetron (ZOFRAN) 8 MG tablet Take 1 tablet (8 mg total) by mouth every 8 (eight) hours as needed for nausea or vomiting. 20 tablet 4  . pantoprazole (PROTONIX) 40 MG tablet Take 40 mg by mouth daily.    . prochlorperazine (COMPAZINE) 10 MG tablet Take 1 tablet (10 mg total) by mouth every 6 (six) hours as needed for nausea or vomiting. 30 tablet 4  . Selenium (SELENIMIN PO) Take by mouth.    . Turmeric 500 MG CAPS Take 500 mg by mouth daily.     No current facility-administered medications for this visit.      Marland Kitchen  PHYSICAL EXAMINATION: ECOG PERFORMANCE STATUS: 1 - Symptomatic but completely ambulatory  Vitals:   05/22/19 1316  BP: 122/73  Pulse: 96  Resp: 20  Temp: 98.3 F (36.8 C)   Filed Weights   05/22/19 1316  Weight: 293 lb (132.9 kg)    Physical Exam  Constitutional: He is oriented to person, place, and time and well-developed,  well-nourished, and in no distress.  HENT:  Head: Normocephalic and atraumatic.  Mouth/Throat: Oropharynx is clear and moist. No oropharyngeal exudate.  Eyes: Pupils are equal, round, and reactive to light.  Cardiovascular: Normal rate and regular rhythm.  Pulmonary/Chest: Effort normal and breath sounds normal. No respiratory distress. He has no wheezes.  Abdominal: Soft. Bowel sounds are normal. He exhibits no distension and no mass. There is no abdominal tenderness. There is no rebound and no guarding.  Musculoskeletal:        General: No tenderness or edema. Normal range of motion.     Cervical back: Normal range of motion and neck supple.  Neurological: He is alert and oriented to person, place, and time.  Skin: Skin is warm.  Psychiatric: Affect normal.     LABORATORY DATA:  I have reviewed the data as  listed Lab Results  Component Value Date   WBC 7.1 05/22/2019   HGB 11.7 (L) 05/22/2019   HCT 34.5 (L) 05/22/2019   MCV 91.5 05/22/2019   PLT 119 (L) 05/22/2019   Recent Labs    04/17/19 0948 05/08/19 1038 05/22/19 1246  NA 135 134* 135  K 3.8 4.6 4.7  CL 103 102 101  CO2 24 21* 23  GLUCOSE 127* 110* 165*  BUN _0 CREATININE 0.88 1.33* 0.90  CALCIUM 8.7* 8.9 9.0  GFRNONAA >60 58* >60  GFRAA >60 >60 >60  PROT 7.0 7.2 7.6  ALBUMIN 3.7 3.7 3.6  AST 23 36 85*  ALT 17 21 59*  ALKPHOS 105 121 174*  BILITOT 1.0 1.0 1.4*    RADIOGRAPHIC STUDIES: I have personally reviewed the radiological images as listed and agreed with the findings in the report. NM PET Image Restag (PS) Skull Base To Thigh  Result Date: 05/21/2019 CLINICAL DATA:  Subsequent treatment strategy for distal esophageal cancer. EXAM: NUCLEAR MEDICINE PET SKULL BASE TO THIGH TECHNIQUE: 15.9 mCi F-18 FDG was injected intravenously. Full-ring PET imaging was performed from the skull base to thigh after the radiotracer. CT data was obtained and used for attenuation correction and anatomic  localization. Fasting blood glucose: 91 mg/dl COMPARISON:  CT abdomen/pelvis dated 03/01/2019. PET-CT dated 11/27/2018. FINDINGS: Mediastinal blood pool activity: SUV max 3.4 Liver activity: SUV max NA NECK: No hypermetabolic cervical lymphadenopathy. Focal hypermetabolism within the left pterygoid muscle, max SUV 5.6, although without CT abnormality. This appearance is indeterminate but of questionable clinical significance given the additional findings described below. Incidental CT findings: none CHEST: Patchy/nodular opacity in the posterior right lower lobe (series 3/image 136), max SUV 10.8. This may reflect mild infection/pneumonia, although tumor is not excluded given additional findings. Widespread thoracic nodal metastases, including: --14 mm short axis right supraclavicular node (series 3/image 68), max SUV 13.1 --14 mm short axis right paratracheal node (series 3/image 89), max SUV 14.3 --23 mm short axis subcarinal node (series 3/image 107), max SUV 19.0 --Approximate 24 mm short axis right hilar node (series 3/image 103), max SUV 15.9 --Approximate 17 mm short axis left hilar node (series 3/image 106), max SUV 10.7 No associated hypermetabolism involving the lower esophagus at the GE junction. No associated soft Jewel mass on CT. Incidental CT findings: Right chest port terminates at the cavoatrial junction. Atherosclerotic calcifications of the aortic arch. 3 vessel coronary atherosclerosis. ABDOMEN/PELVIS: Cirrhosis. Although poorly evaluated on unenhanced CT, there is suspected multifocal hepatic metastases with a heterogeneous dominant mass in the posterior right hepatic lobe, max SUV 18.8. Pancreas is poorly evaluated but there is no definite pancreatic mass, atrophy, or ductal dilatation. No abnormal hypermetabolism in the spleen or adrenal glands. Widespread abdominopelvic nodal metastases, including: --22 mm short axis peripancreatic node in the porta hepatis (series 2/image 162), max SUV 26.5  --27 mm short axis portacaval node (series 3/image 170), max SUV 24.1 --15 mm short axis left para-aortic node (series 3/image 137), max SUV 20.4 --20 mm short axis right retrocaval node (series 3/image 190), max SUV 24.4 Incidental CT findings: Prior cholecystectomy. Mild atherosclerotic calcifications the abdominal aorta and branch vessels. Dystrophic calcifications involving the prostate. SKELETON: Widespread osseous metastases throughout the visualized axial and appendicular skeleton, including: --Right C1 pedicle, max SUV 7.3 --Sternum, max SUV 7.6 --Left humeral head, max SUV 14.6 --Left lateral 4th rib, max SUV 5.7 --Right T12 vertebral body, max SUV 21.9 ---Left L2 vertebral body, max SUV 17.3 --Left  sacrum, max SUV 12.0 --Right iliac bone, max SUV 12.8 --Right anterior process the acetabulum, max SUV 13.2 --Left proximal femur, max SUV 5.9 No evidence of pathologic fracture. Incidental CT findings: Degenerative changes of the visualized thoracolumbar spine. IMPRESSION: No evidence of residual lower esophageal mass or hypermetabolism. Widespread thoracic, abdominal, and retroperitoneal nodal metastases, as above. Widespread osseous metastases, as above. Suspected multifocal hepatic metastases, including a dominant mass in the right hepatic lobe, poorly visualized/evaluated. Underlying cirrhosis. Patchy right lower lobe opacity, favoring pneumonia, tumor not excluded. Technically speaking, while this is presumed to reflect recurrence of the patient's known esophageal cancer, that is not confirmed. Regardless, the patient's right supraclavicular nodes would be amenable to excision/sampling if needed. Additional ancillary findings as above. Electronically Signed   By: Julian Hy M.D.   On: 05/21/2019 13:45    ASSESSMENT & PLAN:   Malignant neoplasm of lower third of esophagus ([place]) #Adenocarcinoma [poorly differentiated/question signet ring morphology] lower third of esophagus/GE junction. Stage  IV.  Worse. currently on 5-FU continuous infusion; PET scan 05/20/2019-significant progression of disease-thoracic abdominal retroperitoneal adenopathy; liver metastases and also bone metastases-vertebrae/ribs.  Patient is mildly symptomatic.  #Reviewed the unfortunate results of the PET scan at length.  Recommend discontinuation of 5-FU infusion.  Recommend Taxol plus Ram.  Discussed the potential side effects including low blood counts neuropathy.  Patient agrees to proceed with treatment. Treatments are palliative not curative.  #Discussed the life expectancy of metastatic adenocarcinoma esophagus-12 to 18 months.  I think it be reasonable to discuss advance planning at this time.  We will discuss palliative care evaluation at subsequent visits.  Declined second opinion/tertiary center.  #Bone metastases-vertebrae ribs-mildly symptomatic; not needing any pain medication at this time.  Recommend Zometa. W discuss.    # PN-1 sec to Oxaliplatin.  Stable  # DISPOSITION: # follow up 1 week [AM preference]; MD- labs- cbc/cmp;UA; Taxol-cyramza [NEW]Zometa- Dr.B  Cc; Dr.Anderson  # I reviewed the blood work- with the patient in detail; also reviewed the imaging independently [as summarized above]; and with the patient in detail.     All questions were answered. The patient knows to call the clinic with any problems, questions or concerns.    Cammie Sickle, MD 05/23/2019 7:09 AM

## 2019-05-22 NOTE — Assessment & Plan Note (Addendum)
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  Worse. currently on 5-FU continuous infusion; PET scan 05/20/2019-significant progression of disease-thoracic abdominal retroperitoneal adenopathy; liver metastases and also bone metastases-vertebrae/ribs.  Patient is mildly symptomatic.  #Reviewed the unfortunate results of the PET scan at length.  Recommend discontinuation of 5-FU infusion.  Recommend Taxol plus Ram.  Discussed the potential side effects including low blood counts neuropathy.  Patient agrees to proceed with treatment. Treatments are palliative not curative.  #Discussed the life expectancy of metastatic adenocarcinoma esophagus-12 to 18 months.  I think it be reasonable to discuss advance planning at this time.  We will discuss palliative care evaluation at subsequent visits.  Declined second opinion/tertiary center.  #Bone metastases-vertebrae ribs-mildly symptomatic; not needing any pain medication at this time.  Recommend Zometa. W discuss.    # PN-1 sec to Oxaliplatin.  Stable  # DISPOSITION: # follow up 1 week [AM preference]; MD- labs- cbc/cmp;UA; Taxol-cyramza [NEW]Zometa- Dr.B  Cc; Dr.Anderson  # I reviewed the blood work- with the patient in detail; also reviewed the imaging independently [as summarized above]; and with the patient in detail.

## 2019-05-23 NOTE — Progress Notes (Signed)
DISCONTINUE ON PATHWAY REGIMEN - Gastroesophageal ° ° °  A cycle is every 14 days: °    Oxaliplatin  °    Leucovorin  °    Fluorouracil  °    Fluorouracil  ° °**Always confirm dose/schedule in your pharmacy ordering system** ° °REASON: Disease Progression °PRIOR TREATMENT: GEOS3: mFOLFOX6 q14 Days Until Progression or Unacceptable Toxicity °TREATMENT RESPONSE: Progressive Disease (PD) ° °START ON PATHWAY REGIMEN - Gastroesophageal ° ° °  A cycle is every 28 days: °    Ramucirumab  °    Paclitaxel  ° °**Always confirm dose/schedule in your pharmacy ordering system** ° °Patient Characteristics: °Distant Metastases (cM1/pM1) / Locally Recurrent Disease, Adenocarcinoma - Esophageal, GE Junction, and Gastric, Second Line, MSS/pMMR or MSI Unknown °Histology: Adenocarcinoma °Disease Classification: GE Junction °Therapeutic Status: Distant Metastases (No Additional Staging) °Line of Therapy: Second Line °Microsatellite/Mismatch Repair Status: MSS/pMMR °Intent of Therapy: °Non-Curative / Palliative Intent, Discussed with Patient °

## 2019-05-24 ENCOUNTER — Inpatient Hospital Stay: Payer: No Typology Code available for payment source

## 2019-05-25 ENCOUNTER — Telehealth: Payer: Self-pay | Admitting: *Deleted

## 2019-05-25 NOTE — Telephone Encounter (Signed)
Wife called reporting that patient is having increasing [place]ness [place] parking lot to store and had to sit to catch his breath. She reports that she saw on his PET scan that he may have pneumonia and is asking if this could be causing his problem or the fact that he had his COVID vaccine first dose and is still recovering form that. I recommended that he go to a walk in or [facility] since he is out of town for evaluation even though he is afibrile. She agreed to this

## 2019-05-28 ENCOUNTER — Encounter: Payer: Self-pay | Admitting: Oncology

## 2019-05-28 ENCOUNTER — Inpatient Hospital Stay
Admission: EM | Admit: 2019-05-28 | Discharge: 2019-06-02 | DRG: 871 | Disposition: A | Discharge status: Home Health | Payer: No Typology Code available for payment source | Attending: Internal Medicine | Admitting: Internal Medicine

## 2019-05-28 ENCOUNTER — Inpatient Hospital Stay (HOSPITAL_BASED_OUTPATIENT_CLINIC_OR_DEPARTMENT_OTHER): Payer: No Typology Code available for payment source | Admitting: Oncology

## 2019-05-28 ENCOUNTER — Emergency Department: Payer: No Typology Code available for payment source

## 2019-05-28 ENCOUNTER — Other Ambulatory Visit: Payer: Self-pay

## 2019-05-28 ENCOUNTER — Telehealth: Payer: Self-pay | Admitting: *Deleted

## 2019-05-28 VITALS — BP 108/73 | HR 93 | Temp 97.7°F | Resp 18

## 2019-05-28 DIAGNOSIS — G4733 Obstructive sleep apnea (adult) (pediatric): Secondary | ICD-10-CM | POA: Diagnosis present

## 2019-05-28 DIAGNOSIS — E785 Hyperlipidemia, unspecified: Secondary | ICD-10-CM | POA: Diagnosis present

## 2019-05-28 DIAGNOSIS — B1920 Unspecified viral hepatitis C without hepatic coma: Secondary | ICD-10-CM | POA: Diagnosis present

## 2019-05-28 DIAGNOSIS — R7989 Other specified abnormal findings of blood chemistry: Secondary | ICD-10-CM

## 2019-05-28 DIAGNOSIS — E119 Type 2 diabetes mellitus without complications: Secondary | ICD-10-CM | POA: Diagnosis present

## 2019-05-28 DIAGNOSIS — J449 Chronic obstructive pulmonary disease, unspecified: Secondary | ICD-10-CM | POA: Diagnosis present

## 2019-05-28 DIAGNOSIS — G893 Neoplasm related pain (acute) (chronic): Secondary | ICD-10-CM | POA: Diagnosis not present

## 2019-05-28 DIAGNOSIS — R0682 Tachypnea, not elsewhere classified: Secondary | ICD-10-CM | POA: Diagnosis not present

## 2019-05-28 DIAGNOSIS — Z20822 Contact with and (suspected) exposure to covid-19: Secondary | ICD-10-CM | POA: Diagnosis present

## 2019-05-28 DIAGNOSIS — E872 Acidosis: Secondary | ICD-10-CM | POA: Diagnosis not present

## 2019-05-28 DIAGNOSIS — S36119A Unspecified injury of liver, initial encounter: Secondary | ICD-10-CM | POA: Diagnosis present

## 2019-05-28 DIAGNOSIS — I10 Essential (primary) hypertension: Secondary | ICD-10-CM | POA: Diagnosis present

## 2019-05-28 DIAGNOSIS — R0902 Hypoxemia: Secondary | ICD-10-CM | POA: Diagnosis not present

## 2019-05-28 DIAGNOSIS — Z6841 Body Mass Index (BMI) 40.0 and over, adult: Secondary | ICD-10-CM | POA: Diagnosis not present

## 2019-05-28 DIAGNOSIS — R6521 Severe sepsis with septic shock: Secondary | ICD-10-CM | POA: Diagnosis present

## 2019-05-28 DIAGNOSIS — Z66 Do not resuscitate: Secondary | ICD-10-CM | POA: Diagnosis not present

## 2019-05-28 DIAGNOSIS — C787 Secondary malignant neoplasm of liver and intrahepatic bile duct: Secondary | ICD-10-CM | POA: Diagnosis present

## 2019-05-28 DIAGNOSIS — E874 Mixed disorder of acid-base balance: Secondary | ICD-10-CM | POA: Diagnosis present

## 2019-05-28 DIAGNOSIS — K219 Gastro-esophageal reflux disease without esophagitis: Secondary | ICD-10-CM | POA: Diagnosis present

## 2019-05-28 DIAGNOSIS — J9602 Acute respiratory failure with hypercapnia: Secondary | ICD-10-CM | POA: Diagnosis present

## 2019-05-28 DIAGNOSIS — J9601 Acute respiratory failure with hypoxia: Secondary | ICD-10-CM | POA: Diagnosis present

## 2019-05-28 DIAGNOSIS — J9600 Acute respiratory failure, unspecified whether with hypoxia or hypercapnia: Secondary | ICD-10-CM | POA: Diagnosis not present

## 2019-05-28 DIAGNOSIS — G9340 Encephalopathy, unspecified: Secondary | ICD-10-CM | POA: Diagnosis not present

## 2019-05-28 DIAGNOSIS — E7800 Pure hypercholesterolemia, unspecified: Secondary | ICD-10-CM | POA: Diagnosis present

## 2019-05-28 DIAGNOSIS — R0602 Shortness of breath: Secondary | ICD-10-CM | POA: Diagnosis present

## 2019-05-28 DIAGNOSIS — C155 Malignant neoplasm of lower third of esophagus: Secondary | ICD-10-CM

## 2019-05-28 DIAGNOSIS — C7951 Secondary malignant neoplasm of bone: Secondary | ICD-10-CM | POA: Diagnosis present

## 2019-05-28 DIAGNOSIS — Z515 Encounter for palliative care: Secondary | ICD-10-CM

## 2019-05-28 DIAGNOSIS — F411 Generalized anxiety disorder: Secondary | ICD-10-CM | POA: Diagnosis present

## 2019-05-28 DIAGNOSIS — Z79899 Other long term (current) drug therapy: Secondary | ICD-10-CM | POA: Diagnosis not present

## 2019-05-28 DIAGNOSIS — J189 Pneumonia, unspecified organism: Secondary | ICD-10-CM | POA: Diagnosis present

## 2019-05-28 DIAGNOSIS — Z8249 Family history of ischemic heart disease and other diseases of the circulatory system: Secondary | ICD-10-CM | POA: Diagnosis not present

## 2019-05-28 DIAGNOSIS — F419 Anxiety disorder, unspecified: Secondary | ICD-10-CM | POA: Diagnosis not present

## 2019-05-28 DIAGNOSIS — Z923 Personal history of irradiation: Secondary | ICD-10-CM | POA: Diagnosis not present

## 2019-05-28 DIAGNOSIS — A419 Sepsis, unspecified organism: Principal | ICD-10-CM | POA: Diagnosis present

## 2019-05-28 DIAGNOSIS — C159 Malignant neoplasm of esophagus, unspecified: Secondary | ICD-10-CM | POA: Diagnosis not present

## 2019-05-28 LAB — CBC WITH DIFFERENTIAL/PLATELET
Abs Immature Granulocytes: 0.12 10*3/uL — ABNORMAL HIGH (ref 0.00–0.07)
Basophils Absolute: 0 10*3/uL (ref 0.0–0.1)
Basophils Relative: 0 %
Eosinophils Absolute: 0.1 10*3/uL (ref 0.0–0.5)
Eosinophils Relative: 1 %
HCT: 38.6 % — ABNORMAL LOW (ref 39.0–52.0)
Hemoglobin: 13 g/dL (ref 13.0–17.0)
Immature Granulocytes: 2 %
Lymphocytes Relative: 15 %
Lymphs Abs: 1.2 10*3/uL (ref 0.7–4.0)
MCH: 30.7 pg (ref 26.0–34.0)
MCHC: 33.7 g/dL (ref 30.0–36.0)
MCV: 91.3 fL (ref 80.0–100.0)
Monocytes Absolute: 1.2 10*3/uL — ABNORMAL HIGH (ref 0.1–1.0)
Monocytes Relative: 14 %
Neutro Abs: 5.7 10*3/uL (ref 1.7–7.7)
Neutrophils Relative %: 68 %
Platelets: 143 10*3/uL — ABNORMAL LOW (ref 150–400)
RBC: 4.23 MIL/uL (ref 4.22–5.81)
RDW: 13.5 % (ref 11.5–15.5)
WBC: 8.3 10*3/uL (ref 4.0–10.5)
nRBC: 0 % (ref 0.0–0.2)

## 2019-05-28 LAB — LACTIC ACID, PLASMA
Lactic Acid, Venous: 3.7 mmol/L (ref 0.5–1.9)
Lactic Acid, Venous: 3.1 mmol/L (ref 0.5–1.9)

## 2019-05-28 LAB — PROTIME-INR
INR: 1.2 (ref 0.8–1.2)
Prothrombin Time: 15.2 seconds (ref 11.4–15.2)

## 2019-05-28 LAB — APTT: aPTT: 31 seconds (ref 24–36)

## 2019-05-28 LAB — COMPREHENSIVE METABOLIC PANEL
ALT: 108 U/L — ABNORMAL HIGH (ref 0–44)
AST: 201 U/L — ABNORMAL HIGH (ref 15–41)
Albumin: 3.5 g/dL (ref 3.5–5.0)
Alkaline Phosphatase: 300 U/L — ABNORMAL HIGH (ref 38–126)
Anion gap: 13 (ref 5–15)
BUN: 19 mg/dL (ref 6–20)
CO2: 22 mmol/L (ref 22–32)
Calcium: 9.4 mg/dL (ref 8.9–10.3)
Chloride: 102 mmol/L (ref 98–111)
Creatinine, Ser: 1.06 mg/dL (ref 0.61–1.24)
GFR calc Af Amer: >60 mL/min (ref >60)
GFR calc non Af Amer: >60 mL/min (ref >60)
Glucose, Bld: 120 mg/dL — ABNORMAL HIGH (ref 70–99)
Potassium: 4.8 mmol/L (ref 3.5–5.1)
Sodium: 137 mmol/L (ref 135–145)
Total Bilirubin: 2 mg/dL — ABNORMAL HIGH (ref 0.3–1.2)
Total Protein: 7.6 g/dL (ref 6.5–8.1)

## 2019-05-28 LAB — RESPIRATORY PANEL BY RT PCR (FLU A&B, COVID)
Influenza A by PCR: NEGATIVE
Influenza B by PCR: NEGATIVE
SARS Coronavirus 2 by RT PCR: NEGATIVE

## 2019-05-28 LAB — TROPONIN I (HIGH SENSITIVITY)
Troponin I (High Sensitivity): 56 ng/L — ABNORMAL HIGH (ref <18)
Troponin I (High Sensitivity): 47 ng/L — ABNORMAL HIGH (ref <18)

## 2019-05-28 LAB — BRAIN NATRIURETIC PEPTIDE: B Natriuretic Peptide: 24 pg/mL (ref 0.0–100.0)

## 2019-05-28 LAB — LIPASE, BLOOD: Lipase: 30 U/L (ref 11–51)

## 2019-05-28 MED ORDER — ACETAMINOPHEN 325 MG PO TABS: 650.0000 mg | ORAL_TABLET | Freq: Four times a day (QID) | ORAL | Status: DC | PRN

## 2019-05-28 MED ORDER — SODIUM CHLORIDE 0.9 % IV BOLUS
1000.0000 mL | Freq: Once | INTRAVENOUS | Status: AC
  Administered 2019-05-28: 1000 mL via INTRAVENOUS

## 2019-05-28 MED ORDER — ADULT MULTIVITAMIN W/MINERALS CH
1.0000 | ORAL_TABLET | Freq: Every day | ORAL | Status: DC
  Administered 2019-05-29 – 2019-06-01 (×4): 1 via ORAL
  Filled 2019-05-28 (×4): qty 1

## 2019-05-28 MED ORDER — TURMERIC 500 MG PO CAPS: 500.0000 mg | ORAL_CAPSULE | Freq: Every day | ORAL | Status: DC

## 2019-05-28 MED ORDER — SODIUM CHLORIDE 0.9 % IV SOLN: INTRAVENOUS | Status: DC

## 2019-05-28 MED ORDER — IOHEXOL 350 MG/ML SOLN
75.0000 mL | Freq: Once | INTRAVENOUS | Status: AC | PRN
  Administered 2019-05-28: 75 mL via INTRAVENOUS

## 2019-05-28 MED ORDER — LISINOPRIL 20 MG PO TABS
20.0000 mg | ORAL_TABLET | Freq: Every day | ORAL | Status: DC
  Administered 2019-05-29 – 2019-06-01 (×4): 20 mg via ORAL
  Filled 2019-05-28: qty 2
  Filled 2019-05-28 (×3): qty 1

## 2019-05-28 MED ORDER — TRAZODONE HCL 50 MG PO TABS
25.0000 mg | ORAL_TABLET | Freq: Every evening | ORAL | Status: DC | PRN
  Administered 2019-05-28 – 2019-05-30 (×3): 25 mg via ORAL
  Filled 2019-05-28 (×3): qty 1

## 2019-05-28 MED ORDER — HYDROCOD POLST-CPM POLST ER 10-8 MG/5ML PO SUER: 5.0000 mL | Freq: Two times a day (BID) | ORAL | Status: DC | PRN

## 2019-05-28 MED ORDER — OMEGA-3-ACID ETHYL ESTERS 1 G PO CAPS
1.0000 g | ORAL_CAPSULE | Freq: Every day | ORAL | Status: DC
  Administered 2019-05-29 – 2019-06-01 (×4): 1 g via ORAL
  Filled 2019-05-28 (×4): qty 1

## 2019-05-28 MED ORDER — SODIUM CHLORIDE 0.9 % IV SOLN
500.0000 mg | INTRAVENOUS | Status: DC
  Administered 2019-05-29 – 2019-05-30 (×2): 500 mg via INTRAVENOUS
  Filled 2019-05-28 (×3): qty 500

## 2019-05-28 MED ORDER — ACETAMINOPHEN 650 MG RE SUPP: 650.0000 mg | Freq: Four times a day (QID) | RECTAL | Status: DC | PRN

## 2019-05-28 MED ORDER — IPRATROPIUM-ALBUTEROL 0.5-2.5 (3) MG/3ML IN SOLN
3.0000 mL | Freq: Four times a day (QID) | RESPIRATORY_TRACT | Status: DC
  Administered 2019-05-28 – 2019-05-31 (×8): 3 mL via RESPIRATORY_TRACT
  Filled 2019-05-28 (×12): qty 3

## 2019-05-28 MED ORDER — ENOXAPARIN SODIUM 40 MG/0.4ML ~~LOC~~ SOLN
40.0000 mg | Freq: Two times a day (BID) | SUBCUTANEOUS | Status: DC
  Administered 2019-05-28 – 2019-06-01 (×8): 40 mg via SUBCUTANEOUS
  Filled 2019-05-28 (×7): qty 0.4

## 2019-05-28 MED ORDER — SODIUM CHLORIDE 0.9 % IV SOLN
500.0000 mg | Freq: Once | INTRAVENOUS | Status: AC
  Administered 2019-05-28: 19:00:00 500 mg via INTRAVENOUS
  Filled 2019-05-28: qty 500

## 2019-05-28 MED ORDER — PANTOPRAZOLE SODIUM 40 MG PO TBEC
40.0000 mg | DELAYED_RELEASE_TABLET | Freq: Every day | ORAL | Status: DC
  Administered 2019-05-29 – 2019-06-01 (×4): 40 mg via ORAL
  Filled 2019-05-28 (×4): qty 1

## 2019-05-28 MED ORDER — MORPHINE SULFATE (PF) 2 MG/ML IV SOLN
2.0000 mg | INTRAVENOUS | Status: DC | PRN
  Administered 2019-05-28 – 2019-05-30 (×3): 2 mg via INTRAVENOUS
  Filled 2019-05-28 (×3): qty 1

## 2019-05-28 MED ORDER — SODIUM CHLORIDE 0.9 % IV SOLN
2.0000 g | Freq: Once | INTRAVENOUS | Status: AC
  Administered 2019-05-28: 19:00:00 2 g via INTRAVENOUS
  Filled 2019-05-28: qty 20

## 2019-05-28 MED ORDER — ATORVASTATIN CALCIUM 20 MG PO TABS: 20.0000 mg | ORAL_TABLET | Freq: Every day | ORAL | Status: DC

## 2019-05-28 MED ORDER — SODIUM CHLORIDE 0.9 % IV SOLN
2.0000 g | INTRAVENOUS | Status: DC
  Administered 2019-05-29 – 2019-05-30 (×2): 2 g via INTRAVENOUS
  Filled 2019-05-28 (×2): qty 2
  Filled 2019-05-28: qty 20

## 2019-05-28 MED ORDER — GUAIFENESIN ER 600 MG PO TB12
600.0000 mg | ORAL_TABLET | Freq: Two times a day (BID) | ORAL | Status: DC
  Administered 2019-05-28 – 2019-06-01 (×8): 600 mg via ORAL
  Filled 2019-05-28 (×9): qty 1

## 2019-05-28 MED ORDER — MAGNESIUM HYDROXIDE 400 MG/5ML PO SUSP
30.0000 mL | Freq: Every day | ORAL | Status: DC | PRN
  Filled 2019-05-28: qty 30

## 2019-05-28 MED ORDER — ONDANSETRON HCL 4 MG/2ML IJ SOLN
4.0000 mg | Freq: Four times a day (QID) | INTRAMUSCULAR | Status: DC | PRN
  Administered 2019-05-28 – 2019-06-01 (×3): 4 mg via INTRAVENOUS
  Filled 2019-05-28 (×3): qty 2

## 2019-05-28 MED ORDER — ONDANSETRON HCL 4 MG PO TABS
4.0000 mg | ORAL_TABLET | Freq: Four times a day (QID) | ORAL | Status: DC | PRN
  Administered 2019-05-30: 4 mg via ORAL
  Filled 2019-05-28: qty 1

## 2019-05-28 NOTE — H&P (Addendum)
[facility] [place] NAME: Matthew Yang    MR#:  [id number]  DATE OF BIRTH:  08-02-1960  DATE OF ADMISSION:  05/28/2019  PRIMARY CARE PHYSICIAN: Kirk Ruths, MD   REQUESTING/REFERRING PHYSICIAN: Duffy Bruce, MD  CHIEF COMPLAINT:   Chief Complaint  Patient presents with  . Shortness of Breath    HISTORY OF PRESENT ILLNESS:  Matthew Yang  is a 59 y.o. obese Caucasian male with a known history of stage IV metastatic esophageal cancer, type diabetes mellitus, hypertension and dyslipidemia, who presented to the emergency room with acute onset of worsening dyspnea with associated cough productive of whitish sputum and occasional wheezing over the last couple days.  He admitted to chills but did not have any measured fever.  He has been having palpitations.  No nausea or vomiting or diarrhea.  He has occasional abdominal pain which is chronic.  No dysuria, oliguria or hematuria or flank pain.  No chest pain or or hemoptysis.  No rhinorrhea or nasal congestion or sore throat.  Upon presentation to the emergency room, vital signs were within normal.  Pulse oximetry was 92% on room air later on in then 95% on 2 L of O2 by nasal cannula.  His blood pressure is also dropped to 76/57 with a heart rate of 130 and respiratory to 22.  That came up with hydration to 106/68 with a pulse of 109 and respiratory rate is been ranging from 19-28.  Labs revealed elevated alkaline phosphatase and AST ALT with total bili of 2.  BNP was 24 and high-sensitivity troponin was 56.  Lactic acid was 3.7 and CBC was unremarkable.  Influenza antigens in COVID-19 PCR came back negative.  Portable chest ray showed mild atelectasis and/or infiltrate in the right lung base and prominent small bowel loops with inability to exclude ileus versus partial small bowel obstruction.  Chest and abdominal CT scan revealed findings consistent with right lower lobe pneumonia and associated with reactive  mild interstitial edema and interval development of thoracic and retroperitoneal significant adenopathy and extensive hepatic metastasis since 02/19/2019.  He also showed cirrhosis with small ascites and aortic atherosclerosis.EKG showed sinus tachycardia with rate 134.  The patient was given IV Rocephin and Zithromax and 3 L bolus of IV normal saline.  He will be admitted to a medical monitored bed for further evaluation and management PAST MEDICAL HISTORY:   Past Medical History:  Diagnosis Date  . Diabetes mellitus without complication ([place])   . Esophagus cancer ([place])   . Hepatitis    HEPATITIS C  . Hypercholesteremia   . Hypertension   . Morbid obesity ([place])   . Positive hepatitis C antibody test   . Sleep apnea   . Tubular adenoma of colon    polyp    PAST SURGICAL HISTORY:   Past Surgical History:  Procedure Laterality Date  . CHOLECYSTECTOMY    . COLONOSCOPY    . COLONOSCOPY WITH PROPOFOL N/A 05/04/2016   Procedure: COLONOSCOPY WITH PROPOFOL;  Surgeon: Lollie Sails, MD;  Location: [facility] ENDOSCOPY;  Service: Endoscopy;  Laterality: N/A;  . ESOPHAGOGASTRODUODENOSCOPY N/A 05/03/2019   Procedure: ESOPHAGOGASTRODUODENOSCOPY (EGD);  Surgeon: Toledo, Benay Pike, MD;  Location: [facility] ENDOSCOPY;  Service: Gastroenterology;  Laterality: N/A;  . ESOPHAGOGASTRODUODENOSCOPY (EGD) WITH PROPOFOL N/A 08/04/2018   Procedure: ESOPHAGOGASTRODUODENOSCOPY (EGD) WITH PROPOFOL;  Surgeon: Lollie Sails, MD;  Location: [facility] ENDOSCOPY;  Service: Endoscopy;  Laterality: N/A;  . PORTA CATH INSERTION N/A 08/17/2018  Procedure: PORTA CATH INSERTION;  Surgeon: Algernon Huxley, MD;  Location: [facility] LAB;  Service: Cardiovascular;  Laterality: N/A;    SOCIAL HISTORY:   Social History   Tobacco Use  . Smoking status: Never Smoker  . Smokeless tobacco: Never Used  Substance Use Topics  . Alcohol use: Not Currently    FAMILY HISTORY:   Positive for coronary artery disease in his  grandmother.  DRUG ALLERGIES:  No Known Allergies  REVIEW OF SYSTEMS:   ROS As per history of present illness. All pertinent systems were reviewed above. Constitutional,  HEENT, cardiovascular, respiratory, GI, GU, musculoskeletal, neuro, psychiatric, endocrine,  integumentary and hematologic systems were reviewed and are otherwise  negative/unremarkable except for positive findings mentioned above in the HPI.   MEDICATIONS AT HOME:   Prior to Admission medications   Medication Sig Start Date End Date Taking? Authorizing Provider  atorvastatin (LIPITOR) 20 MG tablet Take 20 mg by mouth at bedtime.    Yes [provider]  doxycycline (VIBRAMYCIN) 100 MG capsule Take 100 mg by mouth 2 (two) times daily.   Yes [provider]  lisinopril (PRINIVIL,ZESTRIL) 20 MG tablet Take 20 mg by mouth daily.   Yes [provider]  Multiple Vitamin (MULTIVITAMIN WITH MINERALS) TABS tablet Take 1 tablet by mouth daily.   Yes [provider]  Omega-3 Fatty Acids (FISH OIL) 1000 MG CAPS Take 1,000 mg by mouth daily.    Yes [provider]  pantoprazole (PROTONIX) 40 MG tablet Take 40 mg by mouth daily.   Yes [provider]  Turmeric 500 MG CAPS Take 500 mg by mouth daily.   Yes [provider]  lidocaine-prilocaine (EMLA) cream Apply 1 application topically as needed. 05/22/19   Cammie Sickle, MD  ondansetron (ZOFRAN) 8 MG tablet Take 1 tablet (8 mg total) by mouth every 8 (eight) hours as needed for nausea or vomiting. Patient not taking: Reported on 05/28/2019 08/23/18   Cammie Sickle, MD  prochlorperazine (COMPAZINE) 10 MG tablet Take 1 tablet (10 mg total) by mouth every 6 (six) hours as needed for nausea or vomiting. Patient not taking: Reported on 05/28/2019 08/23/18   Cammie Sickle, MD      VITAL SIGNS:  Blood pressure 106/68, pulse (!) 109, temperature 98.7 F (37.1 C), resp. rate (!) 29, height 5\' 10"  (1.778  m), weight 132.9 kg, SpO2 93 %.  PHYSICAL EXAMINATION:  Physical Exam  GENERAL:  59 y.o.-year-old obese Caucasian male patient lying in the bed with mild respiratory distress with conversational dyspnea. EYES: Pupils equal, round, reactive to light and accommodation. No scleral icterus. Extraocular muscles intact.  HEENT: Head atraumatic, normocephalic. Oropharynx and nasopharynx clear.  NECK:  Supple, no jugular venous distention. No thyroid enlargement, no tenderness.  LUNGS: Diminished right basal breath sounds. CARDIOVASCULAR: Regular rate and rhythm, S1, S2 normal. No murmurs, rubs, or gallops.  ABDOMEN: Soft, nondistended, nontender. Bowel sounds present. No organomegaly or mass.  EXTREMITIES: No pedal edema, cyanosis, or clubbing.  NEUROLOGIC: Cranial nerves II through XII are intact. Muscle strength 5/5 in all extremities. Sensation intact. Gait not checked.  PSYCHIATRIC: The patient is alert and oriented x 3.  Normal affect and good eye contact. SKIN: No obvious rash, lesion, or ulcer.   LABORATORY PANEL:   CBC Recent Labs  Lab 05/28/19 1621  WBC 8.3  HGB 13.0  HCT 38.6*  PLT 143*   ------------------------------------------------------------------------------------------------------------------  Chemistries  Recent Labs  Lab 05/28/19 1621  NA 137  K 4.8  CL 102  CO2 22  GLUCOSE 120*  BUN 19  CREATININE 1.06  CALCIUM 9.4  AST 201*  ALT 108*  ALKPHOS 300*  BILITOT 2.0*   ------------------------------------------------------------------------------------------------------------------  Cardiac Enzymes No results for input(s): TROPONINI in the last 168 hours. ------------------------------------------------------------------------------------------------------------------  RADIOLOGY:  CT Angio Chest PE W/Cm &/Or Wo Cm  Result Date: 05/28/2019 CLINICAL DATA:  59 year old male with shortness of breath and generalized pain. History of esophageal cancer.  EXAM: CT ANGIOGRAPHY CHEST CT ABDOMEN AND PELVIS WITH CONTRAST TECHNIQUE: Multidetector CT imaging of the chest was performed using the standard protocol during bolus administration of intravenous contrast. Multiplanar CT image reconstructions and MIPs were obtained to evaluate the vascular anatomy. Multidetector CT imaging of the abdomen and pelvis was performed using the standard protocol during bolus administration of intravenous contrast. CONTRAST:  38mL OMNIPAQUE IOHEXOL 350 MG/ML SOLN COMPARISON:  CT of the chest abdomen pelvis dated 02/19/2019. PET CT dated 05/21/2019. FINDINGS: CTA CHEST FINDINGS Cardiovascular: There is no cardiomegaly or pericardial effusion. Advanced 3 vessel coronary vascular calcification. There is mild atherosclerotic calcification of the thoracic aorta. No aneurysmal dilatation or dissection. Partially visualized Port-A-Cath with tip in the central SVC close to the cavoatrial junction. Evaluation of the pulmonary arteries is very limited due to respiratory motion artifact and suboptimal opacification and timing of the contrast. No large or central pulmonary artery embolus identified. Mediastinum/Nodes: Bilateral hilar and mediastinal adenopathy new since the CT of 02/19/2019. Right hilar lymph nodes measure up to 2.3 cm in short axis. Subcarinal adenopathy measures 2.5 cm in short axis. The esophagus is grossly unremarkable but suboptimally visualized due to mediastinal adenopathy. No mediastinal fluid collection. Lungs/Pleura: Faint area of opacity at the right lung base similar to the PET-CT of 05/21/2019 most consistent with pneumonia. Clinical correlation and follow-up recommended. There is diffuse interstitial and interlobular septal thickening of the right lung with mild nodularity most likely infectious or inflammatory in etiology. There is probably mild associated or reactive interstitial edema in the right lung. The left lung is clear. There is no pleural effusion or  pneumothorax. The central airways are patent. Musculoskeletal: Partially visualized right supraclavicular lymph node measures approximately 14 mm (series 4, image 1). Mildly rounded lymph node in the posterior neck deep to the right thyroid lobe (series 4, image 4) measures approximately 12 mm. No definite axillary adenopathy. Mild degenerative changes of the spine. No acute osseous pathology. Review of the MIP images confirms the above findings. CT ABDOMEN and PELVIS FINDINGS No intra-abdominal free air. Small ascites. Hepatobiliary: Cirrhosis. Innumerable hepatic hypodense lesions, new since the CT of 02/19/2019 consistent with metastatic disease. The largest lesion or confluent of lesions in the right lobe of the liver measures approximately 6.7 x 7.7 cm. No intrahepatic biliary ductal dilatation. Cholecystectomy. Pancreas: The pancreas is unremarkable as visualized. Spleen: Normal in size without focal abnormality. Adrenals/Urinary Tract: The adrenal glands are unremarkable. There is no hydronephrosis on either side. There is symmetric enhancement and excretion of contrast by both kidneys. The visualized ureters and urinary bladder appear unremarkable. Stomach/Bowel: There is no bowel obstruction or active inflammation. The appendix is normal. Vascular/Lymphatic: Mild aortoiliac atherosclerotic disease. The IVC is unremarkable. No portal venous gas. Retroperitoneal and porta hepatic adenopathy, new since the prior CT of 02/19/2019. For example an enlarged periportal lymph node measures 2 cm in short axis. Retrocaval lymph node measures 2.1 cm in short axis. Reproductive: The prostate and seminal vesicles are grossly unremarkable. Other: None  Musculoskeletal: Multilevel degenerative changes of the spine with disc desiccation and vacuum phenomena. No acute osseous pathology. Review of the MIP images confirms the above findings. IMPRESSION: 1. Findings most consistent with right lower lobe pneumonia and associated  reactive mild interstitial edema. Clinical correlation and follow-up to resolution recommended. 2. Interval development of significant thoracic, and retroperitoneal adenopathy as well as extensive hepatic metastatic disease since the CT of 02/19/2019. 3. Cirrhosis with small ascites. 4. Aortic Atherosclerosis (ICD10-I70.0). Electronically Signed   By: Anner Crete M.D.   On: 05/28/2019 18:00   CT ABDOMEN PELVIS W CONTRAST  Result Date: 05/28/2019 CLINICAL DATA:  59 year old male with shortness of breath and generalized pain. History of esophageal cancer. EXAM: CT ANGIOGRAPHY CHEST CT ABDOMEN AND PELVIS WITH CONTRAST TECHNIQUE: Multidetector CT imaging of the chest was performed using the standard protocol during bolus administration of intravenous contrast. Multiplanar CT image reconstructions and MIPs were obtained to evaluate the vascular anatomy. Multidetector CT imaging of the abdomen and pelvis was performed using the standard protocol during bolus administration of intravenous contrast. CONTRAST:  27mL OMNIPAQUE IOHEXOL 350 MG/ML SOLN COMPARISON:  CT of the chest abdomen pelvis dated 02/19/2019. PET CT dated 05/21/2019. FINDINGS: CTA CHEST FINDINGS Cardiovascular: There is no cardiomegaly or pericardial effusion. Advanced 3 vessel coronary vascular calcification. There is mild atherosclerotic calcification of the thoracic aorta. No aneurysmal dilatation or dissection. Partially visualized Port-A-Cath with tip in the central SVC close to the cavoatrial junction. Evaluation of the pulmonary arteries is very limited due to respiratory motion artifact and suboptimal opacification and timing of the contrast. No large or central pulmonary artery embolus identified. Mediastinum/Nodes: Bilateral hilar and mediastinal adenopathy new since the CT of 02/19/2019. Right hilar lymph nodes measure up to 2.3 cm in short axis. Subcarinal adenopathy measures 2.5 cm in short axis. The esophagus is grossly unremarkable  but suboptimally visualized due to mediastinal adenopathy. No mediastinal fluid collection. Lungs/Pleura: Faint area of opacity at the right lung base similar to the PET-CT of 05/21/2019 most consistent with pneumonia. Clinical correlation and follow-up recommended. There is diffuse interstitial and interlobular septal thickening of the right lung with mild nodularity most likely infectious or inflammatory in etiology. There is probably mild associated or reactive interstitial edema in the right lung. The left lung is clear. There is no pleural effusion or pneumothorax. The central airways are patent. Musculoskeletal: Partially visualized right supraclavicular lymph node measures approximately 14 mm (series 4, image 1). Mildly rounded lymph node in the posterior neck deep to the right thyroid lobe (series 4, image 4) measures approximately 12 mm. No definite axillary adenopathy. Mild degenerative changes of the spine. No acute osseous pathology. Review of the MIP images confirms the above findings. CT ABDOMEN and PELVIS FINDINGS No intra-abdominal free air. Small ascites. Hepatobiliary: Cirrhosis. Innumerable hepatic hypodense lesions, new since the CT of 02/19/2019 consistent with metastatic disease. The largest lesion or confluent of lesions in the right lobe of the liver measures approximately 6.7 x 7.7 cm. No intrahepatic biliary ductal dilatation. Cholecystectomy. Pancreas: The pancreas is unremarkable as visualized. Spleen: Normal in size without focal abnormality. Adrenals/Urinary Tract: The adrenal glands are unremarkable. There is no hydronephrosis on either side. There is symmetric enhancement and excretion of contrast by both kidneys. The visualized ureters and urinary bladder appear unremarkable. Stomach/Bowel: There is no bowel obstruction or active inflammation. The appendix is normal. Vascular/Lymphatic: Mild aortoiliac atherosclerotic disease. The IVC is unremarkable. No portal venous gas.  Retroperitoneal and porta hepatic adenopathy, new since  the prior CT of 02/19/2019. For example an enlarged periportal lymph node measures 2 cm in short axis. Retrocaval lymph node measures 2.1 cm in short axis. Reproductive: The prostate and seminal vesicles are grossly unremarkable. Other: None Musculoskeletal: Multilevel degenerative changes of the spine with disc desiccation and vacuum phenomena. No acute osseous pathology. Review of the MIP images confirms the above findings. IMPRESSION: 1. Findings most consistent with right lower lobe pneumonia and associated reactive mild interstitial edema. Clinical correlation and follow-up to resolution recommended. 2. Interval development of significant thoracic, and retroperitoneal adenopathy as well as extensive hepatic metastatic disease since the CT of 02/19/2019. 3. Cirrhosis with small ascites. 4. Aortic Atherosclerosis (ICD10-I70.0). Electronically Signed   By: Anner Crete M.D.   On: 05/28/2019 18:00   DG Chest Port 1 View  Result Date: 05/28/2019 CLINICAL DATA:  Generalized aches with shortness of breath. EXAM: PORTABLE CHEST 1 VIEW COMPARISON:  April 27, 2010 FINDINGS: A right-sided venous Port-A-Cath is seen with its distal tip overlying the distal aspect of the superior vena cava. Mild atelectasis and/or infiltrate is seen within the right lung base. There is no evidence of a pleural effusion or pneumothorax. The heart size and mediastinal contours are within normal limits. Degenerative changes seen throughout the thoracic spine. Prominent small bowel loops are suspected within the visualized portion of the upper abdomen. IMPRESSION: 1. Mild atelectasis and/or infiltrate within the right lung base. 2. Prominent small bowel loops within the upper abdomen. Sequelae associated with a partial small bowel obstruction versus ileus cannot be excluded. Correlation with abdominal plain films is recommended. Electronically Signed   By: Virgina Norfolk M.D.    On: 05/28/2019 17:08      IMPRESSION AND PLAN:   1.  Right lower lobe community-acquired pneumonia differential diagnosis including streptococcal or atypicals. -The patient will be admitted to a medical monitored bed. -We will continue direct therapy with IV Rocephin and Zithromax and hold off doxycycline. -We will follow blood and sputum culture as well as pneumonia antigens. -O2 protocol will be followed. -Mucolytics, as needed and scheduled duo nebs will be provided.  2.  Sepsis, likely secondary to #1.  This manifested by hypotension, tachycardia and tachypnea.  I believe he does not have severe sepsis or septic shock. -We will follow lactic acid level as well as blood and sputum culture. -Continue IV antibiotics as mentioned above. -We will continue hydration with IV normal saline.  3.  Mild acute hypoxic respiratory failure secondary to #1. -O2 protocol will be followed as mentioned above.  4.  Dyslipidemia. -We will hold off statin therapy given elevated LFTs.  5.  Hypertension. -Continue Zestril.  6.  GERD. -We will continue PPI therapy.  7.  Metastatic esophageal cancer. -Pain management will be provided.  8.  DVT prophylaxis. -Subcutaneous Lovenox.  All the records are reviewed and case discussed with ED provider. The plan of care was discussed in details with the patient (and family). I answered all questions. The patient agreed to proceed with the above mentioned plan. Further management will   CODE STATUS: This was discussed with the patient and his wife.  He desires to be full code.  The patient is admitted to inpatient status due to the intensity of medical problems and management requirement that will necessitate more than 2 midnights.  TOTAL TIME TAKING CARE OF THIS PATIENT: 55 minutes.    Christel Mormon M.D on 05/28/2019 at 6:36 PM  [facility]   From 7 PM-7 AM,  contact night-coverage [email or web address]  CC: Primary care physician; Kirk Ruths, MD   Note: This dictation was prepared with Dragon dictation along with smaller phrase technology. Any transcriptional errors that result from this process are unintentional.

## 2019-05-28 NOTE — ED Notes (Signed)
Pt was given a meal tray and a beverage per request.   RT at bedside to apply CPAP at this time

## 2019-05-28 NOTE — Progress Notes (Signed)
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  # June rd week- 5FU pump every 2 days; +RT [until aug 4th]  # 10/16/2018-FOLFOX #1; 9/21-PET- PR. 11/28/2018- cycle #6-decreased the ox dose to 60 mg/2 [sec to platelets- 94]. S/p 12 cycles of FOLFOX; DEC 15th 2020- switched to 5 CIV ONLY; dced Oxaliplatin.   # MARCH 15th 2021-progression of disease-liver mets bone mets/thoracic abdominal retroperitoneal lymph nodes  # MARCH 23,2021- TAX+RAM   # MARCH 2021- Bone mets- Zometa  # DM- diet controlled; Hepatitis C; September 2020-right kidney stone/spontaneous; foreign body impaction/spontaneous  # EGD-[Dr.Toledo] FEB 2021-complete response to tumor  # NGS/omniseq- PDL-1 CPS 20%;EGFR-copy number gain; NEG- targets/ Her-2 neu.   # PALLIATIVE CARE: P  DIAGNOSIS: Adenocarcinoma esophagus/GE junction  STAGE: IV     ;GOALS: pallaitive   CURRENT/MOST RECENT THERAPY: TAX+RAM [C]      Malignant neoplasm of lower third of esophagus ([place])  08/30/2018 - 05/21/2019 Chemotherapy   The patient had palonosetron (ALOXI) injection 0.25 mg, 0.25 mg, Intravenous,  Once, 9 of 9 cycles Administration: 0.25 mg (10/30/2018), 0.25 mg (10/16/2018), 0.25 mg (11/28/2018), 0.25 mg (12/12/2018), 0.25 mg (11/14/2018), 0.25 mg (12/26/2018), 0.25 mg (01/09/2019), 0.25 mg (01/23/2019), 0.25 mg (02/06/2019) leucovorin 1,072 mg in dextrose 5 % 250 mL infusion, 400 mg/m2 = 1,072 mg, Intravenous,  Once, 11 of 11 cycles Administration: 1,000 mg (10/30/2018), 1,000 mg (10/16/2018), 1,000 mg (11/28/2018), 1,000 mg (12/12/2018), 1,000 mg (12/26/2018), 1,000 mg (01/09/2019), 1,000 mg (01/23/2019), 1,000 mg (02/06/2019) oxaliplatin (ELOXATIN) 230 mg in dextrose 5 % 500 mL chemo infusion, 85 mg/m2 = 230 mg, Intravenous,  Once, 9 of 9 cycles Dose modification: 60 mg/m2 (original dose 85 mg/m2, Cycle 7, Reason: Provider Judgment) Administration: 230 mg (10/30/2018), 230 mg (10/16/2018), 160 mg (11/28/2018), 160 mg (12/12/2018), 230 mg (11/14/2018), 160 mg (12/26/2018), 160 mg (01/09/2019), 160 mg (01/23/2019), 160 mg (02/06/2019) leucovorin injection 54 mg, 20 mg/m2 = 54 mg (100 % of original dose 20 mg/m2), Intravenous,  Once, 8 of 8 cycles Dose modification: 20 mg/m2 (original dose 20 mg/m2, Cycle 2, Reason: Other (see comments), Comment: iv push), 20 mg/m2 (original dose 20 mg/m2, Cycle 3, Reason: Other (see comments), Comment: ivp) Administration: 54 mg (09/13/2018), 54 mg (10/02/2018) fluorouracil (ADRUCIL) 6,450 mg in sodium chloride 0.9 % 121 mL chemo infusion, 2,400 mg/m2 = 6,450 mg, Intravenous, 1 Day/Dose, 18 of 20 cycles Administration: 6,450 mg (08/30/2018), 6,450 mg (09/13/2018), 6,450 mg (10/30/2018), 6,450 mg (10/02/2018), 6,450 mg (10/16/2018), 6,450 mg (11/28/2018), 6,450 mg (12/12/2018), 6,450 mg (11/14/2018), 6,450 mg (12/26/2018), 6,450 mg (01/09/2019),  6,450 mg (01/23/2019), 6,450 mg  (02/06/2019), 6,450 mg (02/20/2019), 6,450 mg (03/06/2019), 6,450 mg (03/20/2019), 6,450 mg (04/03/2019), 6,450 mg (04/17/2019), 6,450 mg (05/08/2019)  for chemotherapy treatment.    05/30/2019 -  Chemotherapy   The patient had PACLitaxel (TAXOL) 204 mg in sodium chloride 0.9 % 250 mL chemo infusion (</= 19m/m2), 80 mg/m2, Intravenous,  Once, 0 of 6 cycles ramucirumab (CYRAMZA) 1,100 mg in sodium chloride 0.9 % 140 mL chemo infusion, 8 mg/kg, Intravenous, Once, 0 of 6 cycles  for chemotherapy treatment.     Interval history-patient presents to symptom management for complaints of shortness of breath, bilateral calf discomfort that radiates to his thighs and tachycardia.  Patient was recently evaluated by Dr. BRogue Bussingon  05/22/2019 to review PET scan.  PET scan unfortunately revealed significant progression of disease in his thoracic abdominal retroperitoneal adenopathy, liver metastasis and also bone metastasis in his vertebrae and ribs.  Dr. B recommended discontinuation of 5-FU and to switch treatment to Taxol plus Ram.   Patient states on Wednesday he developed intermittent shortness of breath mainly with exertion while at work.  On Friday he [place] his [place] where his shortness of breath became more profound.  He was evaluated at an urgent care and diagnosed and treated for possible pneumonia with doxycycline.  Symptoms worsened throughout the weekend.  He was unable to perform normal ADLs without significant shortness of breath. denies fever, cough, weight loss, chest pain, nausea, vomiting, constipation or diarrhea.  Admits to intermittent abdominal discomfort and bloating.  Developed bilateral calf pain which has been progressive as well which is describes as a squeezing sensation.   ECOG FS:2 - Symptomatic, <50% confined to bed  Review of systems- Review of Systems  Constitutional: Positive for malaise/fatigue. Negative for chills, fever and weight loss.  HENT: Negative for  congestion, ear pain and tinnitus.   Eyes: Negative.  Negative for blurred vision and double vision.  Respiratory: Positive for sputum production. Negative for cough and shortness of breath.   Cardiovascular: Positive for leg swelling. Negative for chest pain and palpitations.  Gastrointestinal: Negative.  Negative for abdominal pain, constipation, diarrhea, nausea and vomiting.  Genitourinary: Negative for dysuria, frequency and urgency.  Musculoskeletal: Negative for back pain and falls.       Calf pain-bilateral   Skin: Negative.  Negative for rash.  Neurological: Positive for weakness. Negative for headaches.  Endo/Heme/Allergies: Negative.  Does not bruise/bleed easily.  Psychiatric/Behavioral: Negative for depression. The patient is nervous/anxious. The patient does not have insomnia.      Current treatment- s/p 5 fu infusion-recently discontinued secondary to progression of disease  No Known Allergies   Past Medical History:  Diagnosis Date  . Diabetes mellitus without complication ([place]   . Esophagus cancer ([place]   . Hepatitis    HEPATITIS C  . Hypercholesteremia   . Hypertension   . Morbid obesity ([place]   . Positive hepatitis C antibody test   . Sleep apnea   . Tubular adenoma of colon    polyp     Past Surgical History:  Procedure Laterality Date  . CHOLECYSTECTOMY    . COLONOSCOPY    . COLONOSCOPY WITH PROPOFOL N/A 05/04/2016   Procedure: COLONOSCOPY WITH PROPOFOL;  Surgeon: MLollie Sails MD;  [facility];  Service: Endoscopy;  Laterality: N/A;  . ESOPHAGOGASTRODUODENOSCOPY N/A 05/03/2019   Procedure: ESOPHAGOGASTRODUODENOSCOPY (EGD);  Surgeon: Toledo, TBenay Pike MD;  Location: [facility] ENDOSCOPY;  Service: Gastroenterology;  Laterality: N/A;  .  ESOPHAGOGASTRODUODENOSCOPY (EGD) WITH PROPOFOL N/A 08/04/2018   Procedure: ESOPHAGOGASTRODUODENOSCOPY (EGD) WITH PROPOFOL;  Surgeon: Lollie Sails, MD;  Location: [facility];  Service: Endoscopy;   Laterality: N/A;  . PORTA CATH INSERTION N/A 08/17/2018   Procedure: PORTA CATH INSERTION;  Surgeon: Algernon Huxley, MD;  Location: [facility];  Service: Cardiovascular;  Laterality: N/A;    Social History   Socioeconomic History  . Marital status: Married    Spouse name: Not on file  . Number of children: Not on file  . Years of education: Not on file  . Highest education level: Not on file  Occupational History  . Not on file  Tobacco Use  . Smoking status: Never Smoker  . Smokeless tobacco: Never Used  Substance and Sexual Activity  . Alcohol use: Not Currently  . Drug use: No  . Sexual activity: Not on file  Other Topics Concern  . 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    Social Determinants of Health   Financial Resource Strain:   . Difficulty of Paying Living Expenses:   Food Insecurity:   . Worried About Charity fundraiser in the Last Year:   . Arboriculturist in the Last Year:   Transportation Needs:   . Film/video editor (Medical):   Marland Kitchen Lack of Transportation (Non-Medical):   Physical Activity:   . Days of Exercise per Week:   . Minutes of Exercise per Session:   Stress:   . Feeling of Stress :   Social Connections:   . Frequency of Communication with Friends and Family:   . Frequency of Social Gatherings with Friends and Family:   . Attends Religious Services:   . Active Member of Clubs or Organizations:   . Attends Archivist Meetings:   Marland Kitchen Marital Status:   Intimate Partner Violence:   . Fear of Current or Ex-Partner:   . Emotionally Abused:   Marland Kitchen Physically Abused:   . Sexually Abused:     Family History  Family history unknown: Yes     Current Outpatient Medications:  .  atorvastatin (LIPITOR) 20 MG tablet, Take 20 mg by mouth daily., Disp: , Rfl:  .  lidocaine-prilocaine (EMLA) cream, Apply 1 application topically as needed., Disp: 30 g, Rfl: 4  .  lisinopril (PRINIVIL,ZESTRIL) 20 MG tablet, Take 20 mg by mouth daily., Disp: , Rfl:  .  Multiple Vitamin (MULTIVITAMIN WITH MINERALS) TABS tablet, Take 1 tablet by mouth daily., Disp: , Rfl:  .  Omega-3 Fatty Acids (FISH OIL) 1000 MG CAPS, Take 1,000 mg by mouth daily. , Disp: , Rfl:  .  ondansetron (ZOFRAN) 8 MG tablet, Take 1 tablet (8 mg total) by mouth every 8 (eight) hours as needed for nausea or vomiting., Disp: 20 tablet, Rfl: 4 .  pantoprazole (PROTONIX) 40 MG tablet, Take 40 mg by mouth daily., Disp: , Rfl:  .  prochlorperazine (COMPAZINE) 10 MG tablet, Take 1 tablet (10 mg total) by mouth every 6 (six) hours as needed for nausea or vomiting., Disp: 30 tablet, Rfl: 4 .  Selenium (SELENIMIN PO), Take by mouth., Disp: , Rfl:  .  Turmeric 500 MG CAPS, Take 500 mg by mouth daily., Disp: , Rfl:   Physical exam:  Vitals:   05/28/19 1341  BP: 108/73  Pulse: 93  Resp: 18  Temp: 97.7 F (36.5 C)  TempSrc: Tympanic  SpO2: 98%   Physical Exam Constitutional:      Appearance: Normal appearance.  HENT:     Head: Normocephalic and atraumatic.  Eyes:     Pupils: Pupils are equal, round, and reactive to light.  Cardiovascular:     Rate and Rhythm: Normal rate and regular rhythm.     Heart sounds: Normal heart sounds. No murmur.  Pulmonary:     Effort: Pulmonary effort is normal.     Breath sounds: Normal breath sounds. No wheezing.  Abdominal:     General: Bowel sounds are normal. There is no distension.     Palpations: Abdomen is soft.     Tenderness: There is no abdominal tenderness.  Musculoskeletal:        General: Normal range of motion.     Cervical back: Normal range of motion.  Skin:    General: Skin is warm and dry.     Findings: No rash.  Neurological:     Mental Status: He is alert and oriented to person, place, and time.  Psychiatric:        Judgment: Judgment normal.      CMP Latest Ref Rng & Units 05/22/2019  Glucose 70 - 99 mg/dL 165(H)  BUN 6 - 20  mg/dL 18  Creatinine 0.61 - 1.24 mg/dL 0.90  Sodium 135 - 145 mmol/L 135  Potassium 3.5 - 5.1 mmol/L 4.7  Chloride 98 - 111 mmol/L 101  CO2 22 - 32 mmol/L 23  Calcium 8.9 - 10.3 mg/dL 9.0  Total Protein 6.5 - 8.1 g/dL 7.6  Total Bilirubin 0.3 - 1.2 mg/dL 1.4(H)  Alkaline Phos 38 - 126 U/L 174(H)  AST 15 - 41 U/L 85(H)  ALT 0 - 44 U/L 59(H)   CBC Latest Ref Rng & Units 05/22/2019  WBC 4.0 - 10.5 K/uL 7.1  Hemoglobin 13.0 - 17.0 g/dL 11.7(L)  Hematocrit 39.0 - 52.0 % 34.5(L)  Platelets 150 - 400 K/uL 119(L)    No images are attached to the encounter.  NM PET Image Restag (PS) Skull Base To Thigh  Result Date: 05/21/2019 CLINICAL DATA:  Subsequent treatment strategy for distal esophageal cancer. EXAM: NUCLEAR MEDICINE PET SKULL BASE TO THIGH TECHNIQUE: 15.9 mCi F-18 FDG was injected intravenously. Full-ring PET imaging was performed from the skull base to thigh after the radiotracer. CT data was obtained and used for attenuation correction and anatomic localization. Fasting blood glucose: 91 mg/dl COMPARISON:  CT abdomen/pelvis dated 03/01/2019. PET-CT dated 11/27/2018. FINDINGS: Mediastinal blood pool activity: SUV max 3.4 Liver activity: SUV max NA NECK: No hypermetabolic cervical lymphadenopathy. Focal hypermetabolism within the left pterygoid muscle, max SUV 5.6, although without CT abnormality. This appearance is indeterminate but of questionable clinical significance given the additional findings described below. Incidental CT findings: none CHEST: Patchy/nodular opacity in the posterior right lower lobe (series 3/image 136), max SUV 10.8. This may reflect mild infection/pneumonia, although tumor is not excluded given additional findings. Widespread thoracic nodal metastases, including: --14 mm short axis right supraclavicular node (series 3/image 68), max SUV 13.1 --14 mm short axis right paratracheal node (series 3/image 89), max SUV 14.3 --23 mm short axis subcarinal node (series 3/image  107), max SUV 19.0 --Approximate 24 mm short axis right hilar node (series 3/image 103), max SUV 15.9 --Approximate 17 mm short axis left hilar node (series 3/image 106), max SUV 10.7 No associated hypermetabolism involving the lower esophagus at the GE junction. No associated soft Jewel mass on CT. Incidental CT findings: Right chest  port terminates at the cavoatrial junction. Atherosclerotic calcifications of the aortic arch. 3 vessel coronary atherosclerosis. ABDOMEN/PELVIS: Cirrhosis. Although poorly evaluated on unenhanced CT, there is suspected multifocal hepatic metastases with a heterogeneous dominant mass in the posterior right hepatic lobe, max SUV 18.8. Pancreas is poorly evaluated but there is no definite pancreatic mass, atrophy, or ductal dilatation. No abnormal hypermetabolism in the spleen or adrenal glands. Widespread abdominopelvic nodal metastases, including: --22 mm short axis peripancreatic node in the porta hepatis (series 2/image 162), max SUV 26.5 --27 mm short axis portacaval node (series 3/image 170), max SUV 24.1 --15 mm short axis left para-aortic node (series 3/image 137), max SUV 20.4 --20 mm short axis right retrocaval node (series 3/image 190), max SUV 24.4 Incidental CT findings: Prior cholecystectomy. Mild atherosclerotic calcifications the abdominal aorta and branch vessels. Dystrophic calcifications involving the prostate. SKELETON: Widespread osseous metastases throughout the visualized axial and appendicular skeleton, including: --Right C1 pedicle, max SUV 7.3 --Sternum, max SUV 7.6 --Left humeral head, max SUV 14.6 --Left lateral 4th rib, max SUV 5.7 --Right T12 vertebral body, max SUV 21.9 ---Left L2 vertebral body, max SUV 17.3 --Left sacrum, max SUV 12.0 --Right iliac bone, max SUV 12.8 --Right anterior process the acetabulum, max SUV 13.2 --Left proximal femur, max SUV 5.9 No evidence of pathologic fracture. Incidental CT findings: Degenerative changes of the visualized  thoracolumbar spine. IMPRESSION: No evidence of residual lower esophageal mass or hypermetabolism. Widespread thoracic, abdominal, and retroperitoneal nodal metastases, as above. Widespread osseous metastases, as above. Suspected multifocal hepatic metastases, including a dominant mass in the right hepatic lobe, poorly visualized/evaluated. Underlying cirrhosis. Patchy right lower lobe opacity, favoring pneumonia, tumor not excluded. Technically speaking, while this is presumed to reflect recurrence of the patient's known esophageal cancer, that is not confirmed. Regardless, the patient's right supraclavicular nodes would be amenable to excision/sampling if needed. Additional ancillary findings as above. Electronically Signed   By: Julian Hy M.D.   On: 05/21/2019 13:45     Assessment and plan- Patient is a 59 y.o. male who presents for shortness of breath, bilateral calf pain and generalized weakness and malaise.  Unclear etiology but high suspicion for pulmonary embolus.  He is tachycardic in clinic and short of breath. Oxygen saturations are 95% at rest.  Unfortunately, he is not stable I recommend immediate evaluation in the emergency department.  Patient and wife in agreement.  Disposition: Patient taken directly to the emergency department for further evaluation and imaging.    Visit Diagnosis 1. Malignant neoplasm of lower third of esophagus [facility])     Patient expressed understanding and was in agreement with this plan. He also understands that He can call clinic at any time with any questions, concerns, or complaints.   Greater than 50% was spent in counseling and coordination of care with this patient including but not limited to discussion of the relevant topics above (See A&P) including, but not limited to diagnosis and management of acute and chronic medical conditions.   Thank you for allowing me to participate in the care of this very pleasant patient.    Jacquelin Hawking,  NP [place] [facility] Cell - [phone] Pager- [phone] 05/28/2019 3:55 PM   CC: Dr. Rogue Bussing

## 2019-05-28 NOTE — Telephone Encounter (Signed)
Spoke to patient via telephone. He stated that he has been more short of breath since his last visit with Dr. Rogue Bussing, and he went to the urgent care in [place] over the weekend and was given doxycycline for possible infection but he is feeling no better. He will come in today at 1:30 to see Rulon Abide, NP in the symptom management clinic.

## 2019-05-28 NOTE — ED Notes (Signed)
EDP at bedside at this time to assess patient.

## 2019-05-28 NOTE — Telephone Encounter (Signed)
Dr.B/s patient 

## 2019-05-28 NOTE — Telephone Encounter (Signed)
Wife called reporting that patient did go to Urge care and did CXR and they put him on Doxycycline and he is no better. He would like to come in to be seen in our office today for further evaluation. Please advise. Records of Urgent Care visit are in [facility]

## 2019-05-28 NOTE — ED Notes (Signed)
Pt taken to room ED 11, RN Jinny Blossom and EDP Isaacs at bedside. Pt assisted into bed.

## 2019-05-28 NOTE — ED Triage Notes (Signed)
Pt comes via POV from PCP with c/o generalized aches all over. Pt states SOB and it is worse when he gets up and walks.  Pt states PCP sent him over to rule out PE. Pt also states bilateral calf pain. Pt denies any recent injuries. Pt states he felt like he was going to pass out.  Pt states hx of esophageal cancer. Pt last treatment of chemo was 2 weeks ago.

## 2019-05-28 NOTE — ED Provider Notes (Signed)
[facility] Emergency Department Provider Note  ____________________________________________   First MD Initiated Contact with Patient 05/28/19 1639     (approximate)  I have reviewed the triage vital signs and the nursing notes.   HISTORY  Chief Complaint Shortness of Breath    HPI Matthew Yang is a 59 y.o. male  With h/o HTN, HLD, hep C, esophageal CA s/p radiation, with known liver and bone mets, here with SOB. Pt reports acute and progressively worsening SOB over past several days, significant worse today. He reports that he has had some associated poor appetite and general weakness over the weekend. Over the past 24 hours, his SOB has become severe and is now present at rest. He's had some associated mildly sharp pain in his diaphragm/lower chest area with deep inspiration. He has not had any fever, chills, sputum production, or hemoptysis. No known COVID exposures. No leg swelling. He went to see his oncologist for his sx today and was sent here immediately for evaluation of possible PE.       Past Medical History:  Diagnosis Date  . Diabetes mellitus without complication ([place])   . Esophagus cancer ([place])   . Hepatitis    HEPATITIS C  . Hypercholesteremia   . Hypertension   . Morbid obesity ([place])   . Positive hepatitis C antibody test   . Sleep apnea   . Tubular adenoma of colon    polyp    Patient Active Problem List   Diagnosis Date Noted  . CAP (community acquired pneumonia) 05/28/2019  . Malignant neoplasm of lower third of esophagus ([place]) 08/15/2018  . Tricompartment osteoarthritis of knee 12/26/2014  . Arthritis, senescent 12/26/2014  . Right knee pain 12/26/2014  . Knee derangement syndrome 12/26/2014  . Goals of care, counseling/discussion 08/30/2014  . Type 2 diabetes mellitus ([place]) 09/30/2013  . HCV (hepatitis C virus) 09/30/2013  . Benign hypertension 09/30/2013  . Morbid (severe) obesity due to excess calories ([place])  09/30/2013  . Obstructive apnea 09/30/2013    Past Surgical History:  Procedure Laterality Date  . CHOLECYSTECTOMY    . COLONOSCOPY    . COLONOSCOPY WITH PROPOFOL N/A 05/04/2016   Procedure: COLONOSCOPY WITH PROPOFOL;  Surgeon: Lollie Sails, MD;  Location: [facility] ENDOSCOPY;  Service: Endoscopy;  Laterality: N/A;  . ESOPHAGOGASTRODUODENOSCOPY N/A 05/03/2019   Procedure: ESOPHAGOGASTRODUODENOSCOPY (EGD);  Surgeon: Toledo, Benay Pike, MD;  Location: [facility] ENDOSCOPY;  Service: Gastroenterology;  Laterality: N/A;  . ESOPHAGOGASTRODUODENOSCOPY (EGD) WITH PROPOFOL N/A 08/04/2018   Procedure: ESOPHAGOGASTRODUODENOSCOPY (EGD) WITH PROPOFOL;  Surgeon: Lollie Sails, MD;  Location: [facility] ENDOSCOPY;  Service: Endoscopy;  Laterality: N/A;  . PORTA CATH INSERTION N/A 08/17/2018   Procedure: PORTA CATH INSERTION;  Surgeon: Algernon Huxley, MD;  Location: [place] CV LAB;  Service: Cardiovascular;  Laterality: N/A;    Prior to Admission medications   Medication Sig Start Date End Date Taking? Authorizing Provider  atorvastatin (LIPITOR) 20 MG tablet Take 20 mg by mouth at bedtime.    Yes [provider]  doxycycline (VIBRAMYCIN) 100 MG capsule Take 100 mg by mouth 2 (two) times daily.   Yes [provider]  lisinopril (PRINIVIL,ZESTRIL) 20 MG tablet Take 20 mg by mouth daily.   Yes [provider]  Multiple Vitamin (MULTIVITAMIN WITH MINERALS) TABS tablet Take 1 tablet by mouth daily.   Yes [provider]  Omega-3 Fatty Acids (FISH OIL) 1000 MG CAPS Take 1,000 mg by mouth daily.    Yes [provider]  pantoprazole (PROTONIX) 40 MG tablet Take 40 mg by mouth daily.   Yes [provider]  Turmeric 500 MG CAPS Take 500 mg by mouth daily.   Yes [provider]  lidocaine-prilocaine (EMLA) cream Apply 1 application topically as needed. 05/22/19   Cammie Sickle, MD  ondansetron (ZOFRAN) 8 MG tablet Take 1 tablet (8 mg total) by mouth  every 8 (eight) hours as needed for nausea or vomiting. Patient not taking: Reported on 05/28/2019 08/23/18   Cammie Sickle, MD  prochlorperazine (COMPAZINE) 10 MG tablet Take 1 tablet (10 mg total) by mouth every 6 (six) hours as needed for nausea or vomiting. Patient not taking: Reported on 05/28/2019 08/23/18   Cammie Sickle, MD    Allergies Patient has no known allergies.  Family History  Family history unknown: Yes    Social History Social History   Tobacco Use  . Smoking status: Never Smoker  . Smokeless tobacco: Never Used  Substance Use Topics  . Alcohol use: Not Currently  . Drug use: No    Review of Systems  Review of Systems  Constitutional: Positive for fatigue. Negative for chills and fever.  HENT: Negative for sore throat.   Respiratory: Positive for cough, chest tightness and shortness of breath.   Cardiovascular: Positive for chest pain.  Gastrointestinal: Negative for abdominal pain.  Genitourinary: Negative for flank pain.  Musculoskeletal: Negative for neck pain.  Skin: Negative for rash and wound.  Allergic/Immunologic: Negative for immunocompromised state.  Neurological: Negative for weakness and numbness.  Hematological: Does not bruise/bleed easily.  All other systems reviewed and are negative.    ____________________________________________  PHYSICAL EXAM:      VITAL SIGNS: ED Triage Vitals  Enc Vitals Group     BP 05/28/19 1615 (!) 76/57     Pulse Rate 05/28/19 1615 (!) 130     Resp 05/28/19 1615 (!) 22     Temp 05/28/19 1615 98.7 F (37.1 C)     Temp src --      SpO2 05/28/19 1615 92 %     Weight 05/28/19 1612 293 lb (132.9 kg)     Height 05/28/19 1612 5\' 10"  (1.778 m)     Head Circumference --      Peak Flow --      Pain Score 05/28/19 1612 10     Pain Loc --      Pain Edu? --      Excl. in [place]? --      Physical Exam Vitals and nursing note reviewed.  Constitutional:      General: He is not in acute distress.     Appearance: He is well-developed.  HENT:     Head: Normocephalic and atraumatic.  Eyes:     Conjunctiva/sclera: Conjunctivae normal.  Cardiovascular:     Rate and Rhythm: Regular rhythm. Tachycardia present.     Heart sounds: Normal heart sounds.  Pulmonary:     Effort: Pulmonary effort is normal. Tachypnea present. No respiratory distress.     Breath sounds: Examination of the right-lower field reveals rales. Examination of the left-lower field reveals rales. Decreased breath sounds and rales present. No wheezing.  Abdominal:     General: There is no distension.  Musculoskeletal:     Cervical back: Neck supple.  Skin:    General: Skin is warm.     Capillary Refill: Capillary refill takes less than 2 seconds.     Findings: No rash.  Neurological:  Mental Status: He is alert and oriented to person, place, and time.     Motor: No abnormal muscle tone.       ____________________________________________   LABS (all labs ordered are listed, but only abnormal results are displayed)  Labs Reviewed  CBC WITH DIFFERENTIAL/PLATELET - Abnormal; Notable for the following components:      Result Value   HCT 38.6 (*)    Platelets 143 (*)    Monocytes Absolute 1.2 (*)    Abs Immature Granulocytes 0.12 (*)    All other components within normal limits  COMPREHENSIVE METABOLIC PANEL - Abnormal; Notable for the following components:   Glucose, Bld 120 (*)    AST 201 (*)    ALT 108 (*)    Alkaline Phosphatase 300 (*)    Total Bilirubin 2.0 (*)    All other components within normal limits  LACTIC ACID, PLASMA - Abnormal; Notable for the following components:   Lactic Acid, Venous 3.1 (*)    All other components within normal limits  LACTIC ACID, PLASMA - Abnormal; Notable for the following components:   Lactic Acid, Venous 3.7 (*)    All other components within normal limits  TROPONIN I (HIGH SENSITIVITY) - Abnormal; Notable for the following components:   Troponin I (High  Sensitivity) 56 (*)    All other components within normal limits  TROPONIN I (HIGH SENSITIVITY) - Abnormal; Notable for the following components:   Troponin I (High Sensitivity) 47 (*)    All other components within normal limits  RESPIRATORY PANEL BY RT PCR (FLU A&B, COVID)  CULTURE, BLOOD (ROUTINE X 2)  CULTURE, BLOOD (ROUTINE X 2)  EXPECTORATED SPUTUM ASSESSMENT W REFEX TO RESP CULTURE  EXPECTORATED SPUTUM ASSESSMENT W REFEX TO RESP CULTURE  APTT  PROTIME-INR  BRAIN NATRIURETIC PEPTIDE  LIPASE, BLOOD  HIV ANTIBODY (ROUTINE TESTING W REFLEX)  CBC  COMPREHENSIVE METABOLIC PANEL  LEGIONELLA PNEUMOPHILA SEROGP 1 UR AG  STREP PNEUMONIAE URINARY ANTIGEN    ____________________________________________  EKG: Sinus tachycardia, VR 134. PR 116, QRS 80, QTc 450. No acute ST elevation or depressions. Non-specific likely rate related changes. ________________________________________  RADIOLOGY All imaging, including plain films, CT scans, and ultrasounds, independently reviewed by me, and interpretations confirmed via formal radiology reads.  ED MD interpretation:   CXR: Mild atelectasis or infiltrate R lung base  Official radiology report(s): CT Angio Chest PE W/Cm &/Or Wo Cm  Result Date: 05/28/2019 CLINICAL DATA:  59 year old male with shortness of breath and generalized pain. History of esophageal cancer. EXAM: CT ANGIOGRAPHY CHEST CT ABDOMEN AND PELVIS WITH CONTRAST TECHNIQUE: Multidetector CT imaging of the chest was performed using the standard protocol during bolus administration of intravenous contrast. Multiplanar CT image reconstructions and MIPs were obtained to evaluate the vascular anatomy. Multidetector CT imaging of the abdomen and pelvis was performed using the standard protocol during bolus administration of intravenous contrast. CONTRAST:  33mL OMNIPAQUE IOHEXOL 350 MG/ML SOLN COMPARISON:  CT of the chest abdomen pelvis dated 02/19/2019. PET CT dated 05/21/2019. FINDINGS:  CTA CHEST FINDINGS Cardiovascular: There is no cardiomegaly or pericardial effusion. Advanced 3 vessel coronary vascular calcification. There is mild atherosclerotic calcification of the thoracic aorta. No aneurysmal dilatation or dissection. Partially visualized Port-A-Cath with tip in the central SVC close to the cavoatrial junction. Evaluation of the pulmonary arteries is very limited due to respiratory motion artifact and suboptimal opacification and timing of the contrast. No large or central pulmonary artery embolus identified. Mediastinum/Nodes: Bilateral hilar and mediastinal adenopathy  new since the CT of 02/19/2019. Right hilar lymph nodes measure up to 2.3 cm in short axis. Subcarinal adenopathy measures 2.5 cm in short axis. The esophagus is grossly unremarkable but suboptimally visualized due to mediastinal adenopathy. No mediastinal fluid collection. Lungs/Pleura: Faint area of opacity at the right lung base similar to the PET-CT of 05/21/2019 most consistent with pneumonia. Clinical correlation and follow-up recommended. There is diffuse interstitial and interlobular septal thickening of the right lung with mild nodularity most likely infectious or inflammatory in etiology. There is probably mild associated or reactive interstitial edema in the right lung. The left lung is clear. There is no pleural effusion or pneumothorax. The central airways are patent. Musculoskeletal: Partially visualized right supraclavicular lymph node measures approximately 14 mm (series 4, image 1). Mildly rounded lymph node in the posterior neck deep to the right thyroid lobe (series 4, image 4) measures approximately 12 mm. No definite axillary adenopathy. Mild degenerative changes of the spine. No acute osseous pathology. Review of the MIP images confirms the above findings. CT ABDOMEN and PELVIS FINDINGS No intra-abdominal free air. Small ascites. Hepatobiliary: Cirrhosis. Innumerable hepatic hypodense lesions, new since  the CT of 02/19/2019 consistent with metastatic disease. The largest lesion or confluent of lesions in the right lobe of the liver measures approximately 6.7 x 7.7 cm. No intrahepatic biliary ductal dilatation. Cholecystectomy. Pancreas: The pancreas is unremarkable as visualized. Spleen: Normal in size without focal abnormality. Adrenals/Urinary Tract: The adrenal glands are unremarkable. There is no hydronephrosis on either side. There is symmetric enhancement and excretion of contrast by both kidneys. The visualized ureters and urinary bladder appear unremarkable. Stomach/Bowel: There is no bowel obstruction or active inflammation. The appendix is normal. Vascular/Lymphatic: Mild aortoiliac atherosclerotic disease. The IVC is unremarkable. No portal venous gas. Retroperitoneal and porta hepatic adenopathy, new since the prior CT of 02/19/2019. For example an enlarged periportal lymph node measures 2 cm in short axis. Retrocaval lymph node measures 2.1 cm in short axis. Reproductive: The prostate and seminal vesicles are grossly unremarkable. Other: None Musculoskeletal: Multilevel degenerative changes of the spine with disc desiccation and vacuum phenomena. No acute osseous pathology. Review of the MIP images confirms the above findings. IMPRESSION: 1. Findings most consistent with right lower lobe pneumonia and associated reactive mild interstitial edema. Clinical correlation and follow-up to resolution recommended. 2. Interval development of significant thoracic, and retroperitoneal adenopathy as well as extensive hepatic metastatic disease since the CT of 02/19/2019. 3. Cirrhosis with small ascites. 4. Aortic Atherosclerosis (ICD10-I70.0). Electronically Signed   By: Anner Crete M.D.   On: 05/28/2019 18:00   CT ABDOMEN PELVIS W CONTRAST  Result Date: 05/28/2019 CLINICAL DATA:  59 year old male with shortness of breath and generalized pain. History of esophageal cancer. EXAM: CT ANGIOGRAPHY CHEST CT  ABDOMEN AND PELVIS WITH CONTRAST TECHNIQUE: Multidetector CT imaging of the chest was performed using the standard protocol during bolus administration of intravenous contrast. Multiplanar CT image reconstructions and MIPs were obtained to evaluate the vascular anatomy. Multidetector CT imaging of the abdomen and pelvis was performed using the standard protocol during bolus administration of intravenous contrast. CONTRAST:  41mL OMNIPAQUE IOHEXOL 350 MG/ML SOLN COMPARISON:  CT of the chest abdomen pelvis dated 02/19/2019. PET CT dated 05/21/2019. FINDINGS: CTA CHEST FINDINGS Cardiovascular: There is no cardiomegaly or pericardial effusion. Advanced 3 vessel coronary vascular calcification. There is mild atherosclerotic calcification of the thoracic aorta. No aneurysmal dilatation or dissection. Partially visualized Port-A-Cath with tip in the central SVC close to the  cavoatrial junction. Evaluation of the pulmonary arteries is very limited due to respiratory motion artifact and suboptimal opacification and timing of the contrast. No large or central pulmonary artery embolus identified. Mediastinum/Nodes: Bilateral hilar and mediastinal adenopathy new since the CT of 02/19/2019. Right hilar lymph nodes measure up to 2.3 cm in short axis. Subcarinal adenopathy measures 2.5 cm in short axis. The esophagus is grossly unremarkable but suboptimally visualized due to mediastinal adenopathy. No mediastinal fluid collection. Lungs/Pleura: Faint area of opacity at the right lung base similar to the PET-CT of 05/21/2019 most consistent with pneumonia. Clinical correlation and follow-up recommended. There is diffuse interstitial and interlobular septal thickening of the right lung with mild nodularity most likely infectious or inflammatory in etiology. There is probably mild associated or reactive interstitial edema in the right lung. The left lung is clear. There is no pleural effusion or pneumothorax. The central airways are  patent. Musculoskeletal: Partially visualized right supraclavicular lymph node measures approximately 14 mm (series 4, image 1). Mildly rounded lymph node in the posterior neck deep to the right thyroid lobe (series 4, image 4) measures approximately 12 mm. No definite axillary adenopathy. Mild degenerative changes of the spine. No acute osseous pathology. Review of the MIP images confirms the above findings. CT ABDOMEN and PELVIS FINDINGS No intra-abdominal free air. Small ascites. Hepatobiliary: Cirrhosis. Innumerable hepatic hypodense lesions, new since the CT of 02/19/2019 consistent with metastatic disease. The largest lesion or confluent of lesions in the right lobe of the liver measures approximately 6.7 x 7.7 cm. No intrahepatic biliary ductal dilatation. Cholecystectomy. Pancreas: The pancreas is unremarkable as visualized. Spleen: Normal in size without focal abnormality. Adrenals/Urinary Tract: The adrenal glands are unremarkable. There is no hydronephrosis on either side. There is symmetric enhancement and excretion of contrast by both kidneys. The visualized ureters and urinary bladder appear unremarkable. Stomach/Bowel: There is no bowel obstruction or active inflammation. The appendix is normal. Vascular/Lymphatic: Mild aortoiliac atherosclerotic disease. The IVC is unremarkable. No portal venous gas. Retroperitoneal and porta hepatic adenopathy, new since the prior CT of 02/19/2019. For example an enlarged periportal lymph node measures 2 cm in short axis. Retrocaval lymph node measures 2.1 cm in short axis. Reproductive: The prostate and seminal vesicles are grossly unremarkable. Other: None Musculoskeletal: Multilevel degenerative changes of the spine with disc desiccation and vacuum phenomena. No acute osseous pathology. Review of the MIP images confirms the above findings. IMPRESSION: 1. Findings most consistent with right lower lobe pneumonia and associated reactive mild interstitial edema.  Clinical correlation and follow-up to resolution recommended. 2. Interval development of significant thoracic, and retroperitoneal adenopathy as well as extensive hepatic metastatic disease since the CT of 02/19/2019. 3. Cirrhosis with small ascites. 4. Aortic Atherosclerosis (ICD10-I70.0). Electronically Signed   By: Anner Crete M.D.   On: 05/28/2019 18:00   DG Chest Port 1 View  Result Date: 05/28/2019 CLINICAL DATA:  Generalized aches with shortness of breath. EXAM: PORTABLE CHEST 1 VIEW COMPARISON:  April 27, 2010 FINDINGS: A right-sided venous Port-A-Cath is seen with its distal tip overlying the distal aspect of the superior vena cava. Mild atelectasis and/or infiltrate is seen within the right lung base. There is no evidence of a pleural effusion or pneumothorax. The heart size and mediastinal contours are within normal limits. Degenerative changes seen throughout the thoracic spine. Prominent small bowel loops are suspected within the visualized portion of the upper abdomen. IMPRESSION: 1. Mild atelectasis and/or infiltrate within the right lung base. 2. Prominent small bowel loops  within the upper abdomen. Sequelae associated with a partial small bowel obstruction versus ileus cannot be excluded. Correlation with abdominal plain films is recommended. Electronically Signed   By: Virgina Norfolk M.D.   On: 05/28/2019 17:08    ____________________________________________  PROCEDURES   Procedure(s) performed (including Critical Care):  Procedures  ____________________________________________  INITIAL IMPRESSION / MDM / [place] / ED COURSE  As part of my medical decision making, I reviewed the following data within the [place] notes reviewed and incorporated, Old chart reviewed, Notes from prior ED visits, and [place] Controlled Substance Database       *Matthew Yang was evaluated in Emergency Department on 05/29/2019 for the symptoms described  in the history of present illness. He was evaluated in the context of the global COVID-19 pandemic, which necessitated consideration that the patient might be at risk for infection with the SARS-CoV-2 virus that causes COVID-19. Institutional protocols and algorithms that pertain to the evaluation of patients at risk for COVID-19 are in a state of rapid change based on information released by regulatory bodies including the CDC and federal and state organizations. These policies and algorithms were followed during the patient's care in the ED.  Some ED evaluations and interventions may be delayed as a result of limited staffing during the pandemic.*     Medical Decision Making:  59 yo M here with SOB, cough, fatigue. Initial consideration PE, PNA, malignant effusion. CT angio obtained and shows no PE but does show persistent and worsening R-sided PNA. Broad-spectrum ABX started. LA elevated, though he has no hypotension ro signs of hypoperfusion and I suspect this is more so 2/2 his underlying liver mets/liver disease, but will continue fluid resuscitation. No CP or sx to suggest ischemic etiology. Admit to medicine.  ____________________________________________  FINAL CLINICAL IMPRESSION(S) / ED DIAGNOSES  Final diagnoses:  Community acquired pneumonia of right lower lobe of lung     MEDICATIONS GIVEN DURING THIS VISIT:  Medications  lisinopril (ZESTRIL) tablet 20 mg (has no administration in time range)  pantoprazole (PROTONIX) EC tablet 40 mg (has no administration in time range)  multivitamin with minerals tablet 1 tablet (has no administration in time range)  omega-3 acid ethyl esters (LOVAZA) capsule 1 g (has no administration in time range)  enoxaparin (LOVENOX) injection 40 mg (40 mg Subcutaneous Given 05/28/19 2145)  0.9 %  sodium chloride infusion ( Intravenous New Bag/Given 05/28/19 2137)  acetaminophen (TYLENOL) tablet 650 mg (has no administration in time range)    Or    acetaminophen (TYLENOL) suppository 650 mg (has no administration in time range)  traZODone (DESYREL) tablet 25 mg (25 mg Oral Given 05/28/19 2140)  magnesium hydroxide (MILK OF MAGNESIA) suspension 30 mL (has no administration in time range)  ondansetron (ZOFRAN) tablet 4 mg ( Oral See Alternative 05/28/19 2144)    Or  ondansetron (ZOFRAN) injection 4 mg (4 mg Intravenous Given 05/28/19 2144)  cefTRIAXone (ROCEPHIN) 2 g in sodium chloride 0.9 % 100 mL IVPB (has no administration in time range)  azithromycin (ZITHROMAX) 500 mg in sodium chloride 0.9 % 250 mL IVPB (has no administration in time range)  guaiFENesin (MUCINEX) 12 hr tablet 600 mg (600 mg Oral Given 05/28/19 2140)  ipratropium-albuterol (DUONEB) 0.5-2.5 (3) MG/3ML nebulizer solution 3 mL (3 mLs Nebulization Given 05/28/19 2146)  chlorpheniramine-HYDROcodone (TUSSIONEX) 10-8 MG/5ML suspension 5 mL (has no administration in time range)  morphine 2 MG/ML injection 2 mg (2 mg Intravenous Given 05/28/19 2152)  sodium chloride 0.9 % bolus 1,000 mL (0 mLs Intravenous Stopped 05/28/19 1918)  iohexol (OMNIPAQUE) 350 MG/ML injection 75 mL (75 mLs Intravenous Contrast Given 05/28/19 1719)  sodium chloride 0.9 % bolus 1,000 mL (0 mLs Intravenous Stopped 05/28/19 2129)  cefTRIAXone (ROCEPHIN) 2 g in sodium chloride 0.9 % 100 mL IVPB (0 g Intravenous Stopped 05/28/19 2129)  azithromycin (ZITHROMAX) 500 mg in sodium chloride 0.9 % 250 mL IVPB (0 mg Intravenous Stopped 05/28/19 2017)  sodium chloride 0.9 % bolus 1,000 mL (0 mLs Intravenous Stopped 05/28/19 2129)     ED Discharge Orders    None       Note:  This document was prepared using Dragon voice recognition software and may include unintentional dictation errors.   Duffy Bruce, MD 05/29/19 [phone]

## 2019-05-29 DIAGNOSIS — C159 Malignant neoplasm of esophagus, unspecified: Secondary | ICD-10-CM

## 2019-05-29 DIAGNOSIS — J189 Pneumonia, unspecified organism: Secondary | ICD-10-CM

## 2019-05-29 DIAGNOSIS — G893 Neoplasm related pain (acute) (chronic): Secondary | ICD-10-CM

## 2019-05-29 DIAGNOSIS — C155 Malignant neoplasm of lower third of esophagus: Secondary | ICD-10-CM

## 2019-05-29 LAB — COMPREHENSIVE METABOLIC PANEL
ALT: 89 U/L — ABNORMAL HIGH (ref 0–44)
AST: 180 U/L — ABNORMAL HIGH (ref 15–41)
Albumin: 2.8 g/dL — ABNORMAL LOW (ref 3.5–5.0)
Alkaline Phosphatase: 245 U/L — ABNORMAL HIGH (ref 38–126)
Anion gap: 10 (ref 5–15)
BUN: 15 mg/dL (ref 6–20)
CO2: 21 mmol/L — ABNORMAL LOW (ref 22–32)
Calcium: 8.6 mg/dL — ABNORMAL LOW (ref 8.9–10.3)
Chloride: 106 mmol/L (ref 98–111)
Creatinine, Ser: 0.81 mg/dL (ref 0.61–1.24)
GFR calc Af Amer: >60 mL/min (ref >60)
GFR calc non Af Amer: >60 mL/min (ref >60)
Glucose, Bld: 106 mg/dL — ABNORMAL HIGH (ref 70–99)
Potassium: 4.5 mmol/L (ref 3.5–5.1)
Sodium: 137 mmol/L (ref 135–145)
Total Bilirubin: 1.8 mg/dL — ABNORMAL HIGH (ref 0.3–1.2)
Total Protein: 6.3 g/dL — ABNORMAL LOW (ref 6.5–8.1)

## 2019-05-29 LAB — CBC
HCT: 33 % — ABNORMAL LOW (ref 39.0–52.0)
Hemoglobin: 11.1 g/dL — ABNORMAL LOW (ref 13.0–17.0)
MCH: 31 pg (ref 26.0–34.0)
MCHC: 33.6 g/dL (ref 30.0–36.0)
MCV: 92.2 fL (ref 80.0–100.0)
Platelets: 114 10*3/uL — ABNORMAL LOW (ref 150–400)
RBC: 3.58 MIL/uL — ABNORMAL LOW (ref 4.22–5.81)
RDW: 13.6 % (ref 11.5–15.5)
WBC: 6.1 10*3/uL (ref 4.0–10.5)
nRBC: 0 % (ref 0.0–0.2)

## 2019-05-29 LAB — HIV ANTIBODY (ROUTINE TESTING W REFLEX): HIV Screen 4th Generation wRfx: NONREACTIVE

## 2019-05-29 MED ORDER — ALUM & MAG HYDROXIDE-SIMETH 200-200-25 MG PO CHEW
2.0000 | CHEWABLE_TABLET | Freq: Three times a day (TID) | ORAL | Status: DC | PRN
Start: 2019-05-29 — End: 2019-05-30
  Filled 2019-05-29 (×2): qty 2

## 2019-05-29 MED ORDER — SODIUM CHLORIDE 0.9 % IV SOLN
INTRAVENOUS | Status: DC | PRN
  Administered 2019-05-29: 22:00:00 500 mL via INTRAVENOUS

## 2019-05-29 NOTE — ED Notes (Signed)
Pt alert in bed watching TV.

## 2019-05-29 NOTE — ED Notes (Signed)
Put pt in hospital bed

## 2019-05-29 NOTE — ED Notes (Signed)
Family updated as to patient's status. Notified pt has room on the floor.

## 2019-05-29 NOTE — ED Notes (Signed)
Lab called to get 5 am labs

## 2019-05-29 NOTE — Progress Notes (Addendum)
[facility] NAME: Matthew Yang    MR#:  [id number]  DATE OF BIRTH:  [birth year]  SUBJECTIVE:  CHIEF COMPLAINT:   Chief Complaint  Patient presents with  . Shortness of Breath  SOB, tachy REVIEW OF SYSTEMS:  Review of Systems  Constitutional: Positive for malaise/fatigue. Negative for diaphoresis, fever and weight loss.  HENT: Negative for ear discharge, ear pain, hearing loss, nosebleeds, sore throat and tinnitus.   Eyes: Negative for blurred vision and pain.  Respiratory: Positive for shortness of breath. Negative for cough, hemoptysis and wheezing.   Cardiovascular: Negative for chest pain, palpitations, orthopnea and leg swelling.  Gastrointestinal: Negative for abdominal pain, blood in stool, constipation, diarrhea, heartburn, nausea and vomiting.  Genitourinary: Negative for dysuria, frequency and urgency.  Musculoskeletal: Negative for back pain and myalgias.  Skin: Negative for itching and rash.  Neurological: Negative for dizziness, tingling, tremors, focal weakness, seizures, weakness and headaches.  Psychiatric/Behavioral: Negative for depression. The patient is not nervous/anxious.    DRUG ALLERGIES:  No Known Allergies VITALS:  Blood pressure 128/68, pulse (!) 104, temperature 98.1 F (36.7 C), temperature source Oral, resp. rate 20, height 5\' 10"  (1.778 m), weight 132.9 kg, SpO2 94 %. PHYSICAL EXAMINATION:  Physical Exam HENT:     Head: Normocephalic and atraumatic.  Eyes:     Conjunctiva/sclera: Conjunctivae normal.     Pupils: Pupils are equal, round, and reactive to light.  Neck:     Thyroid: No thyromegaly.     Trachea: No tracheal deviation.  Cardiovascular:     Rate and Rhythm: Normal rate and regular rhythm.     Heart sounds: Normal heart sounds.  Pulmonary:     Effort: Pulmonary effort is normal. No respiratory distress.     Breath sounds: Examination of the right-lower field reveals decreased breath sounds  and rhonchi. Decreased breath sounds and rhonchi present. No wheezing.  Chest:     Chest wall: No tenderness.  Abdominal:     General: Bowel sounds are normal. There is no distension.     Palpations: Abdomen is soft.     Tenderness: There is no abdominal tenderness.  Musculoskeletal:        General: Normal range of motion.     Cervical back: Normal range of motion and neck supple.  Skin:    General: Skin is warm and dry.     Findings: No rash.  Neurological:     Mental Status: He is alert and oriented to person, place, and time.     Cranial Nerves: No cranial nerve deficit.    LABORATORY PANEL:  Male CBC Recent Labs  Lab 05/29/19 0758  WBC 6.1  HGB 11.1*  HCT 33.0*  PLT 114*   ------------------------------------------------------------------------------------------------------------------ Chemistries  Recent Labs  Lab 05/29/19 0758  NA 137  K 4.5  CL 106  CO2 21*  GLUCOSE 106*  BUN 15  CREATININE 0.81  CALCIUM 8.6*  AST 180*  ALT 89*  ALKPHOS 245*  BILITOT 1.8*   RADIOLOGY:  CT Angio Chest PE W/Cm &/Or Wo Cm  Result Date: 05/28/2019 CLINICAL DATA:  59 year old male with shortness of breath and generalized pain. History of esophageal cancer. EXAM: CT ANGIOGRAPHY CHEST CT ABDOMEN AND PELVIS WITH CONTRAST TECHNIQUE: Multidetector CT imaging of the chest was performed using the standard protocol during bolus administration of intravenous contrast. Multiplanar CT image reconstructions and MIPs were obtained to evaluate the vascular anatomy. Multidetector  CT imaging of the abdomen and pelvis was performed using the standard protocol during bolus administration of intravenous contrast. CONTRAST:  31mL OMNIPAQUE IOHEXOL 350 MG/ML SOLN COMPARISON:  CT of the chest abdomen pelvis dated 02/19/2019. PET CT dated 05/21/2019. FINDINGS: CTA CHEST FINDINGS Cardiovascular: There is no cardiomegaly or pericardial effusion. Advanced 3 vessel coronary vascular calcification. There is  mild atherosclerotic calcification of the thoracic aorta. No aneurysmal dilatation or dissection. Partially visualized Port-A-Cath with tip in the central SVC close to the cavoatrial junction. Evaluation of the pulmonary arteries is very limited due to respiratory motion artifact and suboptimal opacification and timing of the contrast. No large or central pulmonary artery embolus identified. Mediastinum/Nodes: Bilateral hilar and mediastinal adenopathy new since the CT of 02/19/2019. Right hilar lymph nodes measure up to 2.3 cm in short axis. Subcarinal adenopathy measures 2.5 cm in short axis. The esophagus is grossly unremarkable but suboptimally visualized due to mediastinal adenopathy. No mediastinal fluid collection. Lungs/Pleura: Faint area of opacity at the right lung base similar to the PET-CT of 05/21/2019 most consistent with pneumonia. Clinical correlation and follow-up recommended. There is diffuse interstitial and interlobular septal thickening of the right lung with mild nodularity most likely infectious or inflammatory in etiology. There is probably mild associated or reactive interstitial edema in the right lung. The left lung is clear. There is no pleural effusion or pneumothorax. The central airways are patent. Musculoskeletal: Partially visualized right supraclavicular lymph node measures approximately 14 mm (series 4, image 1). Mildly rounded lymph node in the posterior neck deep to the right thyroid lobe (series 4, image 4) measures approximately 12 mm. No definite axillary adenopathy. Mild degenerative changes of the spine. No acute osseous pathology. Review of the MIP images confirms the above findings. CT ABDOMEN and PELVIS FINDINGS No intra-abdominal free air. Small ascites. Hepatobiliary: Cirrhosis. Innumerable hepatic hypodense lesions, new since the CT of 02/19/2019 consistent with metastatic disease. The largest lesion or confluent of lesions in the right lobe of the liver measures  approximately 6.7 x 7.7 cm. No intrahepatic biliary ductal dilatation. Cholecystectomy. Pancreas: The pancreas is unremarkable as visualized. Spleen: Normal in size without focal abnormality. Adrenals/Urinary Tract: The adrenal glands are unremarkable. There is no hydronephrosis on either side. There is symmetric enhancement and excretion of contrast by both kidneys. The visualized ureters and urinary bladder appear unremarkable. Stomach/Bowel: There is no bowel obstruction or active inflammation. The appendix is normal. Vascular/Lymphatic: Mild aortoiliac atherosclerotic disease. The IVC is unremarkable. No portal venous gas. Retroperitoneal and porta hepatic adenopathy, new since the prior CT of 02/19/2019. For example an enlarged periportal lymph node measures 2 cm in short axis. Retrocaval lymph node measures 2.1 cm in short axis. Reproductive: The prostate and seminal vesicles are grossly unremarkable. Other: None Musculoskeletal: Multilevel degenerative changes of the spine with disc desiccation and vacuum phenomena. No acute osseous pathology. Review of the MIP images confirms the above findings. IMPRESSION: 1. Findings most consistent with right lower lobe pneumonia and associated reactive mild interstitial edema. Clinical correlation and follow-up to resolution recommended. 2. Interval development of significant thoracic, and retroperitoneal adenopathy as well as extensive hepatic metastatic disease since the CT of 02/19/2019. 3. Cirrhosis with small ascites. 4. Aortic Atherosclerosis (ICD10-I70.0). Electronically Signed   By: Anner Crete M.D.   On: 05/28/2019 18:00   CT ABDOMEN PELVIS W CONTRAST  Result Date: 05/28/2019 CLINICAL DATA:  59 year old male with shortness of breath and generalized pain. History of esophageal cancer. EXAM: CT ANGIOGRAPHY CHEST CT ABDOMEN  AND PELVIS WITH CONTRAST TECHNIQUE: Multidetector CT imaging of the chest was performed using the standard protocol during bolus  administration of intravenous contrast. Multiplanar CT image reconstructions and MIPs were obtained to evaluate the vascular anatomy. Multidetector CT imaging of the abdomen and pelvis was performed using the standard protocol during bolus administration of intravenous contrast. CONTRAST:  48mL OMNIPAQUE IOHEXOL 350 MG/ML SOLN COMPARISON:  CT of the chest abdomen pelvis dated 02/19/2019. PET CT dated 05/21/2019. FINDINGS: CTA CHEST FINDINGS Cardiovascular: There is no cardiomegaly or pericardial effusion. Advanced 3 vessel coronary vascular calcification. There is mild atherosclerotic calcification of the thoracic aorta. No aneurysmal dilatation or dissection. Partially visualized Port-A-Cath with tip in the central SVC close to the cavoatrial junction. Evaluation of the pulmonary arteries is very limited due to respiratory motion artifact and suboptimal opacification and timing of the contrast. No large or central pulmonary artery embolus identified. Mediastinum/Nodes: Bilateral hilar and mediastinal adenopathy new since the CT of 02/19/2019. Right hilar lymph nodes measure up to 2.3 cm in short axis. Subcarinal adenopathy measures 2.5 cm in short axis. The esophagus is grossly unremarkable but suboptimally visualized due to mediastinal adenopathy. No mediastinal fluid collection. Lungs/Pleura: Faint area of opacity at the right lung base similar to the PET-CT of 05/21/2019 most consistent with pneumonia. Clinical correlation and follow-up recommended. There is diffuse interstitial and interlobular septal thickening of the right lung with mild nodularity most likely infectious or inflammatory in etiology. There is probably mild associated or reactive interstitial edema in the right lung. The left lung is clear. There is no pleural effusion or pneumothorax. The central airways are patent. Musculoskeletal: Partially visualized right supraclavicular lymph node measures approximately 14 mm (series 4, image 1). Mildly  rounded lymph node in the posterior neck deep to the right thyroid lobe (series 4, image 4) measures approximately 12 mm. No definite axillary adenopathy. Mild degenerative changes of the spine. No acute osseous pathology. Review of the MIP images confirms the above findings. CT ABDOMEN and PELVIS FINDINGS No intra-abdominal free air. Small ascites. Hepatobiliary: Cirrhosis. Innumerable hepatic hypodense lesions, new since the CT of 02/19/2019 consistent with metastatic disease. The largest lesion or confluent of lesions in the right lobe of the liver measures approximately 6.7 x 7.7 cm. No intrahepatic biliary ductal dilatation. Cholecystectomy. Pancreas: The pancreas is unremarkable as visualized. Spleen: Normal in size without focal abnormality. Adrenals/Urinary Tract: The adrenal glands are unremarkable. There is no hydronephrosis on either side. There is symmetric enhancement and excretion of contrast by both kidneys. The visualized ureters and urinary bladder appear unremarkable. Stomach/Bowel: There is no bowel obstruction or active inflammation. The appendix is normal. Vascular/Lymphatic: Mild aortoiliac atherosclerotic disease. The IVC is unremarkable. No portal venous gas. Retroperitoneal and porta hepatic adenopathy, new since the prior CT of 02/19/2019. For example an enlarged periportal lymph node measures 2 cm in short axis. Retrocaval lymph node measures 2.1 cm in short axis. Reproductive: The prostate and seminal vesicles are grossly unremarkable. Other: None Musculoskeletal: Multilevel degenerative changes of the spine with disc desiccation and vacuum phenomena. No acute osseous pathology. Review of the MIP images confirms the above findings. IMPRESSION: 1. Findings most consistent with right lower lobe pneumonia and associated reactive mild interstitial edema. Clinical correlation and follow-up to resolution recommended. 2. Interval development of significant thoracic, and retroperitoneal  adenopathy as well as extensive hepatic metastatic disease since the CT of 02/19/2019. 3. Cirrhosis with small ascites. 4. Aortic Atherosclerosis (ICD10-I70.0). Electronically Signed   By: Laren Everts.D.  On: 05/28/2019 18:00   DG Chest Port 1 View  Result Date: 05/28/2019 CLINICAL DATA:  Generalized aches with shortness of breath. EXAM: PORTABLE CHEST 1 VIEW COMPARISON:  April 27, 2010 FINDINGS: A right-sided venous Port-A-Cath is seen with its distal tip overlying the distal aspect of the superior vena cava. Mild atelectasis and/or infiltrate is seen within the right lung base. There is no evidence of a pleural effusion or pneumothorax. The heart size and mediastinal contours are within normal limits. Degenerative changes seen throughout the thoracic spine. Prominent small bowel loops are suspected within the visualized portion of the upper abdomen. IMPRESSION: 1. Mild atelectasis and/or infiltrate within the right lung base. 2. Prominent small bowel loops within the upper abdomen. Sequelae associated with a partial small bowel obstruction versus ileus cannot be excluded. Correlation with abdominal plain films is recommended. Electronically Signed   By: Virgina Norfolk M.D.   On: 05/28/2019 17:08   ASSESSMENT AND PLAN:  Matthew Yang 59 y.o.  male with a history of metastatic esophageal cancer admitted for pneumonia  1.  Right lower lobe community-acquired pneumonia differential diagnosis including streptococcal or atypicals. -Continue IV Rocephin and Zithromax  -We will follow blood and sputum culture as well as pneumonia antigens. -O2 protocol will be followed. -Mucolytics, as needed and scheduled duo nebs will be provided.  2.  Sepsis, likely secondary to #1.  This manifested by hypotension, tachycardia and tachypnea, present on admission  -Trend lactic acid level -await blood cultures - continue hydration with IV normal saline.  3.  Mild acute hypoxic respiratory failure  secondary to #1. -2 L oxygen via nasal cannula.  Wean as tolerated  4.  Dyslipidemia. - hold off statin therapy given elevated LFTs.  5.  Hypertension. -Continue Zestril.  6.  GERD. -continue PPI therapy.  7.  Metastatic esophageal cancer. -Followed by Dr. Rogue Bussing at cancer center -consult him.   DVT prophylaxis: Lovenox Family Communication: Updated wife at bedside Disposition Plan: Came from home and will be likely going back to home environment once improved Barriers to DC -none, waiting for clinical improvement  All the records are reviewed and case discussed with Care Management/Social Worker. Management plans discussed with the patient, family and they are in agreement.  CODE STATUS: Full Code  TOTAL TIME TAKING CARE OF THIS PATIENT: 35 minutes.   More than 50% of the time was spent in counseling/coordination of care: YES  POSSIBLE D/C IN 1-2 DAYS, DEPENDING ON CLINICAL CONDITION.   Max Sane M.D on 05/29/2019 at 4:03 PM  [facility]   CC: Primary care physician; Kirk Ruths, MD  Note: This dictation was prepared with Dragon dictation along with smaller phrase technology. Any transcriptional errors that result from this process are unintentional.

## 2019-05-29 NOTE — Assessment & Plan Note (Addendum)
59 year old male patient with a history of metastatic adenocarcinoma [poorly differentiated/question signet ring morphology] lower third of esophagus/GE junction-is currently admitted to hospital for pneumonia.  #Acute respiratory failure/underlying pneumonia versus others-not improving.  Currently on oral antibiotics.  S/p Lasix.  Chest x-ray no significant difference since admission.  Awaiting pulmonary evaluation.  #Stage IV adenocarcinoma the esophagus-progression noted on recent scans; Unfortunately elevated liver numbers-bilirubin up to 4/likely secondary to underlying malignancy.  This continues to be concerning.  Monitor for now  #Abdominal pain/right flank-/secondary to hepatomegaly/malignancy-recommend morphine 2 mg every 6 hours as needed.  Stable  #Anxiety-worse; recommend Ativan 1 mg every 8 hours as needed.  Reminded the patient to ask for Ativan as needed.  #Overall prognosis/goals of care: Patient met with palliative care yesterday.  He continues to be full code.  Understands that at least for foreseeable future he can go back to work-given his clinical condition.  Discussed with the patient's wife.  Discussed with Dr. Posey Pronto.  Dr. Janese Banks on call over the weekend.

## 2019-05-29 NOTE — Consult Note (Signed)
[facility] NOTE  Patient Care Team: Kirk Ruths, MD as PCP - General (Internal Medicine) Lucky Cowboy, Erskine Squibb, MD as Consulting Physician (Vascular Surgery) Clent Jacks, RN as Registered Nurse Alfonzo Feller, RN as [place] Management  CHIEF COMPLAINTS/PURPOSE OF CONSULTATION:   HISTORY OF PRESENTING ILLNESS:  Matthew Yang 59 y.o.  male with a history of metastatic esophageal cancer is continued to hospital for pneumonia.  Patient most recently on 5-FU single agent infusional chemotherapy for metastatic esophagus cancer-noted to have progressive disease on recent PET scan done last week.  Patient was supposed to start chemotherapy with Taxol Cyramza this week.   Patient complains of worsening shortness of breath over the last 4 to 5 days.  Progressive getting worse worse with exertion.  Denies any fevers.  Denies any worsening cough.  Complains of abdominal discomfort.  Also complains of nausea.  Poor appetite.  Extreme fatigue.  No headaches or mental status changes.  CT scans emergency room-shows right lower lobe infiltrate concerning for pneumonia.  Also shows progressive disease in the liver and also thoracic mediastinal adenopathy consistent with progressive disease as noted on the recent PET scan.  Also of note patient lactic acid was slightly elevated; and also LFTs 3-4 times are elevated with bilirubin 2.0.  Patient is currently on IV antibiotics-ceftriaxone azithromycin.  Also IV fluids.   Review of Systems  Constitutional: Positive for malaise/fatigue and weight loss. Negative for chills, diaphoresis and fever.  HENT: Negative for nosebleeds and sore throat.   Eyes: Negative for double vision.  Respiratory: Positive for cough and shortness of breath. Negative for hemoptysis, sputum production and wheezing.   Cardiovascular: Negative for chest pain, palpitations, orthopnea and leg swelling.  Gastrointestinal: Positive for  abdominal pain and nausea. Negative for blood in stool, constipation, diarrhea, heartburn, melena and vomiting.  Genitourinary: Negative for dysuria, frequency and urgency.  Musculoskeletal: Negative for back pain and joint pain.  Skin: Negative.  Negative for itching and rash.  Neurological: Positive for tingling. Negative for dizziness, focal weakness, weakness and headaches.  Endo/Heme/Allergies: Does not bruise/bleed easily.  Psychiatric/Behavioral: Negative for depression. The patient is not nervous/anxious and does not have insomnia.      MEDICAL HISTORY:  Past Medical History:  Diagnosis Date  . Diabetes mellitus without complication ([place])   . Esophagus cancer ([place])   . Hepatitis    HEPATITIS C  . Hypercholesteremia   . Hypertension   . Morbid obesity ([place])   . Positive hepatitis C antibody test   . Sleep apnea   . Tubular adenoma of colon    polyp    SURGICAL HISTORY: Past Surgical History:  Procedure Laterality Date  . CHOLECYSTECTOMY    . COLONOSCOPY    . COLONOSCOPY WITH PROPOFOL N/A 05/04/2016   Procedure: COLONOSCOPY WITH PROPOFOL;  Surgeon: Lollie Sails, MD;  Location: [facility] ENDOSCOPY;  Service: Endoscopy;  Laterality: N/A;  . ESOPHAGOGASTRODUODENOSCOPY N/A 05/03/2019   Procedure: ESOPHAGOGASTRODUODENOSCOPY (EGD);  Surgeon: Toledo, Benay Pike, MD;  Location: [facility] ENDOSCOPY;  Service: Gastroenterology;  Laterality: N/A;  . ESOPHAGOGASTRODUODENOSCOPY (EGD) WITH PROPOFOL N/A 08/04/2018   Procedure: ESOPHAGOGASTRODUODENOSCOPY (EGD) WITH PROPOFOL;  Surgeon: Lollie Sails, MD;  Location: [facility] ENDOSCOPY;  Service: Endoscopy;  Laterality: N/A;  . PORTA CATH INSERTION N/A 08/17/2018   Procedure: PORTA CATH INSERTION;  Surgeon: Algernon Huxley, MD;  Location: [place] CV LAB;  Service: Cardiovascular;  Laterality: N/A;    SOCIAL HISTORY: Social History  Socioeconomic History  . Marital status: Married    Spouse name: Not on file  . Number of children: Not on  file  . Years of education: Not on file  . Highest education level: Not on file  Occupational History  . Not on file  Tobacco Use  . Smoking status: Never Smoker  . Smokeless tobacco: Never Used  Substance and Sexual Activity  . Alcohol use: Not Currently  . Drug use: No  . Sexual activity: Not on file  Other Topics Concern  . 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    Social Determinants of Health   Financial Resource Strain:   . Difficulty of Paying Living Expenses:   Food Insecurity:   . Worried About Charity fundraiser in the Last Year:   . Arboriculturist in the Last Year:   Transportation Needs:   . Film/video editor (Medical):   Marland Kitchen Lack of Transportation (Non-Medical):   Physical Activity:   . Days of Exercise per Week:   . Minutes of Exercise per Session:   Stress:   . Feeling of Stress :   Social Connections:   . Frequency of Communication with Friends and Family:   . Frequency of Social Gatherings with Friends and Family:   . Attends Religious Services:   . Active Member of Clubs or Organizations:   . Attends Archivist Meetings:   Marland Kitchen Marital Status:   Intimate Partner Violence:   . Fear of Current or Ex-Partner:   . Emotionally Abused:   Marland Kitchen Physically Abused:   . Sexually Abused:     FAMILY HISTORY: Family History  Family history unknown: Yes    ALLERGIES:  has No Known Allergies.  MEDICATIONS:  Current Facility-Administered Medications  Medication Dose Route Frequency Provider Last Rate Last Admin  . 0.9 %  sodium chloride infusion   Intravenous Continuous Mansy, Jan A, MD 100 mL/hr at 05/29/19 1235 New Bag at 05/29/19 1235  . acetaminophen (TYLENOL) tablet 650 mg  650 mg Oral Q6H PRN Mansy, Jan A, MD       Or  . acetaminophen (TYLENOL) suppository 650 mg  650 mg Rectal Q6H PRN Mansy, Jan A, MD      . azithromycin (ZITHROMAX) 500 mg in sodium chloride 0.9 %  250 mL IVPB  500 mg Intravenous Q24H Mansy, Jan A, MD      . cefTRIAXone (ROCEPHIN) 2 g in sodium chloride 0.9 % 100 mL IVPB  2 g Intravenous Q24H Mansy, Jan A, MD      . chlorpheniramine-HYDROcodone (TUSSIONEX) 10-8 MG/5ML suspension 5 mL  5 mL Oral Q12H PRN Mansy, Jan A, MD      . enoxaparin (LOVENOX) injection 40 mg  40 mg Subcutaneous BID Mansy, Jan A, MD   40 mg at 05/29/19 0820  . guaiFENesin (MUCINEX) 12 hr tablet 600 mg  600 mg Oral BID Mansy, Jan A, MD   600 mg at 05/29/19 0819  . ipratropium-albuterol (DUONEB) 0.5-2.5 (3) MG/3ML nebulizer solution 3 mL  3 mL Nebulization QID Mansy, Jan A, MD   3 mL at 05/29/19 1235  . lisinopril (ZESTRIL) tablet 20 mg  20 mg Oral Daily Mansy, Jan A, MD   20 mg at 05/29/19 0819  . magnesium hydroxide (MILK OF MAGNESIA) suspension 30 mL  30 mL Oral Daily PRN Mansy, Arvella Merles, MD      .  morphine 2 MG/ML injection 2 mg  2 mg Intravenous Q4H PRN Mansy, Jan A, MD   2 mg at 05/28/19 2152  . multivitamin with minerals tablet 1 tablet  1 tablet Oral Daily Mansy, Jan A, MD   1 tablet at 05/29/19 0819  . omega-3 acid ethyl esters (LOVAZA) capsule 1 g  1 g Oral Daily Mansy, Jan A, MD   1 g at 05/29/19 0819  . ondansetron (ZOFRAN) tablet 4 mg  4 mg Oral Q6H PRN Mansy, Jan A, MD       Or  . ondansetron [facility]) injection 4 mg  4 mg Intravenous Q6H PRN Mansy, Jan A, MD   4 mg at 05/28/19 2144  . pantoprazole (PROTONIX) EC tablet 40 mg  40 mg Oral Daily Mansy, Jan A, MD   40 mg at 05/29/19 0819  . traZODone (DESYREL) tablet 25 mg  25 mg Oral QHS PRN Mansy, Jan A, MD   25 mg at 05/28/19 2140   Current Outpatient Medications  Medication Sig Dispense Refill  . atorvastatin (LIPITOR) 20 MG tablet Take 20 mg by mouth at bedtime.     Marland Kitchen doxycycline (VIBRAMYCIN) 100 MG capsule Take 100 mg by mouth 2 (two) times daily.    Marland Kitchen lisinopril (PRINIVIL,ZESTRIL) 20 MG tablet Take 20 mg by mouth daily.    . Multiple Vitamin (MULTIVITAMIN WITH MINERALS) TABS tablet Take 1 tablet by mouth  daily.    . Omega-3 Fatty Acids (FISH OIL) 1000 MG CAPS Take 1,000 mg by mouth daily.     . pantoprazole (PROTONIX) 40 MG tablet Take 40 mg by mouth daily.    . Turmeric 500 MG CAPS Take 500 mg by mouth daily.    Marland Kitchen lidocaine-prilocaine (EMLA) cream Apply 1 application topically as needed. 30 g 4  . ondansetron (ZOFRAN) 8 MG tablet Take 1 tablet (8 mg total) by mouth every 8 (eight) hours as needed for nausea or vomiting. (Patient not taking: Reported on 05/28/2019) 20 tablet 4  . prochlorperazine (COMPAZINE) 10 MG tablet Take 1 tablet (10 mg total) by mouth every 6 (six) hours as needed for nausea or vomiting. (Patient not taking: Reported on 05/28/2019) 30 tablet 4    PHYSICAL EXAMINATION:  Vitals:   05/29/19 1149 05/29/19 1217  BP:  111/75  Pulse:  (!) 103  Resp:  (!) 28  Temp:    SpO2: 95% 94%   Filed Weights   05/28/19 1612  Weight: 293 lb (132.9 kg)    Physical Exam  Constitutional: He is oriented to person, place, and time and well-developed, well-nourished, and in no distress.  Obese male patient.  On 2 L nasal cannula.  HENT:  Head: Normocephalic and atraumatic.  Mouth/Throat: Oropharynx is clear and moist. No oropharyngeal exudate.  Eyes: Pupils are equal, round, and reactive to light.  Cardiovascular: Normal rate and regular rhythm.  Pulmonary/Chest: No respiratory distress. He has no wheezes.  Decreased breath sounds right lower base.  Abdominal: Soft. Bowel sounds are normal. He exhibits no distension and no mass. There is abdominal tenderness. There is no rebound and no guarding.  Positive for hepatomegaly.  Musculoskeletal:        General: No tenderness or edema. Normal range of motion.     Cervical back: Normal range of motion and neck supple.  Neurological: He is alert and oriented to person, place, and time.  Skin: Skin is warm.  Psychiatric: Affect normal.     LABORATORY DATA:  I have reviewed the  data as listed Lab Results  Component Value Date   WBC  6.1 05/29/2019   HGB 11.1 (L) 05/29/2019   HCT 33.0 (L) 05/29/2019   MCV 92.2 05/29/2019   PLT 114 (L) 05/29/2019   Recent Labs    05/22/19 1246 05/28/19 1621 05/29/19 0758  NA 135 137 137  K 4.7 4.8 4.5  CL 101 102 106  CO2 23 22 21*  GLUCOSE 165* 120* 106*  BUN 18 19 15   CREATININE 0.90 1.06 0.81  CALCIUM 9.0 9.4 8.6*  GFRNONAA >60 >60 >60  GFRAA >60 >60 >60  PROT 7.6 7.6 6.3*  ALBUMIN 3.6 3.5 2.8*  AST 85* 201* 180*  ALT 59* 108* 89*  ALKPHOS 174* 300* 245*  BILITOT 1.4* 2.0* 1.8*    RADIOGRAPHIC STUDIES: I have personally reviewed the radiological images as listed and agreed with the findings in the report. CT Angio Chest PE W/Cm &/Or Wo Cm  Result Date: 05/28/2019 CLINICAL DATA:  59 year old male with shortness of breath and generalized pain. History of esophageal cancer. EXAM: CT ANGIOGRAPHY CHEST CT ABDOMEN AND PELVIS WITH CONTRAST TECHNIQUE: Multidetector CT imaging of the chest was performed using the standard protocol during bolus administration of intravenous contrast. Multiplanar CT image reconstructions and MIPs were obtained to evaluate the vascular anatomy. Multidetector CT imaging of the abdomen and pelvis was performed using the standard protocol during bolus administration of intravenous contrast. CONTRAST:  45mL OMNIPAQUE IOHEXOL 350 MG/ML SOLN COMPARISON:  CT of the chest abdomen pelvis dated 02/19/2019. PET CT dated 05/21/2019. FINDINGS: CTA CHEST FINDINGS Cardiovascular: There is no cardiomegaly or pericardial effusion. Advanced 3 vessel coronary vascular calcification. There is mild atherosclerotic calcification of the thoracic aorta. No aneurysmal dilatation or dissection. Partially visualized Port-A-Cath with tip in the central SVC close to the cavoatrial junction. Evaluation of the pulmonary arteries is very limited due to respiratory motion artifact and suboptimal opacification and timing of the contrast. No large or central pulmonary artery embolus  identified. Mediastinum/Nodes: Bilateral hilar and mediastinal adenopathy new since the CT of 02/19/2019. Right hilar lymph nodes measure up to 2.3 cm in short axis. Subcarinal adenopathy measures 2.5 cm in short axis. The esophagus is grossly unremarkable but suboptimally visualized due to mediastinal adenopathy. No mediastinal fluid collection. Lungs/Pleura: Faint area of opacity at the right lung base similar to the PET-CT of 05/21/2019 most consistent with pneumonia. Clinical correlation and follow-up recommended. There is diffuse interstitial and interlobular septal thickening of the right lung with mild nodularity most likely infectious or inflammatory in etiology. There is probably mild associated or reactive interstitial edema in the right lung. The left lung is clear. There is no pleural effusion or pneumothorax. The central airways are patent. Musculoskeletal: Partially visualized right supraclavicular lymph node measures approximately 14 mm (series 4, image 1). Mildly rounded lymph node in the posterior neck deep to the right thyroid lobe (series 4, image 4) measures approximately 12 mm. No definite axillary adenopathy. Mild degenerative changes of the spine. No acute osseous pathology. Review of the MIP images confirms the above findings. CT ABDOMEN and PELVIS FINDINGS No intra-abdominal free air. Small ascites. Hepatobiliary: Cirrhosis. Innumerable hepatic hypodense lesions, new since the CT of 02/19/2019 consistent with metastatic disease. The largest lesion or confluent of lesions in the right lobe of the liver measures approximately 6.7 x 7.7 cm. No intrahepatic biliary ductal dilatation. Cholecystectomy. Pancreas: The pancreas is unremarkable as visualized. Spleen: Normal in size without focal abnormality. Adrenals/Urinary Tract: The adrenal glands are unremarkable.  There is no hydronephrosis on either side. There is symmetric enhancement and excretion of contrast by both kidneys. The visualized  ureters and urinary bladder appear unremarkable. Stomach/Bowel: There is no bowel obstruction or active inflammation. The appendix is normal. Vascular/Lymphatic: Mild aortoiliac atherosclerotic disease. The IVC is unremarkable. No portal venous gas. Retroperitoneal and porta hepatic adenopathy, new since the prior CT of 02/19/2019. For example an enlarged periportal lymph node measures 2 cm in short axis. Retrocaval lymph node measures 2.1 cm in short axis. Reproductive: The prostate and seminal vesicles are grossly unremarkable. Other: None Musculoskeletal: Multilevel degenerative changes of the spine with disc desiccation and vacuum phenomena. No acute osseous pathology. Review of the MIP images confirms the above findings. IMPRESSION: 1. Findings most consistent with right lower lobe pneumonia and associated reactive mild interstitial edema. Clinical correlation and follow-up to resolution recommended. 2. Interval development of significant thoracic, and retroperitoneal adenopathy as well as extensive hepatic metastatic disease since the CT of 02/19/2019. 3. Cirrhosis with small ascites. 4. Aortic Atherosclerosis (ICD10-I70.0). Electronically Signed   By: Anner Crete M.D.   On: 05/28/2019 18:00   CT ABDOMEN PELVIS W CONTRAST  Result Date: 05/28/2019 CLINICAL DATA:  59 year old male with shortness of breath and generalized pain. History of esophageal cancer. EXAM: CT ANGIOGRAPHY CHEST CT ABDOMEN AND PELVIS WITH CONTRAST TECHNIQUE: Multidetector CT imaging of the chest was performed using the standard protocol during bolus administration of intravenous contrast. Multiplanar CT image reconstructions and MIPs were obtained to evaluate the vascular anatomy. Multidetector CT imaging of the abdomen and pelvis was performed using the standard protocol during bolus administration of intravenous contrast. CONTRAST:  32mL OMNIPAQUE IOHEXOL 350 MG/ML SOLN COMPARISON:  CT of the chest abdomen pelvis dated 02/19/2019.  PET CT dated 05/21/2019. FINDINGS: CTA CHEST FINDINGS Cardiovascular: There is no cardiomegaly or pericardial effusion. Advanced 3 vessel coronary vascular calcification. There is mild atherosclerotic calcification of the thoracic aorta. No aneurysmal dilatation or dissection. Partially visualized Port-A-Cath with tip in the central SVC close to the cavoatrial junction. Evaluation of the pulmonary arteries is very limited due to respiratory motion artifact and suboptimal opacification and timing of the contrast. No large or central pulmonary artery embolus identified. Mediastinum/Nodes: Bilateral hilar and mediastinal adenopathy new since the CT of 02/19/2019. Right hilar lymph nodes measure up to 2.3 cm in short axis. Subcarinal adenopathy measures 2.5 cm in short axis. The esophagus is grossly unremarkable but suboptimally visualized due to mediastinal adenopathy. No mediastinal fluid collection. Lungs/Pleura: Faint area of opacity at the right lung base similar to the PET-CT of 05/21/2019 most consistent with pneumonia. Clinical correlation and follow-up recommended. There is diffuse interstitial and interlobular septal thickening of the right lung with mild nodularity most likely infectious or inflammatory in etiology. There is probably mild associated or reactive interstitial edema in the right lung. The left lung is clear. There is no pleural effusion or pneumothorax. The central airways are patent. Musculoskeletal: Partially visualized right supraclavicular lymph node measures approximately 14 mm (series 4, image 1). Mildly rounded lymph node in the posterior neck deep to the right thyroid lobe (series 4, image 4) measures approximately 12 mm. No definite axillary adenopathy. Mild degenerative changes of the spine. No acute osseous pathology. Review of the MIP images confirms the above findings. CT ABDOMEN and PELVIS FINDINGS No intra-abdominal free air. Small ascites. Hepatobiliary: Cirrhosis. Innumerable  hepatic hypodense lesions, new since the CT of 02/19/2019 consistent with metastatic disease. The largest lesion or confluent of lesions in the  right lobe of the liver measures approximately 6.7 x 7.7 cm. No intrahepatic biliary ductal dilatation. Cholecystectomy. Pancreas: The pancreas is unremarkable as visualized. Spleen: Normal in size without focal abnormality. Adrenals/Urinary Tract: The adrenal glands are unremarkable. There is no hydronephrosis on either side. There is symmetric enhancement and excretion of contrast by both kidneys. The visualized ureters and urinary bladder appear unremarkable. Stomach/Bowel: There is no bowel obstruction or active inflammation. The appendix is normal. Vascular/Lymphatic: Mild aortoiliac atherosclerotic disease. The IVC is unremarkable. No portal venous gas. Retroperitoneal and porta hepatic adenopathy, new since the prior CT of 02/19/2019. For example an enlarged periportal lymph node measures 2 cm in short axis. Retrocaval lymph node measures 2.1 cm in short axis. Reproductive: The prostate and seminal vesicles are grossly unremarkable. Other: None Musculoskeletal: Multilevel degenerative changes of the spine with disc desiccation and vacuum phenomena. No acute osseous pathology. Review of the MIP images confirms the above findings. IMPRESSION: 1. Findings most consistent with right lower lobe pneumonia and associated reactive mild interstitial edema. Clinical correlation and follow-up to resolution recommended. 2. Interval development of significant thoracic, and retroperitoneal adenopathy as well as extensive hepatic metastatic disease since the CT of 02/19/2019. 3. Cirrhosis with small ascites. 4. Aortic Atherosclerosis (ICD10-I70.0). Electronically Signed   By: Anner Crete M.D.   On: 05/28/2019 18:00   NM PET Image Restag (PS) Skull Base To Thigh  Result Date: 05/21/2019 CLINICAL DATA:  Subsequent treatment strategy for distal esophageal cancer. EXAM: NUCLEAR  MEDICINE PET SKULL BASE TO THIGH TECHNIQUE: 15.9 mCi F-18 FDG was injected intravenously. Full-ring PET imaging was performed from the skull base to thigh after the radiotracer. CT data was obtained and used for attenuation correction and anatomic localization. Fasting blood glucose: 91 mg/dl COMPARISON:  CT abdomen/pelvis dated 03/01/2019. PET-CT dated 11/27/2018. FINDINGS: Mediastinal blood pool activity: SUV max 3.4 Liver activity: SUV max NA NECK: No hypermetabolic cervical lymphadenopathy. Focal hypermetabolism within the left pterygoid muscle, max SUV 5.6, although without CT abnormality. This appearance is indeterminate but of questionable clinical significance given the additional findings described below. Incidental CT findings: none CHEST: Patchy/nodular opacity in the posterior right lower lobe (series 3/image 136), max SUV 10.8. This may reflect mild infection/pneumonia, although tumor is not excluded given additional findings. Widespread thoracic nodal metastases, including: --14 mm short axis right supraclavicular node (series 3/image 68), max SUV 13.1 --14 mm short axis right paratracheal node (series 3/image 89), max SUV 14.3 --23 mm short axis subcarinal node (series 3/image 107), max SUV 19.0 --Approximate 24 mm short axis right hilar node (series 3/image 103), max SUV 15.9 --Approximate 17 mm short axis left hilar node (series 3/image 106), max SUV 10.7 No associated hypermetabolism involving the lower esophagus at the GE junction. No associated soft Jewel mass on CT. Incidental CT findings: Right chest port terminates at the cavoatrial junction. Atherosclerotic calcifications of the aortic arch. 3 vessel coronary atherosclerosis. ABDOMEN/PELVIS: Cirrhosis. Although poorly evaluated on unenhanced CT, there is suspected multifocal hepatic metastases with a heterogeneous dominant mass in the posterior right hepatic lobe, max SUV 18.8. Pancreas is poorly evaluated but there is no definite pancreatic  mass, atrophy, or ductal dilatation. No abnormal hypermetabolism in the spleen or adrenal glands. Widespread abdominopelvic nodal metastases, including: --22 mm short axis peripancreatic node in the porta hepatis (series 2/image 162), max SUV 26.5 --27 mm short axis portacaval node (series 3/image 170), max SUV 24.1 --15 mm short axis left para-aortic node (series 3/image 137), max SUV 20.4 --20 mm  short axis right retrocaval node (series 3/image 190), max SUV 24.4 Incidental CT findings: Prior cholecystectomy. Mild atherosclerotic calcifications the abdominal aorta and branch vessels. Dystrophic calcifications involving the prostate. SKELETON: Widespread osseous metastases throughout the visualized axial and appendicular skeleton, including: --Right C1 pedicle, max SUV 7.3 --Sternum, max SUV 7.6 --Left humeral head, max SUV 14.6 --Left lateral 4th rib, max SUV 5.7 --Right T12 vertebral body, max SUV 21.9 ---Left L2 vertebral body, max SUV 17.3 --Left sacrum, max SUV 12.0 --Right iliac bone, max SUV 12.8 --Right anterior process the acetabulum, max SUV 13.2 --Left proximal femur, max SUV 5.9 No evidence of pathologic fracture. Incidental CT findings: Degenerative changes of the visualized thoracolumbar spine. IMPRESSION: No evidence of residual lower esophageal mass or hypermetabolism. Widespread thoracic, abdominal, and retroperitoneal nodal metastases, as above. Widespread osseous metastases, as above. Suspected multifocal hepatic metastases, including a dominant mass in the right hepatic lobe, poorly visualized/evaluated. Underlying cirrhosis. Patchy right lower lobe opacity, favoring pneumonia, tumor not excluded. Technically speaking, while this is presumed to reflect recurrence of the patient's known esophageal cancer, that is not confirmed. Regardless, the patient's right supraclavicular nodes would be amenable to excision/sampling if needed. Additional ancillary findings as above. Electronically Signed   By:  Julian Hy M.D.   On: 05/21/2019 13:45   DG Chest Port 1 View  Result Date: 05/28/2019 CLINICAL DATA:  Generalized aches with shortness of breath. EXAM: PORTABLE CHEST 1 VIEW COMPARISON:  April 27, 2010 FINDINGS: A right-sided venous Port-A-Cath is seen with its distal tip overlying the distal aspect of the superior vena cava. Mild atelectasis and/or infiltrate is seen within the right lung base. There is no evidence of a pleural effusion or pneumothorax. The heart size and mediastinal contours are within normal limits. Degenerative changes seen throughout the thoracic spine. Prominent small bowel loops are suspected within the visualized portion of the upper abdomen. IMPRESSION: 1. Mild atelectasis and/or infiltrate within the right lung base. 2. Prominent small bowel loops within the upper abdomen. Sequelae associated with a partial small bowel obstruction versus ileus cannot be excluded. Correlation with abdominal plain films is recommended. Electronically Signed   By: Virgina Norfolk M.D.   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  Recommend holding starting chemotherapy with Taxol- Cyramza.  Given the active infection.  Discussed that infection has resolved before initiation of chemotherapy.  Again discussed the response rates are about 30%.  And treatments are palliative not curative.  #Right lower lobe pneumonia-community-acquired on ceftriaxone azithromycin.  #Bone metastases-vertebrae ribs-mildly symptomatic-monitor for now.  Consider Zometa.  #Elevated LFTs/right upper quadrant pain likely secondary to progressive malignancy.  Monitor closely.  #Prognosis:  Is guarded given the progressive malignancy in the context of the active pneumonia.  Understand that malignancy would progress or get worse even before we start treatment.  CODE STATUS: I reviewed the CODE STATUS with the patient at length.  Discussed at length the futility of aggressive measures like chest compressions intubation in the context of his progressive malignancy.  Patient undecided.  Currently full code.   Thank you Dr.Shah for allowing me to participate in the care of your pleasant patient. Please do not hesitate to contact me with questions or concerns in the interim.  Discussed with the  patient and wife in detail.  # I reviewed the blood work- with the patient in detail; also reviewed the imaging independently [as summarized above]; and with the patient in detail.   All questions were answered. The patient knows to call the clinic with any problems, questions or concerns.    Cammie Sickle, MD 05/29/2019 1:16 PM

## 2019-05-29 NOTE — ED Notes (Signed)
Pt has lunch tray at bedside  

## 2019-05-29 NOTE — Plan of Care (Signed)
Pt and spouse verbalize understanding of plan of care

## 2019-05-30 ENCOUNTER — Inpatient Hospital Stay: Payer: No Typology Code available for payment source

## 2019-05-30 ENCOUNTER — Inpatient Hospital Stay: Payer: No Typology Code available for payment source | Admitting: Internal Medicine

## 2019-05-30 LAB — HEPATIC FUNCTION PANEL
ALT: 96 U/L — ABNORMAL HIGH (ref 0–44)
AST: 211 U/L — ABNORMAL HIGH (ref 15–41)
Albumin: 2.6 g/dL — ABNORMAL LOW (ref 3.5–5.0)
Alkaline Phosphatase: 286 U/L — ABNORMAL HIGH (ref 38–126)
Bilirubin, Direct: 0.6 mg/dL — ABNORMAL HIGH (ref 0.0–0.2)
Indirect Bilirubin: 1 mg/dL — ABNORMAL HIGH (ref 0.3–0.9)
Total Bilirubin: 1.6 mg/dL — ABNORMAL HIGH (ref 0.3–1.2)
Total Protein: 6.1 g/dL — ABNORMAL LOW (ref 6.5–8.1)

## 2019-05-30 LAB — CBC
HCT: 32.8 % — ABNORMAL LOW (ref 39.0–52.0)
Hemoglobin: 11 g/dL — ABNORMAL LOW (ref 13.0–17.0)
MCH: 30.8 pg (ref 26.0–34.0)
MCHC: 33.5 g/dL (ref 30.0–36.0)
MCV: 91.9 fL (ref 80.0–100.0)
Platelets: 110 10*3/uL — ABNORMAL LOW (ref 150–400)
RBC: 3.57 MIL/uL — ABNORMAL LOW (ref 4.22–5.81)
RDW: 13.5 % (ref 11.5–15.5)
WBC: 6.7 10*3/uL (ref 4.0–10.5)
nRBC: 0 % (ref 0.0–0.2)

## 2019-05-30 LAB — BASIC METABOLIC PANEL
Anion gap: 12 (ref 5–15)
BUN: 14 mg/dL (ref 6–20)
CO2: 20 mmol/L — ABNORMAL LOW (ref 22–32)
Calcium: 8.6 mg/dL — ABNORMAL LOW (ref 8.9–10.3)
Chloride: 104 mmol/L (ref 98–111)
Creatinine, Ser: 0.81 mg/dL (ref 0.61–1.24)
GFR calc Af Amer: >60 mL/min (ref >60)
GFR calc non Af Amer: >60 mL/min (ref >60)
Glucose, Bld: 115 mg/dL — ABNORMAL HIGH (ref 70–99)
Potassium: 4.4 mmol/L (ref 3.5–5.1)
Sodium: 136 mmol/L (ref 135–145)

## 2019-05-30 MED ORDER — MORPHINE SULFATE (PF) 2 MG/ML IV SOLN
2.0000 mg | INTRAVENOUS | Status: DC | PRN
  Administered 2019-05-30 – 2019-06-01 (×8): 2 mg via INTRAVENOUS
  Filled 2019-05-30 (×8): qty 1

## 2019-05-30 MED ORDER — HYDROCOD POLST-CPM POLST ER 10-8 MG/5ML PO SUER
5.0000 mL | Freq: Two times a day (BID) | ORAL | Status: DC
  Administered 2019-05-30 – 2019-06-01 (×3): 5 mL via ORAL
  Filled 2019-05-30 (×4): qty 5

## 2019-05-30 MED ORDER — SENNOSIDES-DOCUSATE SODIUM 8.6-50 MG PO TABS
1.0000 | ORAL_TABLET | Freq: Two times a day (BID) | ORAL | Status: DC
  Administered 2019-05-31 – 2019-06-01 (×3): 1 via ORAL
  Filled 2019-05-30 (×4): qty 1

## 2019-05-30 MED ORDER — POLYETHYLENE GLYCOL 3350 17 G PO PACK
17.0000 g | PACK | Freq: Every day | ORAL | Status: DC
  Administered 2019-05-30 – 2019-06-01 (×3): 17 g via ORAL
  Filled 2019-05-30 (×3): qty 1

## 2019-05-30 MED ORDER — BENZONATATE 100 MG PO CAPS
100.0000 mg | ORAL_CAPSULE | Freq: Three times a day (TID) | ORAL | Status: DC
  Administered 2019-05-30 – 2019-06-01 (×7): 100 mg via ORAL
  Filled 2019-05-30 (×7): qty 1

## 2019-05-30 MED ORDER — SIMETHICONE 80 MG PO CHEW
80.0000 mg | CHEWABLE_TABLET | Freq: Four times a day (QID) | ORAL | Status: DC
  Administered 2019-05-30 – 2019-06-01 (×8): 80 mg via ORAL
  Filled 2019-05-30 (×12): qty 1

## 2019-05-30 MED ORDER — ALUM & MAG HYDROXIDE-SIMETH 200-200-20 MG/5ML PO SUSP
15.0000 mL | ORAL | Status: DC | PRN
  Administered 2019-05-30 (×2): 15 mL via ORAL
  Filled 2019-05-30 (×2): qty 30

## 2019-05-30 MED ORDER — OXYCODONE HCL 5 MG PO TABS
5.0000 mg | ORAL_TABLET | ORAL | Status: DC | PRN
  Administered 2019-05-31: 5 mg via ORAL
  Filled 2019-05-30: qty 1

## 2019-05-30 NOTE — Progress Notes (Signed)
Ch went to visit Pt in response to OR for AD. Pt was asleep. Nurse reported to Ch that his cancer is spreading rapidly, and the AD completion would be helpful. Ch will try again when Pt is awake, and Pt's wife is present.

## 2019-05-30 NOTE — Progress Notes (Signed)
[place] [facility] [place] NAME: Creed Beranek    MR#:  [id number]  DATE OF BIRTH:  [birth year]  SUBJECTIVE:  CHIEF COMPLAINT:   Chief Complaint  Patient presents with  . Shortness of Breath  SOB, tachy REVIEW OF SYSTEMS:  Review of Systems  Constitutional: Positive for malaise/fatigue. Negative for diaphoresis, fever and weight loss.  HENT: Negative.  Negative for ear discharge, ear pain, hearing loss, nosebleeds, sore throat and tinnitus.   Eyes: Negative for blurred vision and pain.  Respiratory: Positive for cough, sputum production and shortness of breath. Negative for hemoptysis and wheezing.   Cardiovascular: Positive for chest pain and palpitations. Negative for orthopnea and leg swelling.  Gastrointestinal: Positive for abdominal pain and constipation. Negative for blood in stool, diarrhea, heartburn, nausea and vomiting.  Genitourinary: Negative for dysuria, frequency and urgency.  Musculoskeletal: Positive for back pain. Negative for myalgias.  Skin: Negative for itching and rash.  Neurological: Negative for dizziness, tingling, tremors, focal weakness, seizures, weakness and headaches.  Psychiatric/Behavioral: Negative for depression. The patient is not nervous/anxious.    DRUG ALLERGIES:  No Known Allergies VITALS:  Blood pressure (!) 168/72, pulse (!) 105, temperature 98.9 F (37.2 C), temperature source Oral, resp. rate (!) 24, height 5\' 10"  (1.778 m), weight 132.9 kg, SpO2 92 %. PHYSICAL EXAMINATION:  Physical Exam HENT:     Head: Normocephalic and atraumatic.  Eyes:     Conjunctiva/sclera: Conjunctivae normal.     Pupils: Pupils are equal, round, and reactive to light.  Neck:     Trachea: No tracheal deviation.  Cardiovascular:     Rate and Rhythm: Normal rate and regular rhythm.     Heart sounds: Normal heart sounds.  Pulmonary:     Effort: Pulmonary effort is normal. No respiratory distress.     Breath sounds: Examination of  the right-lower field reveals decreased breath sounds and rhonchi. Decreased breath sounds and rhonchi present. No wheezing.  Chest:     Chest wall: No tenderness.  Abdominal:     General: Bowel sounds are normal. There is no distension.     Palpations: Abdomen is soft.     Tenderness: There is abdominal tenderness.  Musculoskeletal:        General: Normal range of motion.     Cervical back: Normal range of motion and neck supple.  Skin:    General: Skin is warm and dry.     Findings: No rash.  Neurological:     Mental Status: He is alert and oriented to person, place, and time.     Cranial Nerves: No cranial nerve deficit.    LABORATORY PANEL:  Male CBC Recent Labs  Lab 05/30/19 0503  WBC 6.7  HGB 11.0*  HCT 32.8*  PLT 110*   ------------------------------------------------------------------------------------------------------------------ Chemistries  Recent Labs  Lab 05/30/19 0503  NA 136  K 4.4  CL 104  CO2 20*  GLUCOSE 115*  BUN 14  CREATININE 0.81  CALCIUM 8.6*  AST 211*  ALT 96*  ALKPHOS 286*  BILITOT 1.6*   RADIOLOGY:  No results found. ASSESSMENT AND PLAN:  Jahmeir Huster 59 y.o.  male with a history of metastatic esophageal cancer admitted for pneumonia  1.  Right lower lobe community-acquired pneumonia differential diagnosis including streptococcal or atypicals. -Continue IV Rocephin and Zithromax  -We will follow blood and sputum culture as well as pneumonia antigens. -O2 protocol will be followed. -Mucolytics, as needed and scheduled  duo nebs will be provided.  2.  Sepsis, likely secondary to #1.  This manifested by hypotension, tachycardia and tachypnea, present on admission  -Trend lactic acid level -await blood cultures -Hold hydration with IV normal saline.  3.  Mild acute hypoxic respiratory failure secondary to #1. -2 L oxygen via nasal cannula.  Wean as tolerated  4.  Dyslipidemia. - hold off statin therapy given elevated LFTs.   5.  Hypertension. -Continue Zestril.  6.  GERD. -continue PPI therapy.  7.  Metastatic esophageal cancer. -Followed by Dr. Rogue Bussing at cancer center -consult appreciated..   DVT prophylaxis: Lovenox Family Communication: Updated wife at bedside Disposition Plan: Came from home and will be likely going back to home environment once improved Barriers to DC -none, waiting for clinical improvement  All the records are reviewed and case discussed with Care Management/Social Worker. Management plans discussed with the patient, family and they are in agreement.  CODE STATUS: Full Code  TOTAL TIME TAKING CARE OF THIS PATIENT: 35 minutes.   More than 50% of the time was spent in counseling/coordination of care: YES   Berle Mull M.D on 05/30/2019 at 7:20 PM  [facility]   CC: Primary care physician; Kirk Ruths, MD  Note: This dictation was prepared with Dragon dictation along with smaller phrase technology. Any transcriptional errors that result from this process are unintentional.

## 2019-05-30 NOTE — Progress Notes (Signed)
Matthew Yang   DOB:[birth year]   [id number]    Subjective: Patient complains of gastric discomfort/dyspepsia overnight.  Also complains of right flank pain.  Difficulty sleeping at night because of alarms going off.  Otherwise no worsening cough.  Continued shortness of breath minimal exertion.  Objective:  Vitals:   05/30/19 0944 05/30/19 1147  BP: (!) 168/72   Pulse:    Resp:    Temp: 99.1 F (37.3 C)   SpO2: 90% 94%     Intake/Output Summary (Last 24 hours) at 05/30/2019 1610 Last data filed at 05/30/2019 1355 Gross per 24 hour  Intake 2431.46 ml  Output 900 ml  Net 1531.46 ml    Physical Exam  Constitutional: He is oriented to person, place, and time and well-developed, well-nourished, and in no distress.  2 L of nasal cannula.  HENT:  Head: Normocephalic and atraumatic.  Mouth/Throat: Oropharynx is clear and moist. No oropharyngeal exudate.  Eyes: Pupils are equal, round, and reactive to light.  Cardiovascular: Normal rate and regular rhythm.  Pulmonary/Chest: No respiratory distress. He has no wheezes.  Decreased air entry right more than left.  Abdominal: Soft. Bowel sounds are normal. He exhibits no distension and no mass. There is no abdominal tenderness. There is no rebound and no guarding.  Positive for hepatomegaly.  Tenderness on palpation.  Musculoskeletal:        General: No tenderness or edema. Normal range of motion.     Cervical back: Normal range of motion and neck supple.  Neurological: He is alert and oriented to person, place, and time.  Skin: Skin is warm.  Psychiatric: Affect normal.     Labs:  Lab Results  Component Value Date   WBC 6.7 05/30/2019   HGB 11.0 (L) 05/30/2019   HCT 32.8 (L) 05/30/2019   MCV 91.9 05/30/2019   PLT 110 (L) 05/30/2019   NEUTROABS 5.7 05/28/2019    Lab Results  Component Value Date   NA 136 05/30/2019   K 4.4 05/30/2019   CL 104 05/30/2019   CO2 20 (L) 05/30/2019    Studies:  CT Angio Chest PE W/Cm &/Or Wo  Cm  Result Date: 05/28/2019 CLINICAL DATA:  59 year old male with shortness of breath and generalized pain. History of esophageal cancer. EXAM: CT ANGIOGRAPHY CHEST CT ABDOMEN AND PELVIS WITH CONTRAST TECHNIQUE: Multidetector CT imaging of the chest was performed using the standard protocol during bolus administration of intravenous contrast. Multiplanar CT image reconstructions and MIPs were obtained to evaluate the vascular anatomy. Multidetector CT imaging of the abdomen and pelvis was performed using the standard protocol during bolus administration of intravenous contrast. CONTRAST:  84mL OMNIPAQUE IOHEXOL 350 MG/ML SOLN COMPARISON:  CT of the chest abdomen pelvis dated 02/19/2019. PET CT dated 05/21/2019. FINDINGS: CTA CHEST FINDINGS Cardiovascular: There is no cardiomegaly or pericardial effusion. Advanced 3 vessel coronary vascular calcification. There is mild atherosclerotic calcification of the thoracic aorta. No aneurysmal dilatation or dissection. Partially visualized Port-A-Cath with tip in the central SVC close to the cavoatrial junction. Evaluation of the pulmonary arteries is very limited due to respiratory motion artifact and suboptimal opacification and timing of the contrast. No large or central pulmonary artery embolus identified. Mediastinum/Nodes: Bilateral hilar and mediastinal adenopathy new since the CT of 02/19/2019. Right hilar lymph nodes measure up to 2.3 cm in short axis. Subcarinal adenopathy measures 2.5 cm in short axis. The esophagus is grossly unremarkable but suboptimally visualized due to mediastinal adenopathy. No mediastinal fluid collection. Lungs/Pleura: Faint area of  opacity at the right lung base similar to the PET-CT of 05/21/2019 most consistent with pneumonia. Clinical correlation and follow-up recommended. There is diffuse interstitial and interlobular septal thickening of the right lung with mild nodularity most likely infectious or inflammatory in etiology. There is  probably mild associated or reactive interstitial edema in the right lung. The left lung is clear. There is no pleural effusion or pneumothorax. The central airways are patent. Musculoskeletal: Partially visualized right supraclavicular lymph node measures approximately 14 mm (series 4, image 1). Mildly rounded lymph node in the posterior neck deep to the right thyroid lobe (series 4, image 4) measures approximately 12 mm. No definite axillary adenopathy. Mild degenerative changes of the spine. No acute osseous pathology. Review of the MIP images confirms the above findings. CT ABDOMEN and PELVIS FINDINGS No intra-abdominal free air. Small ascites. Hepatobiliary: Cirrhosis. Innumerable hepatic hypodense lesions, new since the CT of 02/19/2019 consistent with metastatic disease. The largest lesion or confluent of lesions in the right lobe of the liver measures approximately 6.7 x 7.7 cm. No intrahepatic biliary ductal dilatation. Cholecystectomy. Pancreas: The pancreas is unremarkable as visualized. Spleen: Normal in size without focal abnormality. Adrenals/Urinary Tract: The adrenal glands are unremarkable. There is no hydronephrosis on either side. There is symmetric enhancement and excretion of contrast by both kidneys. The visualized ureters and urinary bladder appear unremarkable. Stomach/Bowel: There is no bowel obstruction or active inflammation. The appendix is normal. Vascular/Lymphatic: Mild aortoiliac atherosclerotic disease. The IVC is unremarkable. No portal venous gas. Retroperitoneal and porta hepatic adenopathy, new since the prior CT of 02/19/2019. For example an enlarged periportal lymph node measures 2 cm in short axis. Retrocaval lymph node measures 2.1 cm in short axis. Reproductive: The prostate and seminal vesicles are grossly unremarkable. Other: None Musculoskeletal: Multilevel degenerative changes of the spine with disc desiccation and vacuum phenomena. No acute osseous pathology. Review of  the MIP images confirms the above findings. IMPRESSION: 1. Findings most consistent with right lower lobe pneumonia and associated reactive mild interstitial edema. Clinical correlation and follow-up to resolution recommended. 2. Interval development of significant thoracic, and retroperitoneal adenopathy as well as extensive hepatic metastatic disease since the CT of 02/19/2019. 3. Cirrhosis with small ascites. 4. Aortic Atherosclerosis (ICD10-I70.0). Electronically Signed   By: Anner Crete M.D.   On: 05/28/2019 18:00   CT ABDOMEN PELVIS W CONTRAST  Result Date: 05/28/2019 CLINICAL DATA:  59 year old male with shortness of breath and generalized pain. History of esophageal cancer. EXAM: CT ANGIOGRAPHY CHEST CT ABDOMEN AND PELVIS WITH CONTRAST TECHNIQUE: Multidetector CT imaging of the chest was performed using the standard protocol during bolus administration of intravenous contrast. Multiplanar CT image reconstructions and MIPs were obtained to evaluate the vascular anatomy. Multidetector CT imaging of the abdomen and pelvis was performed using the standard protocol during bolus administration of intravenous contrast. CONTRAST:  72mL OMNIPAQUE IOHEXOL 350 MG/ML SOLN COMPARISON:  CT of the chest abdomen pelvis dated 02/19/2019. PET CT dated 05/21/2019. FINDINGS: CTA CHEST FINDINGS Cardiovascular: There is no cardiomegaly or pericardial effusion. Advanced 3 vessel coronary vascular calcification. There is mild atherosclerotic calcification of the thoracic aorta. No aneurysmal dilatation or dissection. Partially visualized Port-A-Cath with tip in the central SVC close to the cavoatrial junction. Evaluation of the pulmonary arteries is very limited due to respiratory motion artifact and suboptimal opacification and timing of the contrast. No large or central pulmonary artery embolus identified. Mediastinum/Nodes: Bilateral hilar and mediastinal adenopathy new since the CT of 02/19/2019. Right hilar lymph  nodes measure up to 2.3 cm in short axis. Subcarinal adenopathy measures 2.5 cm in short axis. The esophagus is grossly unremarkable but suboptimally visualized due to mediastinal adenopathy. No mediastinal fluid collection. Lungs/Pleura: Faint area of opacity at the right lung base similar to the PET-CT of 05/21/2019 most consistent with pneumonia. Clinical correlation and follow-up recommended. There is diffuse interstitial and interlobular septal thickening of the right lung with mild nodularity most likely infectious or inflammatory in etiology. There is probably mild associated or reactive interstitial edema in the right lung. The left lung is clear. There is no pleural effusion or pneumothorax. The central airways are patent. Musculoskeletal: Partially visualized right supraclavicular lymph node measures approximately 14 mm (series 4, image 1). Mildly rounded lymph node in the posterior neck deep to the right thyroid lobe (series 4, image 4) measures approximately 12 mm. No definite axillary adenopathy. Mild degenerative changes of the spine. No acute osseous pathology. Review of the MIP images confirms the above findings. CT ABDOMEN and PELVIS FINDINGS No intra-abdominal free air. Small ascites. Hepatobiliary: Cirrhosis. Innumerable hepatic hypodense lesions, new since the CT of 02/19/2019 consistent with metastatic disease. The largest lesion or confluent of lesions in the right lobe of the liver measures approximately 6.7 x 7.7 cm. No intrahepatic biliary ductal dilatation. Cholecystectomy. Pancreas: The pancreas is unremarkable as visualized. Spleen: Normal in size without focal abnormality. Adrenals/Urinary Tract: The adrenal glands are unremarkable. There is no hydronephrosis on either side. There is symmetric enhancement and excretion of contrast by both kidneys. The visualized ureters and urinary bladder appear unremarkable. Stomach/Bowel: There is no bowel obstruction or active inflammation. The  appendix is normal. Vascular/Lymphatic: Mild aortoiliac atherosclerotic disease. The IVC is unremarkable. No portal venous gas. Retroperitoneal and porta hepatic adenopathy, new since the prior CT of 02/19/2019. For example an enlarged periportal lymph node measures 2 cm in short axis. Retrocaval lymph node measures 2.1 cm in short axis. Reproductive: The prostate and seminal vesicles are grossly unremarkable. Other: None Musculoskeletal: Multilevel degenerative changes of the spine with disc desiccation and vacuum phenomena. No acute osseous pathology. Review of the MIP images confirms the above findings. IMPRESSION: 1. Findings most consistent with right lower lobe pneumonia and associated reactive mild interstitial edema. Clinical correlation and follow-up to resolution recommended. 2. Interval development of significant thoracic, and retroperitoneal adenopathy as well as extensive hepatic metastatic disease since the CT of 02/19/2019. 3. Cirrhosis with small ascites. 4. Aortic Atherosclerosis (ICD10-I70.0). Electronically Signed   By: Anner Crete M.D.   On: 05/28/2019 18:00   DG Chest Port 1 View  Result Date: 05/28/2019 CLINICAL DATA:  Generalized aches with shortness of breath. EXAM: PORTABLE CHEST 1 VIEW COMPARISON:  April 27, 2010 FINDINGS: A right-sided venous Port-A-Cath is seen with its distal tip overlying the distal aspect of the superior vena cava. Mild atelectasis and/or infiltrate is seen within the right lung base. There is no evidence of a pleural effusion or pneumothorax. The heart size and mediastinal contours are within normal limits. Degenerative changes seen throughout the thoracic spine. Prominent small bowel loops are suspected within the visualized portion of the upper abdomen. IMPRESSION: 1. Mild atelectasis and/or infiltrate within the right lung base. 2. Prominent small bowel loops within the upper abdomen. Sequelae associated with a partial small bowel obstruction versus  ileus cannot be excluded. Correlation with abdominal plain films is recommended. Electronically Signed   By: Virgina Norfolk M.D.   On: 05/28/2019 17:08    Malignant neoplasm of lower third  of esophagus ([place]) 59 year old male patient with a history of metastatic adenocarcinoma [poorly differentiated/question signet ring morphology] lower third of esophagus/GE junction-is currently admitted to hospital for pneumonia.  #Stage IV adenocarcinoma the esophagus-progression noted on recent scans; awaiting resolution of acute issues pneumonia [see below]-prior to considering starting chemotherapy  #Right lower lobe pneumonia-community-overall stable acquired on ceftriaxone azithromycin.  #Abdominal pain/right flank-/elevated LFTs -secondary to hepatomegaly/malignancy-recommend morphine 2 mg every 6 hours as needed.  Encourage the patient to ask for pain medication  #Encouraged the patient to sit in the chair; use incentive spirometer.  Discussed with the patient and his wife in detail.    Cammie Sickle, MD 05/30/2019  4:10 PM

## 2019-05-30 NOTE — Plan of Care (Signed)
Pt is still requiring oxygen at 2 liters to maintain sats greater than 90%. He is DOE and states "I feel like I can't catch my wind" with any activity. Energy conversation encouraged at this time

## 2019-05-31 ENCOUNTER — Telehealth: Payer: Self-pay | Admitting: *Deleted

## 2019-05-31 DIAGNOSIS — I10 Essential (primary) hypertension: Secondary | ICD-10-CM

## 2019-05-31 DIAGNOSIS — E119 Type 2 diabetes mellitus without complications: Secondary | ICD-10-CM

## 2019-05-31 DIAGNOSIS — F419 Anxiety disorder, unspecified: Secondary | ICD-10-CM

## 2019-05-31 DIAGNOSIS — B1920 Unspecified viral hepatitis C without hepatic coma: Secondary | ICD-10-CM

## 2019-05-31 DIAGNOSIS — Z515 Encounter for palliative care: Secondary | ICD-10-CM

## 2019-05-31 DIAGNOSIS — F411 Generalized anxiety disorder: Secondary | ICD-10-CM

## 2019-05-31 DIAGNOSIS — Z79899 Other long term (current) drug therapy: Secondary | ICD-10-CM

## 2019-05-31 DIAGNOSIS — C7951 Secondary malignant neoplasm of bone: Secondary | ICD-10-CM

## 2019-05-31 LAB — CBC WITH DIFFERENTIAL/PLATELET
Abs Immature Granulocytes: 0.16 10*3/uL — ABNORMAL HIGH (ref 0.00–0.07)
Basophils Absolute: 0 10*3/uL (ref 0.0–0.1)
Basophils Relative: 0 %
Eosinophils Absolute: 0.1 10*3/uL (ref 0.0–0.5)
Eosinophils Relative: 1 %
HCT: 33 % — ABNORMAL LOW (ref 39.0–52.0)
Hemoglobin: 11.1 g/dL — ABNORMAL LOW (ref 13.0–17.0)
Immature Granulocytes: 2 %
Lymphocytes Relative: 13 %
Lymphs Abs: 1 10*3/uL (ref 0.7–4.0)
MCH: 30.8 pg (ref 26.0–34.0)
MCHC: 33.6 g/dL (ref 30.0–36.0)
MCV: 91.7 fL (ref 80.0–100.0)
Monocytes Absolute: 1.2 10*3/uL — ABNORMAL HIGH (ref 0.1–1.0)
Monocytes Relative: 16 %
Neutro Abs: 5 10*3/uL (ref 1.7–7.7)
Neutrophils Relative %: 68 %
Platelets: 114 10*3/uL — ABNORMAL LOW (ref 150–400)
RBC: 3.6 MIL/uL — ABNORMAL LOW (ref 4.22–5.81)
RDW: 13.9 % (ref 11.5–15.5)
WBC: 7.4 10*3/uL (ref 4.0–10.5)
nRBC: 0 % (ref 0.0–0.2)

## 2019-05-31 LAB — COMPREHENSIVE METABOLIC PANEL
ALT: 107 U/L — ABNORMAL HIGH (ref 0–44)
AST: 256 U/L — ABNORMAL HIGH (ref 15–41)
Albumin: 2.6 g/dL — ABNORMAL LOW (ref 3.5–5.0)
Alkaline Phosphatase: 288 U/L — ABNORMAL HIGH (ref 38–126)
Anion gap: 11 (ref 5–15)
BUN: 14 mg/dL (ref 6–20)
CO2: 20 mmol/L — ABNORMAL LOW (ref 22–32)
Calcium: 8.7 mg/dL — ABNORMAL LOW (ref 8.9–10.3)
Chloride: 103 mmol/L (ref 98–111)
Creatinine, Ser: 0.76 mg/dL (ref 0.61–1.24)
GFR calc Af Amer: >60 mL/min (ref >60)
GFR calc non Af Amer: >60 mL/min (ref >60)
Glucose, Bld: 109 mg/dL — ABNORMAL HIGH (ref 70–99)
Potassium: 4.5 mmol/L (ref 3.5–5.1)
Sodium: 134 mmol/L — ABNORMAL LOW (ref 135–145)
Total Bilirubin: 2.4 mg/dL — ABNORMAL HIGH (ref 0.3–1.2)
Total Protein: 6.3 g/dL — ABNORMAL LOW (ref 6.5–8.1)

## 2019-05-31 MED ORDER — LORAZEPAM 2 MG/ML IJ SOLN
1.0000 mg | Freq: Three times a day (TID) | INTRAMUSCULAR | Status: DC | PRN
  Administered 2019-06-01: 1 mg via INTRAVENOUS
  Filled 2019-05-31: qty 1

## 2019-05-31 MED ORDER — AZITHROMYCIN 500 MG PO TABS
500.0000 mg | ORAL_TABLET | Freq: Every day | ORAL | Status: DC
  Administered 2019-06-01: 500 mg via ORAL
  Filled 2019-05-31: qty 1

## 2019-05-31 MED ORDER — CEFDINIR 300 MG PO CAPS
300.0000 mg | ORAL_CAPSULE | Freq: Two times a day (BID) | ORAL | Status: DC
  Administered 2019-05-31 – 2019-06-01 (×2): 300 mg via ORAL
  Filled 2019-05-31 (×3): qty 1

## 2019-05-31 NOTE — Consult Note (Signed)
[place]  [phone] [phone]   Name: Matthew Yang Date: 05/31/2019 MRN: [id number]  DOB: Feb 02, 1961  Patient Care Team: Kirk Ruths, MD as PCP - General (Internal Medicine) Lucky Cowboy, Erskine Squibb, MD as Consulting Physician (Vascular Surgery) Clent Jacks, RN as Registered Nurse Alfonzo Feller, RN as [place] Management    REASON FOR CONSULTATION: Carnelius Hammitt is a 59 y.o. 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  Patient was admitted to the hospital on 05/28/2019 with pneumonia after having 4 to 5 days of progressive dyspnea.  Hospitalization has been complicated by persistent exertional dyspnea/hypoxia.  Palliative care was consulted help address goals and manage ongoing symptoms.  SOCIAL HISTORY:     reports that he has never smoked. He has never used smokeless tobacco. He reports previous alcohol use. He reports that he does not use drugs.   Patient is married and lives at home with his wife.  He has no biological children but helped raise 2 step children.  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  ADVANCE DIRECTIVES:  Does not have  CODE STATUS: Full code  PAST MEDICAL HISTORY: Past Medical History:  Diagnosis Date   Diabetes mellitus without complication ([place])    Esophagus cancer ([place])    Hepatitis    HEPATITIS C   Hypercholesteremia    Hypertension    Morbid obesity ([place])    Positive hepatitis C antibody test    Sleep apnea    Tubular adenoma of colon    polyp    PAST SURGICAL HISTORY:  Past Surgical History:  Procedure Laterality Date   CHOLECYSTECTOMY     COLONOSCOPY     COLONOSCOPY WITH PROPOFOL N/A 05/04/2016   Procedure: COLONOSCOPY WITH PROPOFOL;  Surgeon: Lollie Sails, MD;  Location[facility] ENDOSCOPY;  Service: Endoscopy;   Laterality: N/A;   ESOPHAGOGASTRODUODENOSCOPY N/A 05/03/2019   Procedure: ESOPHAGOGASTRODUODENOSCOPY (EGD);  Surgeon: Toledo, Benay Pike, MD;  Location: [facility] ENDOSCOPY;  Service: Gastroenterology;  Laterality: N/A;   ESOPHAGOGASTRODUODENOSCOPY (EGD) WITH PROPOFOL N/A 08/04/2018   Procedure: ESOPHAGOGASTRODUODENOSCOPY (EGD) WITH PROPOFOL;  Surgeon: Lollie Sails, MD;  Location: [facility] ENDOSCOPY;  Service: Endoscopy;  Laterality: N/A;   PORTA CATH INSERTION N/A 08/17/2018   Procedure: PORTA CATH INSERTION;  Surgeon: Algernon Huxley, MD;  Location: [place] CV LAB;  Service: Cardiovascular;  Laterality: N/A;    HEMATOLOGY/ONCOLOGY HISTORY:  Oncology History Overview Note  # June 2020-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  # June rd week- 5FU pump every 2 days; +RT [until aug 4th]  # 10/16/2018-FOLFOX #1; 9/21-PET- PR. 11/28/2018- cycle #6-decreased the ox dose to 60 mg/2 [sec to platelets- 94]. S/p 12 cycles of FOLFOX; DEC 15th 2020- switched to 5 CIV ONLY; dced Oxaliplatin.   # MARCH 15th 2021-progression of disease-liver mets bone mets/thoracic abdominal retroperitoneal lymph nodes  # MARCH 23,2021- TAX+RAM   # MARCH 2021- Bone mets- Zometa  # DM- diet  controlled; Hepatitis C; September 2020-right kidney stone/spontaneous; foreign body impaction/spontaneous  # EGD-[Dr.Toledo] FEB 2021-complete response to tumor  # NGS/omniseq- PDL-1 CPS 20%;EGFR-copy number gain;  NEG- targets/ Her-2 neu.   # PALLIATIVE CARE: P  DIAGNOSIS: Adenocarcinoma esophagus/GE junction  STAGE: IV     ;GOALS: pallaitive  CURRENT/MOST RECENT THERAPY: TAX+RAM [C]      Malignant neoplasm of lower third of esophagus ([place])  08/30/2018 - 05/21/2019 Chemotherapy   The patient had palonosetron (ALOXI) injection 0.25 mg, 0.25 mg, Intravenous,  Once, 9 of 9 cycles Administration: 0.25 mg (10/30/2018), 0.25 mg (10/16/2018), 0.25 mg (11/28/2018), 0.25 mg (12/12/2018), 0.25 mg (11/14/2018), 0.25 mg (12/26/2018), 0.25 mg (01/09/2019), 0.25 mg (01/23/2019), 0.25 mg (02/06/2019) leucovorin 1,072 mg in dextrose 5 % 250 mL infusion, 400 mg/m2 = 1,072 mg, Intravenous,  Once, 11 of 11 cycles Administration: 1,000 mg (10/30/2018), 1,000 mg (10/16/2018), 1,000 mg (11/28/2018), 1,000 mg (12/12/2018), 1,000 mg (12/26/2018), 1,000 mg (01/09/2019), 1,000 mg (01/23/2019), 1,000 mg (02/06/2019) oxaliplatin (ELOXATIN) 230 mg in dextrose 5 % 500 mL chemo infusion, 85 mg/m2 = 230 mg, Intravenous,  Once, 9 of 9 cycles Dose modification: 60 mg/m2 (original dose 85 mg/m2, Cycle 7, Reason: Provider Judgment) Administration: 230 mg (10/30/2018), 230 mg (10/16/2018), 160 mg (11/28/2018), 160 mg (12/12/2018), 230 mg (11/14/2018), 160 mg (12/26/2018), 160 mg (01/09/2019), 160 mg (01/23/2019), 160 mg (02/06/2019) leucovorin injection 54 mg, 20 mg/m2 = 54 mg (100 % of original dose 20 mg/m2), Intravenous,  Once, 8 of 8 cycles Dose modification: 20 mg/m2 (original dose 20 mg/m2, Cycle 2, Reason: Other (see comments), Comment: iv push), 20 mg/m2 (original dose 20 mg/m2, Cycle 3, Reason: Other (see comments), Comment: ivp) Administration: 54 mg (09/13/2018), 54 mg (10/02/2018) fluorouracil (ADRUCIL) 6,450 mg in sodium chloride 0.9 % 121 mL chemo infusion, 2,400 mg/m2 = 6,450 mg, Intravenous, 1 Day/Dose, 18 of 20 cycles Administration: 6,450 mg (08/30/2018), 6,450 mg (09/13/2018), 6,450 mg (10/30/2018), 6,450 mg (10/02/2018), 6,450 mg (10/16/2018), 6,450  mg (11/28/2018), 6,450 mg (12/12/2018), 6,450 mg (11/14/2018), 6,450 mg (12/26/2018), 6,450 mg (01/09/2019), 6,450 mg (01/23/2019), 6,450 mg (02/06/2019), 6,450 mg (02/20/2019), 6,450 mg (03/06/2019), 6,450 mg (03/20/2019), 6,450 mg (04/03/2019), 6,450 mg (04/17/2019), 6,450 mg (05/08/2019)  for chemotherapy treatment.    05/30/2019 -  Chemotherapy   The patient had PACLitaxel (TAXOL) 204 mg in sodium chloride 0.9 % 250 mL chemo infusion (</= 7m/m2), 80 mg/m2, Intravenous,  Once, 0 of 6 cycles ramucirumab (CYRAMZA) 1,100 mg in sodium chloride 0.9 % 140 mL chemo infusion, 8 mg/kg, Intravenous, Once, 0 of 6 cycles  for chemotherapy treatment.      ALLERGIES:  has No Known Allergies.  MEDICATIONS:  Current Facility-Administered Medications  Medication Dose Route Frequency Provider Last Rate Last Admin   0.9 %  sodium chloride infusion   Intravenous PRN OLang Snow NP   Stopped at 05/30/19 0120   acetaminophen (TYLENOL) tablet 650 mg  650 mg Oral Q6H PRN Mansy, Jan A, MD       Or   acetaminophen (TYLENOL) suppository 650 mg  650 mg Rectal Q6H PRN Mansy, Jan A, MD       alum & mag hydroxide-simeth (MAALOX/MYLANTA) 200-200-20 MG/5ML suspension 15 mL  15 mL Oral Q3H PRN PLavina Hamman MD   15 mL at 05/30/19 2107   azithromycin (ZITHROMAX) 500 mg in sodium chloride 0.9 % 250 mL IVPB  500 mg Intravenous Q24H Mansy, Jan A, MD   Stopped at 05/30/19 2224   benzonatate (TESSALON) capsule 100 mg  100 mg Oral TID Lavina Hamman, MD   100 mg at 05/31/19 1655   cefTRIAXone (ROCEPHIN) 2 g in sodium chloride 0.9 % 100 mL IVPB  2 g Intravenous Q24H Mansy, Jan A, MD   Stopped at 05/30/19 2143   chlorpheniramine-HYDROcodone (TUSSIONEX) 10-8 MG/5ML suspension 5 mL  5 mL Oral Q12H Lavina Hamman, MD   5 mL at 05/30/19 2126   enoxaparin (LOVENOX) injection 40 mg  40 mg Subcutaneous BID Mansy, Jan A, MD   40 mg at 05/31/19 0950   guaiFENesin (MUCINEX) 12 hr tablet 600 mg  600 mg Oral BID Mansy, Jan A,  MD   600 mg at 05/31/19 1000   ipratropium-albuterol (DUONEB) 0.5-2.5 (3) MG/3ML nebulizer solution 3 mL  3 mL Nebulization QID Mansy, Jan A, MD   3 mL at 05/31/19 1605   lisinopril (ZESTRIL) tablet 20 mg  20 mg Oral Daily Mansy, Jan A, MD   20 mg at 05/31/19 0949   LORazepam (ATIVAN) injection 1 mg  1 mg Intravenous Q8H PRN Cammie Sickle, MD       magnesium hydroxide (MILK OF MAGNESIA) suspension 30 mL  30 mL Oral Daily PRN Mansy, Jan A, MD       morphine 2 MG/ML injection 2-4 mg  2-4 mg Intravenous Q4H PRN Lavina Hamman, MD   2 mg at 05/31/19 1656   multivitamin with minerals tablet 1 tablet  1 tablet Oral Daily Mansy, Jan A, MD   1 tablet at 05/31/19 0950   omega-3 acid ethyl esters (LOVAZA) capsule 1 g  1 g Oral Daily Mansy, Jan A, MD   1 g at 05/31/19 0948   ondansetron (ZOFRAN) tablet 4 mg  4 mg Oral Q6H PRN Mansy, Jan A, MD   4 mg at 05/30/19 2104   Or   ondansetron [facility]) injection 4 mg  4 mg Intravenous Q6H PRN Mansy, Jan A, MD   4 mg at 05/29/19 2151   oxyCODONE (Oxy IR/ROXICODONE) immediate release tablet 5 mg  5 mg Oral Q3H PRN Lavina Hamman, MD   5 mg at 05/31/19 0949   pantoprazole (PROTONIX) EC tablet 40 mg  40 mg Oral Daily Mansy, Jan A, MD   40 mg at 05/31/19 0949   polyethylene glycol (MIRALAX / GLYCOLAX) packet 17 g  17 g Oral Daily Lavina Hamman, MD   17 g at 05/31/19 0946   senna-docusate (Senokot-S) tablet 1 tablet  1 tablet Oral BID Lavina Hamman, MD   1 tablet at 05/31/19 0948   simethicone (MYLICON) chewable tablet 80 mg  80 mg Oral QID Lavina Hamman, MD   80 mg at 05/31/19 1655   traZODone (DESYREL) tablet 25 mg  25 mg Oral QHS PRN Mansy, Jan A, MD   25 mg at 05/30/19 2105    VITAL SIGNS: BP (!) 148/69    Pulse (!) 105    Temp 98.4 F (36.9 C) (Oral)    Resp (!) 23    Ht _0  (1.778 m)    Wt 293 lb (132.9 kg)    SpO2 95%    BMI 42.04 kg/m  Filed Weights   05/28/19 1612 05/29/19 1546  Weight: 293 lb (132.9 kg) 293 lb (132.9 kg)     Estimated body mass index is 42.04 kg/m as calculated from the following:   Height as of this encounter: _1  (1.778 m).   Weight as of this encounter: 293 lb (132.9 kg).  LABS: CBC:    Component Value Date/Time   WBC 7.4 05/31/2019 0524   HGB 11.1 (L) 05/31/2019 0524   HCT 33.0 (L) 05/31/2019 0524   PLT 114 (L) 05/31/2019 0524   MCV 91.7 05/31/2019 0524   NEUTROABS 5.0 05/31/2019 0524   LYMPHSABS 1.0 05/31/2019 0524   MONOABS 1.2 (H) 05/31/2019 0524   EOSABS 0.1 05/31/2019 0524   BASOSABS 0.0 05/31/2019 0524   Comprehensive Metabolic Panel:    Component Value Date/Time   NA 134 (L) 05/31/2019 0524   K 4.5 05/31/2019 0524   CL 103 05/31/2019 0524   CO2 20 (L) 05/31/2019 0524   BUN 14 05/31/2019 0524   CREATININE 0.76 05/31/2019 0524   GLUCOSE 109 (H) 05/31/2019 0524   CALCIUM 8.7 (L) 05/31/2019 0524   AST 256 (H) 05/31/2019 0524   ALT 107 (H) 05/31/2019 0524   ALKPHOS 288 (H) 05/31/2019 0524   BILITOT 2.4 (H) 05/31/2019 0524   BILITOT 0.8 11/12/2014 1317   PROT 6.3 (L) 05/31/2019 0524   PROT 7.3 11/12/2014 1317   ALBUMIN 2.6 (L) 05/31/2019 0524   ALBUMIN 4.5 11/12/2014 1317    RADIOGRAPHIC STUDIES: CT Angio Chest PE W/Cm &/Or Wo Cm  Result Date: 05/28/2019 CLINICAL DATA:  59 year old male with shortness of breath and generalized pain. History of esophageal cancer. EXAM: CT ANGIOGRAPHY CHEST CT ABDOMEN AND PELVIS WITH CONTRAST TECHNIQUE: Multidetector CT imaging of the chest was performed using the standard protocol during bolus administration of intravenous contrast. Multiplanar CT image reconstructions and MIPs were obtained to evaluate the vascular anatomy. Multidetector CT imaging of the abdomen and pelvis was performed using the standard protocol during bolus administration of intravenous contrast. CONTRAST:  13m OMNIPAQUE IOHEXOL 350 MG/ML SOLN COMPARISON:  CT of the chest abdomen pelvis dated 02/19/2019. PET CT dated 05/21/2019. FINDINGS: CTA CHEST FINDINGS  Cardiovascular: There is no cardiomegaly or pericardial effusion. Advanced 3 vessel coronary vascular calcification. There is mild atherosclerotic calcification of the thoracic aorta. No aneurysmal dilatation or dissection. Partially visualized Port-A-Cath with tip in the central SVC close to the cavoatrial junction. Evaluation of the pulmonary arteries is very limited due to respiratory motion artifact and suboptimal opacification and timing of the contrast. No large or central pulmonary artery embolus identified. Mediastinum/Nodes: Bilateral hilar and mediastinal adenopathy new since the CT of 02/19/2019. Right hilar lymph nodes measure up to 2.3 cm in short axis. Subcarinal adenopathy measures 2.5 cm in short axis. The esophagus is grossly unremarkable but suboptimally visualized due to mediastinal adenopathy. No mediastinal fluid collection. Lungs/Pleura: Faint area of opacity at the right lung base similar to the PET-CT of 05/21/2019 most consistent with pneumonia. Clinical correlation and follow-up recommended. There is diffuse interstitial and interlobular septal thickening of the right lung with mild nodularity most likely infectious or inflammatory in etiology. There is probably mild associated or reactive interstitial edema in the right lung. The left lung is clear. There is no pleural effusion or pneumothorax. The central airways are patent. Musculoskeletal: Partially visualized right supraclavicular lymph node measures approximately 14 mm (series 4, image 1). Mildly rounded lymph node in the posterior neck deep to the right thyroid lobe (series 4, image 4) measures approximately 12 mm. No definite axillary adenopathy. Mild degenerative changes of the spine. No acute osseous pathology. Review of the MIP images confirms the above findings. CT ABDOMEN and PELVIS FINDINGS No intra-abdominal free air. Small ascites. Hepatobiliary: Cirrhosis. Innumerable hepatic hypodense lesions, new since the CT of  02/19/2019 consistent with metastatic  disease. The largest lesion or confluent of lesions in the right lobe of the liver measures approximately 6.7 x 7.7 cm. No intrahepatic biliary ductal dilatation. Cholecystectomy. Pancreas: The pancreas is unremarkable as visualized. Spleen: Normal in size without focal abnormality. Adrenals/Urinary Tract: The adrenal glands are unremarkable. There is no hydronephrosis on either side. There is symmetric enhancement and excretion of contrast by both kidneys. The visualized ureters and urinary bladder appear unremarkable. Stomach/Bowel: There is no bowel obstruction or active inflammation. The appendix is normal. Vascular/Lymphatic: Mild aortoiliac atherosclerotic disease. The IVC is unremarkable. No portal venous gas. Retroperitoneal and porta hepatic adenopathy, new since the prior CT of 02/19/2019. For example an enlarged periportal lymph node measures 2 cm in short axis. Retrocaval lymph node measures 2.1 cm in short axis. Reproductive: The prostate and seminal vesicles are grossly unremarkable. Other: None Musculoskeletal: Multilevel degenerative changes of the spine with disc desiccation and vacuum phenomena. No acute osseous pathology. Review of the MIP images confirms the above findings. IMPRESSION: 1. Findings most consistent with right lower lobe pneumonia and associated reactive mild interstitial edema. Clinical correlation and follow-up to resolution recommended. 2. Interval development of significant thoracic, and retroperitoneal adenopathy as well as extensive hepatic metastatic disease since the CT of 02/19/2019. 3. Cirrhosis with small ascites. 4. Aortic Atherosclerosis (ICD10-I70.0). Electronically Signed   By: Anner Crete M.D.   On: 05/28/2019 18:00   CT ABDOMEN PELVIS W CONTRAST  Result Date: 05/28/2019 CLINICAL DATA:  59 year old male with shortness of breath and generalized pain. History of esophageal cancer. EXAM: CT ANGIOGRAPHY CHEST CT ABDOMEN AND  PELVIS WITH CONTRAST TECHNIQUE: Multidetector CT imaging of the chest was performed using the standard protocol during bolus administration of intravenous contrast. Multiplanar CT image reconstructions and MIPs were obtained to evaluate the vascular anatomy. Multidetector CT imaging of the abdomen and pelvis was performed using the standard protocol during bolus administration of intravenous contrast. CONTRAST:  77m OMNIPAQUE IOHEXOL 350 MG/ML SOLN COMPARISON:  CT of the chest abdomen pelvis dated 02/19/2019. PET CT dated 05/21/2019. FINDINGS: CTA CHEST FINDINGS Cardiovascular: There is no cardiomegaly or pericardial effusion. Advanced 3 vessel coronary vascular calcification. There is mild atherosclerotic calcification of the thoracic aorta. No aneurysmal dilatation or dissection. Partially visualized Port-A-Cath with tip in the central SVC close to the cavoatrial junction. Evaluation of the pulmonary arteries is very limited due to respiratory motion artifact and suboptimal opacification and timing of the contrast. No large or central pulmonary artery embolus identified. Mediastinum/Nodes: Bilateral hilar and mediastinal adenopathy new since the CT of 02/19/2019. Right hilar lymph nodes measure up to 2.3 cm in short axis. Subcarinal adenopathy measures 2.5 cm in short axis. The esophagus is grossly unremarkable but suboptimally visualized due to mediastinal adenopathy. No mediastinal fluid collection. Lungs/Pleura: Faint area of opacity at the right lung base similar to the PET-CT of 05/21/2019 most consistent with pneumonia. Clinical correlation and follow-up recommended. There is diffuse interstitial and interlobular septal thickening of the right lung with mild nodularity most likely infectious or inflammatory in etiology. There is probably mild associated or reactive interstitial edema in the right lung. The left lung is clear. There is no pleural effusion or pneumothorax. The central airways are patent.  Musculoskeletal: Partially visualized right supraclavicular lymph node measures approximately 14 mm (series 4, image 1). Mildly rounded lymph node in the posterior neck deep to the right thyroid lobe (series 4, image 4) measures approximately 12 mm. No definite axillary adenopathy. Mild degenerative changes of the spine. No acute  osseous pathology. Review of the MIP images confirms the above findings. CT ABDOMEN and PELVIS FINDINGS No intra-abdominal free air. Small ascites. Hepatobiliary: Cirrhosis. Innumerable hepatic hypodense lesions, new since the CT of 02/19/2019 consistent with metastatic disease. The largest lesion or confluent of lesions in the right lobe of the liver measures approximately 6.7 x 7.7 cm. No intrahepatic biliary ductal dilatation. Cholecystectomy. Pancreas: The pancreas is unremarkable as visualized. Spleen: Normal in size without focal abnormality. Adrenals/Urinary Tract: The adrenal glands are unremarkable. There is no hydronephrosis on either side. There is symmetric enhancement and excretion of contrast by both kidneys. The visualized ureters and urinary bladder appear unremarkable. Stomach/Bowel: There is no bowel obstruction or active inflammation. The appendix is normal. Vascular/Lymphatic: Mild aortoiliac atherosclerotic disease. The IVC is unremarkable. No portal venous gas. Retroperitoneal and porta hepatic adenopathy, new since the prior CT of 02/19/2019. For example an enlarged periportal lymph node measures 2 cm in short axis. Retrocaval lymph node measures 2.1 cm in short axis. Reproductive: The prostate and seminal vesicles are grossly unremarkable. Other: None Musculoskeletal: Multilevel degenerative changes of the spine with disc desiccation and vacuum phenomena. No acute osseous pathology. Review of the MIP images confirms the above findings. IMPRESSION: 1. Findings most consistent with right lower lobe pneumonia and associated reactive mild interstitial edema. Clinical  correlation and follow-up to resolution recommended. 2. Interval development of significant thoracic, and retroperitoneal adenopathy as well as extensive hepatic metastatic disease since the CT of 02/19/2019. 3. Cirrhosis with small ascites. 4. Aortic Atherosclerosis (ICD10-I70.0). Electronically Signed   By: Anner Crete M.D.   On: 05/28/2019 18:00   NM PET Image Restag (PS) Skull Base To Thigh  Result Date: 05/21/2019 CLINICAL DATA:  Subsequent treatment strategy for distal esophageal cancer. EXAM: NUCLEAR MEDICINE PET SKULL BASE TO THIGH TECHNIQUE: 15.9 mCi F-18 FDG was injected intravenously. Full-ring PET imaging was performed from the skull base to thigh after the radiotracer. CT data was obtained and used for attenuation correction and anatomic localization. Fasting blood glucose: 91 mg/dl COMPARISON:  CT abdomen/pelvis dated 03/01/2019. PET-CT dated 11/27/2018. FINDINGS: Mediastinal blood pool activity: SUV max 3.4 Liver activity: SUV max NA NECK: No hypermetabolic cervical lymphadenopathy. Focal hypermetabolism within the left pterygoid muscle, max SUV 5.6, although without CT abnormality. This appearance is indeterminate but of questionable clinical significance given the additional findings described below. Incidental CT findings: none CHEST: Patchy/nodular opacity in the posterior right lower lobe (series 3/image 136), max SUV 10.8. This may reflect mild infection/pneumonia, although tumor is not excluded given additional findings. Widespread thoracic nodal metastases, including: --14 mm short axis right supraclavicular node (series 3/image 68), max SUV 13.1 --14 mm short axis right paratracheal node (series 3/image 89), max SUV 14.3 --23 mm short axis subcarinal node (series 3/image 107), max SUV 19.0 --Approximate 24 mm short axis right hilar node (series 3/image 103), max SUV 15.9 --Approximate 17 mm short axis left hilar node (series 3/image 106), max SUV 10.7 No associated hypermetabolism  involving the lower esophagus at the GE junction. No associated soft Jewel mass on CT. Incidental CT findings: Right chest port terminates at the cavoatrial junction. Atherosclerotic calcifications of the aortic arch. 3 vessel coronary atherosclerosis. ABDOMEN/PELVIS: Cirrhosis. Although poorly evaluated on unenhanced CT, there is suspected multifocal hepatic metastases with a heterogeneous dominant mass in the posterior right hepatic lobe, max SUV 18.8. Pancreas is poorly evaluated but there is no definite pancreatic mass, atrophy, or ductal dilatation. No abnormal hypermetabolism in the spleen or adrenal glands. Widespread  abdominopelvic nodal metastases, including: --22 mm short axis peripancreatic node in the porta hepatis (series 2/image 162), max SUV 26.5 --27 mm short axis portacaval node (series 3/image 170), max SUV 24.1 --15 mm short axis left para-aortic node (series 3/image 137), max SUV 20.4 --20 mm short axis right retrocaval node (series 3/image 190), max SUV 24.4 Incidental CT findings: Prior cholecystectomy. Mild atherosclerotic calcifications the abdominal aorta and branch vessels. Dystrophic calcifications involving the prostate. SKELETON: Widespread osseous metastases throughout the visualized axial and appendicular skeleton, including: --Right C1 pedicle, max SUV 7.3 --Sternum, max SUV 7.6 --Left humeral head, max SUV 14.6 --Left lateral 4th rib, max SUV 5.7 --Right T12 vertebral body, max SUV 21.9 ---Left L2 vertebral body, max SUV 17.3 --Left sacrum, max SUV 12.0 --Right iliac bone, max SUV 12.8 --Right anterior process the acetabulum, max SUV 13.2 --Left proximal femur, max SUV 5.9 No evidence of pathologic fracture. Incidental CT findings: Degenerative changes of the visualized thoracolumbar spine. IMPRESSION: No evidence of residual lower esophageal mass or hypermetabolism. Widespread thoracic, abdominal, and retroperitoneal nodal metastases, as above. Widespread osseous metastases, as  above. Suspected multifocal hepatic metastases, including a dominant mass in the right hepatic lobe, poorly visualized/evaluated. Underlying cirrhosis. Patchy right lower lobe opacity, favoring pneumonia, tumor not excluded. Technically speaking, while this is presumed to reflect recurrence of the patient's known esophageal cancer, that is not confirmed. Regardless, the patient's right supraclavicular nodes would be amenable to excision/sampling if needed. Additional ancillary findings as above. Electronically Signed   By: Julian Hy M.D.   On: 05/21/2019 13:45   DG Chest Port 1 View  Result Date: 05/28/2019 CLINICAL DATA:  Generalized aches with shortness of breath. EXAM: PORTABLE CHEST 1 VIEW COMPARISON:  April 27, 2010 FINDINGS: A right-sided venous Port-A-Cath is seen with its distal tip overlying the distal aspect of the superior vena cava. Mild atelectasis and/or infiltrate is seen within the right lung base. There is no evidence of a pleural effusion or pneumothorax. The heart size and mediastinal contours are within normal limits. Degenerative changes seen throughout the thoracic spine. Prominent small bowel loops are suspected within the visualized portion of the upper abdomen. IMPRESSION: 1. Mild atelectasis and/or infiltrate within the right lung base. 2. Prominent small bowel loops within the upper abdomen. Sequelae associated with a partial small bowel obstruction versus ileus cannot be excluded. Correlation with abdominal plain films is recommended. Electronically Signed   By: Virgina Norfolk M.D.   On: 05/28/2019 17:08    PERFORMANCE STATUS (ECOG) : 3 - Symptomatic, >50% confined to bed  Review of Systems Unless otherwise noted, a complete review of systems is negative.  Physical Exam General: NAD Pulmonary: Unlabored Abdomen: Hepatomegaly noted but not visualized Extremities: no edema, no joint deformities Skin: no rashes Neurological: Weakness but otherwise  nonfocal  IMPRESSION: I met today with patient and his wife.  I introduced palliative care services and attempted to establish therapeutic rapport.  Patient is able to verbalize an understanding of his current medical problems.  He has met with Dr. Rogue Bussing and recognizes that his overall health status may be such that treatment options could be limited in the event of decline.  However, both patient and wife are hopeful that he will improve and be able to resume treatment for his cancer.  They are in agreement with the current scope of treatment while he is in the hospital.  Patient spoke at length about his love of work.  His primary goal would be to return  to work so that he can transition his job responsibilities to someone else if it becomes apparent that he will no longer be able to maintain his present occupational status.  Patient and wife met earlier today with the chaplain.  Patient completed HCPOA/living will documents.  However, these will have to be notarized after patient leaves the hospital.  We did discuss CODE STATUS at length. They are undecided about CODE STATUS decisions today but patient said that he did not think he wanted aggressive measures at end-of-life if care was thought to be futile.  Symptomatically, patient endorses DOE and anxiety.  Agree with use of lorazepam and morphine as already ordered.  Patient may also benefit from initiation of an SSRI for anxiety.  He is receiving trazodone to help him sleep at night.  Regarding pain, I would consider starting steroids if felt to be clinically tolerable, as patient likely has some component of capsular pain given his hepatic metastases.  PLAN: -Full scope/full code -Continue morphine/lorazepam as needed for comfort -Consider starting dexamethasone 4 mg p.o. daily for capsular pain -Consider starting citalopram 100m daily -ACP documents completed by chaplain -Will follow  Case and plan discussed with Dr.  BRogue Bussing  Time Total: 75 minutes  Visit consisted of counseling and education dealing with the complex and emotionally intense issues of symptom management and palliative care in the setting of serious and potentially life-threatening illness.Greater than 50%  of this time was spent counseling and coordinating care related to the above assessment and plan.  Signed by: JAltha Harm PhD, NP-C

## 2019-05-31 NOTE — Progress Notes (Signed)
Pt up with PT at 1130. Changes in hr and BP changed MEWS score to a 4. Pt reassessed at 1230 New vitals while resting  HR 111 BP 119/90  23. All vitals done while patient at rest. Current MEWS back to a yellow which is where patient has been since arrival to this unit.

## 2019-05-31 NOTE — Progress Notes (Signed)
Matthew Yang   DOB:10-25-1960   [id number]    Subjective: Patient is quite anxious given his lack of improvement since admission to hospital.  He continues to feel short of breath on minimal exertion.  No worsening cough and no fevers.  Complains of continued dyspepsia; nausea.  Abdominal pain is slightly improved while taking morphine.  Had multiple questions regarding his overall care moving forward.  Objective:  Vitals:   05/31/19 0005 05/31/19 0800  BP:  (!) 148/69  Pulse:    Resp:  (!) 23  Temp: 98.4 F (36.9 C)   SpO2:       Intake/Output Summary (Last 24 hours) at 05/31/2019 [id number] Last data filed at 05/31/2019 0600 Gross per 24 hour  Intake 589.84 ml  Output 200 ml  Net 389.84 ml    Physical Exam  Constitutional: He is oriented to person, place, and time and well-developed, well-nourished, and in no distress.  Anxious.  Accompanied by his wife.  HENT:  Head: Normocephalic and atraumatic.  Mouth/Throat: Oropharynx is clear and moist. No oropharyngeal exudate.  Eyes: Pupils are equal, round, and reactive to light.  Cardiovascular: Regular rhythm.  Tachycardic.  Pulmonary/Chest: No respiratory distress. He has no wheezes.  Abdominal: Soft. Bowel sounds are normal. He exhibits no distension and no mass. There is abdominal tenderness. There is no rebound and no guarding.  Positive for hepatomegaly.  Tender.  Musculoskeletal:        General: No tenderness or edema. Normal range of motion.     Cervical back: Normal range of motion and neck supple.  Neurological: He is alert and oriented to person, place, and time.  Skin: Skin is warm.  Psychiatric: Affect normal.     Labs:  Lab Results  Component Value Date   WBC 7.4 05/31/2019   HGB 11.1 (L) 05/31/2019   HCT 33.0 (L) 05/31/2019   MCV 91.7 05/31/2019   PLT 114 (L) 05/31/2019   NEUTROABS 5.0 05/31/2019    Lab Results  Component Value Date   NA 134 (L) 05/31/2019   K 4.5 05/31/2019   CL 103 05/31/2019   CO2 20  (L) 05/31/2019    Studies:  No results found.  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  #Stage IV adenocarcinoma the esophagus-progression noted on recent scans; awaiting resolution of acute issues pneumonia [see below]-prior to considering starting chemotherapy.  Again understands that chemotherapy Taxol-ram only up to 30% response rates.  #Right lower lobe pneumonia-community-clinically stable; on ceftriaxone/azithromycin.   #Abdominal pain/right flank-/elevated LFTs -secondary to hepatomegaly/malignancy-recommend morphine 2 mg every 6 hours as needed.  Stable  #Anxiety-worse; recommend Ativan 1 mg every 8 hours as needed.  #Overall prognosis/goals of care: Long discussion with the patient and wife that the prognosis is guarded.  Patient's lack of improvement clinically [shortness of breath/dyspepsia/tachycardia/fatigue]-is per the combination of under pneumonia/and underlying progressive malignancy.  Patient understand that it is only possible to offer chemotherapy if patient's pneumonia is improved-clinically infection improved.  He understands that if his liver numbers continue to rise-that could prohibit any further chemotherapy.  I would recommend patient takes time off work during this difficult situation.  Patient is having difficult time adjusting to his current clinical situation given his commitment at work; the fact the patient was working through his metastatic diagnosis for the last many months.  Today patient is  more agreeable to discuss CODE STATUS/advance directives.  Recommend palliative care evaluation.  Cammie Sickle, MD 05/31/2019  9:52 AM

## 2019-05-31 NOTE — Progress Notes (Signed)
   05/31/19 1110  Therapy Vitals  Patient Position (if appropriate) Orthostatic Vitals  Orthostatic Sitting  BP- Sitting 126/74  Pulse- Sitting 129  Orthostatic Standing at 0 minutes  BP- Standing at 0 minutes 90/43 (dizzy, SOB, CP)  Pulse- Standing at 0 minutes 146    Pt has orthostatic intolerance during PT session, presyncope brought on in <60sec standing. Pt educated on limiting standing activity, monitoring symptoms. RN made aware.   12:59 PM, 05/31/19 Etta Grandchild, PT, DPT Physical Therapist - [facility]  [phone] [facility])

## 2019-05-31 NOTE — Telephone Encounter (Signed)
Received incoming fax from [employer] for wife's fmla papers. I contacted pt's wife to clarify her desires for FMLA leave dates and her preference for either intermittent vs continuous leave.  Blanch Media (pt's wife) stated that she would like the begin dates for fmla to start on Monday this week. She prefers intermittent leave. However, given patient's condition at this time, this may need to be changed in the near future to continuous leave.  Wife informed that we will complete the fmla and notify her when the documents have been finished.

## 2019-05-31 NOTE — Evaluation (Addendum)
Physical Therapy Evaluation Patient Details Name: Matthew Yang MRN: [id number] DOB: 06-30-1960 Today's Date: 05/31/2019   History of Present Illness  Matthew Yang is a 28yoM who [facility] on 3/22 c SOB; CT scan revealed findings consistent with right lower lobe pneumonia. PMH: stage IV metastatic esophageal cancer, type diabetes mellitus, hypertension and dyslipidemia.  Clinical Impression  Pt admitted with above diagnosis. Pt currently with functional limitations due to the deficits listed below (see "PT Problem List"). Upon entry, pt in chair, awake and agreeable to participate. Wife at bedside. The pt is alert and oriented x4, pleasant, conversational, and generally a good historian. HR running 130bpm prior to entry, elevates to mid 140s with standing x 60sec (+) correlation with dizziness, chest pain, decreased equilibium, SOB. Pt also has correlation of O2 drop to 84% standing (on 2.5L as received, although this is improved with higher flow rate, as is onset dizziness standing. 60sec standing demonstrates an orthostatic BP drop to 90s/40s, unsafe to attempt a 3 minute BP. Pt has increased instability and weakness of legs, SOB, but is still moving well enough to not need any physical assistance at evaluation, however pt is drastically impaired from his recent baseline for functional mobility. Functional mobility assessment demonstrates increased effort/time requirements, poor tolerance, but no need for physical assistance, whereas the patient performed these at a higher level of independence PTA. Pt will benefit from skilled PT intervention to increase independence and safety with basic mobility in preparation for discharge to the venue listed below.       Follow Up Recommendations Home health PT;Supervision for mobility/OOB    Equipment Recommendations  Rolling walker with 5" wheels    Recommendations for Other Services       Precautions / Restrictions Precautions Precautions:  Fall Precaution Comments: orthostatic intolerance at eval Restrictions Weight Bearing Restrictions: No      Mobility  Bed Mobility               General bed mobility comments: OOB at entry; wife helped pt up to chair earlier.  Transfers Overall transfer level: Needs assistance Equipment used: None Transfers: Sit to/from Stand Sit to Stand: Supervision         General transfer comment: requisite use of hands to rise; tolerates ~60sec x3 with worsening CP, dizziness, BP drop, O2 drop, HR increase from 129-146bpm  Ambulation/Gait Ambulation/Gait assistance: (medically contraindicated)              Stairs            Wheelchair Mobility    Modified Rankin (Stroke Patients Only)       Balance Overall balance assessment: Modified Independent                                           Pertinent Vitals/Pain Pain Assessment: 0-10 Pain Score: 4  Pain Location: upper ABD pain Pain Intervention(s): Limited activity within patient's tolerance;Monitored during session;Premedicated before session    Home Living Family/patient expects to be discharged to:: Private residence Living Arrangements: Spouse/significant other Available Help at Discharge: Family Type of Home: House Home Access: Stairs to enter Entrance Stairs-Rails: Can reach both Entrance Stairs-Number of Steps: 4 Home Layout: One level Home Equipment: None      Prior Function Level of Independence: Independent         Comments: still working FT, typically not having  Hand Dominance        Extremity/Trunk Assessment   Upper Extremity Assessment Upper Extremity Assessment: Overall WFL for tasks assessed;Generalized weakness    Lower Extremity Assessment Lower Extremity Assessment: Generalized weakness;Overall WFL for tasks assessed       Communication      Cognition Arousal/Alertness: Awake/alert Behavior During Therapy: WFL for tasks  assessed/performed Overall Cognitive Status: Within Functional Limits for tasks assessed                                        General Comments      Exercises     Assessment/Plan    PT Assessment Patient needs continued PT services  PT Problem List Decreased activity tolerance;Decreased balance;Decreased mobility;Cardiopulmonary status limiting activity       PT Treatment Interventions DME instruction;Balance training;Gait training;Functional mobility training;Therapeutic activities;Therapeutic exercise;Patient/family education    PT Goals (Current goals can be found in the Care Plan section)  Acute Rehab PT Goals Patient Stated Goal: return to [place] function to return to work PT Goal Formulation: With patient Time For Goal Achievement: 06/14/19 Potential to Achieve Goals: Good    Frequency Min 2X/week   Barriers to discharge Decreased caregiver support wife works during day    Co-evaluation               AM-PAC PT "6 Clicks" Mobility  Outcome Measure Help needed turning from your back to your side while in a flat bed without using bedrails?: None Help needed moving from lying on your back to sitting on the side of a flat bed without using bedrails?: None Help needed moving to and from a bed to a chair (including a wheelchair)?: A Little Help needed standing up from a chair using your arms (e.g., wheelchair or bedside chair)?: A Little Help needed to walk in hospital room?: Total Help needed climbing 3-5 steps with a railing? : Total 6 Click Score: 16    End of Session Equipment Utilized During Treatment: Gait belt Activity Tolerance: Patient tolerated treatment well;No increased pain Patient left: in chair;with family/visitor present;with nursing/sitter in room Nurse Communication: Mobility status PT Visit Diagnosis: Unsteadiness on feet (R26.81);Difficulty in walking, not elsewhere classified (R26.2);History of falling (Z91.81);Dizziness  and giddiness (R42)    Time: [id number] PT Time Calculation (min) (ACUTE ONLY): 32 min   Charges:   PT Evaluation $PT Eval Moderate Complexity: 1 Mod PT Treatments $Self Care/Home Management: 8-22        12:28 PM, 05/31/19 Etta Grandchild, PT, DPT Physical Therapist - [facility]  [phone] ([place])    Charnay Nazario C 05/31/2019, 12:21 PM

## 2019-05-31 NOTE — Progress Notes (Signed)
[place] [facility] [place] NAME: Matthew Yang    MR#:  [id number]  DATE OF BIRTH:  June 10, 1960  SUBJECTIVE:  CHIEF COMPLAINT:   Chief Complaint  Patient presents with  . Shortness of Breath  SOB, tachy REVIEW OF SYSTEMS:  Review of Systems  Constitutional: Positive for malaise/fatigue. Negative for diaphoresis, fever and weight loss.  HENT: Negative.  Negative for ear discharge, ear pain, hearing loss, nosebleeds, sore throat and tinnitus.   Eyes: Negative for blurred vision and pain.  Respiratory: Positive for cough, sputum production and shortness of breath. Negative for hemoptysis and wheezing.   Cardiovascular: Positive for chest pain and palpitations. Negative for orthopnea and leg swelling.  Gastrointestinal: Positive for abdominal pain and constipation. Negative for blood in stool, diarrhea, heartburn, nausea and vomiting.  Genitourinary: Negative for dysuria, frequency and urgency.  Musculoskeletal: Positive for back pain. Negative for myalgias.  Skin: Negative for itching and rash.  Neurological: Negative for dizziness, tingling, tremors, focal weakness, seizures, weakness and headaches.  Psychiatric/Behavioral: Negative for depression. The patient is not nervous/anxious.    DRUG ALLERGIES:  No Known Allergies VITALS:  Blood pressure (!) 148/69, pulse (!) 105, temperature 98.4 F (36.9 C), temperature source Oral, resp. rate (!) 23, height 5\' 10"  (1.778 m), weight 132.9 kg, SpO2 95 %. PHYSICAL EXAMINATION:  Physical Exam HENT:     Head: Normocephalic and atraumatic.  Eyes:     Conjunctiva/sclera: Conjunctivae normal.     Pupils: Pupils are equal, round, and reactive to light.  Neck:     Trachea: No tracheal deviation.  Cardiovascular:     Rate and Rhythm: Normal rate and regular rhythm.     Heart sounds: Normal heart sounds.  Pulmonary:     Effort: Pulmonary effort is normal. No respiratory distress.     Breath sounds: Examination of  the right-lower field reveals decreased breath sounds and rhonchi. Decreased breath sounds and rhonchi present. No wheezing.  Chest:     Chest wall: No tenderness.  Abdominal:     General: Bowel sounds are normal. There is no distension.     Palpations: Abdomen is soft.     Tenderness: There is abdominal tenderness.  Musculoskeletal:        General: Normal range of motion.     Cervical back: Normal range of motion and neck supple.  Skin:    General: Skin is warm and dry.     Findings: No rash.  Neurological:     Mental Status: He is alert and oriented to person, place, and time.     Cranial Nerves: No cranial nerve deficit.    LABORATORY PANEL:  Male CBC Recent Labs  Lab 05/31/19 0524  WBC 7.4  HGB 11.1*  HCT 33.0*  PLT 114*   ------------------------------------------------------------------------------------------------------------------ Chemistries  Recent Labs  Lab 05/31/19 0524  NA 134*  K 4.5  CL 103  CO2 20*  GLUCOSE 109*  BUN 14  CREATININE 0.76  CALCIUM 8.7*  AST 256*  ALT 107*  ALKPHOS 288*  BILITOT 2.4*   RADIOLOGY:  No results found. ASSESSMENT AND PLAN:  Matthew Yang 59 y.o.  male with a history of metastatic esophageal cancer admitted for pneumonia  1.  Right lower lobe community-acquired pneumonia differential diagnosis including streptococcal or atypicals. Treated with IV Rocephin and Zithromax not changing to oral antibiotics. Culture so far negative. Continue oxygen. Continue pain control.  2.  Sepsis, likely secondary to #1.  This manifested by hypotension, tachycardia and tachypnea, present on admission  -Trend lactic acid level -no growth in blood cultures -Hold hydration with IV normal saline.  3.  Mild acute hypoxic respiratory failure secondary to #1. -2 L oxygen via nasal cannula.  Wean as tolerated  4.  Dyslipidemia. - hold off statin therapy given elevated LFTs.  5.  Hypertension. -Continue Zestril.  6.   GERD. -continue PPI therapy.  7.  Metastatic esophageal cancer. -Followed by Dr. Rogue Bussing at cancer center -consult appreciated.  8.  Cancer-related pain. Pain medication adjusted. Palliative care consult for further assistance. Appreciate assistance.   DVT prophylaxis: Lovenox Family Communication: Updated wife at bedside Disposition Plan: Came from home and will be likely going back to home environment once improved Barriers to DC -none, waiting for clinical improvement  All the records are reviewed and case discussed with Care Management/Social Worker. Management plans discussed with the patient, family and they are in agreement.  CODE STATUS: Full Code  TOTAL TIME TAKING CARE OF THIS PATIENT: 35 minutes.   More than 50% of the time was spent in counseling/coordination of care: YES   Berle Mull M.D on 05/31/2019 at 6:58 PM  [facility]   CC: Primary care physician; Kirk Ruths, MD  Note: This dictation was prepared with Dragon dictation along with smaller phrase technology. Any transcriptional errors that result from this process are unintentional.

## 2019-06-01 ENCOUNTER — Other Ambulatory Visit: Payer: Self-pay

## 2019-06-01 ENCOUNTER — Inpatient Hospital Stay: Payer: No Typology Code available for payment source

## 2019-06-01 DIAGNOSIS — J9601 Acute respiratory failure with hypoxia: Secondary | ICD-10-CM

## 2019-06-01 DIAGNOSIS — J9602 Acute respiratory failure with hypercapnia: Secondary | ICD-10-CM

## 2019-06-01 DIAGNOSIS — E872 Acidosis: Secondary | ICD-10-CM

## 2019-06-01 DIAGNOSIS — J9600 Acute respiratory failure, unspecified whether with hypoxia or hypercapnia: Secondary | ICD-10-CM

## 2019-06-01 DIAGNOSIS — C787 Secondary malignant neoplasm of liver and intrahepatic bile duct: Secondary | ICD-10-CM

## 2019-06-01 LAB — BLOOD GAS, ARTERIAL
Acid-base deficit: 4.4 mmol/L — ABNORMAL HIGH (ref 0.0–2.0)
Bicarbonate: 18.3 mmol/L — ABNORMAL LOW (ref 20.0–28.0)
FIO2: 0.38
O2 Saturation: 97 %
Patient temperature: 37
pCO2 arterial: 27 mmHg — ABNORMAL LOW (ref 32.0–48.0)
pH, Arterial: 7.44 (ref 7.350–7.450)
pO2, Arterial: 87 mmHg (ref 83.0–108.0)

## 2019-06-01 LAB — CBC WITH DIFFERENTIAL/PLATELET
Abs Immature Granulocytes: 0.2 10*3/uL — ABNORMAL HIGH (ref 0.00–0.07)
Basophils Absolute: 0 10*3/uL (ref 0.0–0.1)
Basophils Relative: 0 %
Eosinophils Absolute: 0.1 10*3/uL (ref 0.0–0.5)
Eosinophils Relative: 1 %
HCT: 34.7 % — ABNORMAL LOW (ref 39.0–52.0)
Hemoglobin: 11.8 g/dL — ABNORMAL LOW (ref 13.0–17.0)
Immature Granulocytes: 2 %
Lymphocytes Relative: 11 %
Lymphs Abs: 1 10*3/uL (ref 0.7–4.0)
MCH: 31.3 pg (ref 26.0–34.0)
MCHC: 34 g/dL (ref 30.0–36.0)
MCV: 92 fL (ref 80.0–100.0)
Monocytes Absolute: 1.1 10*3/uL — ABNORMAL HIGH (ref 0.1–1.0)
Monocytes Relative: 12 %
Neutro Abs: 6.7 10*3/uL (ref 1.7–7.7)
Neutrophils Relative %: 74 %
Platelets: 128 10*3/uL — ABNORMAL LOW (ref 150–400)
RBC: 3.77 MIL/uL — ABNORMAL LOW (ref 4.22–5.81)
RDW: 14.3 % (ref 11.5–15.5)
WBC: 9.2 10*3/uL (ref 4.0–10.5)
nRBC: 0.2 % (ref 0.0–0.2)

## 2019-06-01 LAB — COMPREHENSIVE METABOLIC PANEL
ALT: 131 U/L — ABNORMAL HIGH (ref 0–44)
AST: 349 U/L — ABNORMAL HIGH (ref 15–41)
Albumin: 2.7 g/dL — ABNORMAL LOW (ref 3.5–5.0)
Alkaline Phosphatase: 299 U/L — ABNORMAL HIGH (ref 38–126)
Anion gap: 16 — ABNORMAL HIGH (ref 5–15)
BUN: 16 mg/dL (ref 6–20)
CO2: 18 mmol/L — ABNORMAL LOW (ref 22–32)
Calcium: 8.8 mg/dL — ABNORMAL LOW (ref 8.9–10.3)
Chloride: 99 mmol/L (ref 98–111)
Creatinine, Ser: 0.77 mg/dL (ref 0.61–1.24)
GFR calc Af Amer: >60 mL/min (ref >60)
GFR calc non Af Amer: >60 mL/min (ref >60)
Glucose, Bld: 127 mg/dL — ABNORMAL HIGH (ref 70–99)
Potassium: 4.5 mmol/L (ref 3.5–5.1)
Sodium: 133 mmol/L — ABNORMAL LOW (ref 135–145)
Total Bilirubin: 4 mg/dL — ABNORMAL HIGH (ref 0.3–1.2)
Total Protein: 6.3 g/dL — ABNORMAL LOW (ref 6.5–8.1)

## 2019-06-01 LAB — LACTIC ACID, PLASMA
Lactic Acid, Venous: 6.8 mmol/L (ref 0.5–1.9)
Lactic Acid, Venous: 7.6 mmol/L (ref 0.5–1.9)

## 2019-06-01 LAB — PROTIME-INR
INR: 1.3 — ABNORMAL HIGH (ref 0.8–1.2)
Prothrombin Time: 16.4 seconds — ABNORMAL HIGH (ref 11.4–15.2)

## 2019-06-01 LAB — TROPONIN I (HIGH SENSITIVITY)
Troponin I (High Sensitivity): 33 ng/L — ABNORMAL HIGH (ref <18)
Troponin I (High Sensitivity): 37 ng/L — ABNORMAL HIGH (ref <18)

## 2019-06-01 LAB — GLUCOSE, CAPILLARY: Glucose-Capillary: 159 mg/dL — ABNORMAL HIGH (ref 70–99)

## 2019-06-01 LAB — CK: Total CK: 311 U/L (ref 49–397)

## 2019-06-01 LAB — MRSA PCR SCREENING: MRSA by PCR: NEGATIVE

## 2019-06-01 MED ORDER — CHLORHEXIDINE GLUCONATE CLOTH 2 % EX PADS
6.0000 | MEDICATED_PAD | Freq: Every day | CUTANEOUS | Status: DC
  Administered 2019-06-01: 6 via TOPICAL

## 2019-06-01 MED ORDER — GUAIFENESIN-CODEINE 100-10 MG/5ML PO SOLN: 5.0000 mL | Freq: Four times a day (QID) | ORAL | Status: DC | PRN

## 2019-06-01 MED ORDER — FUROSEMIDE 10 MG/ML IJ SOLN
40.0000 mg | Freq: Once | INTRAMUSCULAR | Status: AC
  Administered 2019-06-01: 40 mg via INTRAVENOUS
  Filled 2019-06-01: qty 4

## 2019-06-01 MED ORDER — MORPHINE SULFATE ER 15 MG PO TBCR
30.0000 mg | EXTENDED_RELEASE_TABLET | Freq: Two times a day (BID) | ORAL | Status: DC
  Administered 2019-06-01: 30 mg via ORAL
  Filled 2019-06-01: qty 1

## 2019-06-01 MED ORDER — DEXMEDETOMIDINE HCL IN NACL 400 MCG/100ML IV SOLN
0.4000 ug/kg/h | INTRAVENOUS | Status: DC
  Administered 2019-06-01 – 2019-06-02 (×2): 0.4 ug/kg/h via INTRAVENOUS
  Filled 2019-06-01 (×2): qty 100

## 2019-06-01 MED ORDER — PIPERACILLIN-TAZOBACTAM 3.375 G IVPB
3.3750 g | Freq: Three times a day (TID) | INTRAVENOUS | Status: DC
  Filled 2019-06-01 (×3): qty 50

## 2019-06-01 MED ORDER — GLYCOPYRROLATE 0.2 MG/ML IJ SOLN: 0.2000 mg | Freq: Four times a day (QID) | INTRAMUSCULAR | Status: DC | PRN

## 2019-06-01 MED ORDER — METHYLPREDNISOLONE SODIUM SUCC 125 MG IJ SOLR
60.0000 mg | Freq: Two times a day (BID) | INTRAMUSCULAR | Status: DC
  Administered 2019-06-01: 60 mg via INTRAVENOUS
  Filled 2019-06-01: qty 2

## 2019-06-01 MED ORDER — LEVALBUTEROL HCL 0.63 MG/3ML IN NEBU
0.6300 mg | INHALATION_SOLUTION | Freq: Four times a day (QID) | RESPIRATORY_TRACT | Status: DC
  Administered 2019-06-01: 08:00:00 0.63 mg via RESPIRATORY_TRACT
  Filled 2019-06-01 (×5): qty 3

## 2019-06-01 MED ORDER — METHYLPREDNISOLONE SODIUM SUCC 40 MG IJ SOLR
40.0000 mg | Freq: Two times a day (BID) | INTRAMUSCULAR | Status: DC
  Administered 2019-06-01: 23:00:00 40 mg via INTRAVENOUS
  Filled 2019-06-01: qty 1

## 2019-06-01 MED ORDER — MORPHINE SULFATE 15 MG PO TABS
15.0000 mg | ORAL_TABLET | ORAL | Status: DC | PRN
  Administered 2019-06-01: 18:00:00 15 mg via ORAL
  Filled 2019-06-01: qty 1

## 2019-06-01 MED ORDER — IPRATROPIUM BROMIDE 0.02 % IN SOLN
0.5000 mg | Freq: Four times a day (QID) | RESPIRATORY_TRACT | Status: DC
  Administered 2019-06-01: 08:00:00 0.5 mg via RESPIRATORY_TRACT
  Filled 2019-06-01 (×3): qty 2.5

## 2019-06-01 NOTE — Progress Notes (Signed)
11:30am - Ch visited pt. as follow-up from yesterday's visit and b/c pt. transferred to ICU from 1C.  [place] learned from medical team that pt.'s cancer has spread significantly --prognosis appears poor.  Doctor discussed this reality w/pt. and wife Blanch Media; [facility] entered shortly after this conversation.  Wife tearful but seemed to understand situation --had begun calling family to come to hospital.  Pt. seemed to be struggling to fully process news re: his cancer; pt. on bipap but talking w/CH and wife.  Wife said she would like to have pt.'s siblings and children and pastor come to hospital.  [facility] will continue to follow up to assess pt.'s needs.  3:15 - CH revisited pt.  Two sisters @ bedside; pt. no longer on bipap --nasal canula now.  Pt. said he'd talked to his pastor and had become a [place].  Sisters said he was raised as a [place] but has 'walked away' from the faith.  CH offered to bring Bible.  [place] advises continued support of pt. and family as they process pt.'s new diagnosis.    06/01/19 1515  Clinical Encounter Type  Visited With Patient and family together;Health care provider  Visit Type Follow-up;Psychological support;Spiritual support;Social support;Critical Care  Referral From Physician;Nurse;Other (Comment) (Routine Rounding)  Consult/Referral To Chaplain  Spiritual Encounters  Spiritual Needs Emotional;Grief support  Stress Factors  Patient Stress Factors Health changes;Lack of knowledge;Major life changes  Family Stress Factors Major life changes;Loss of control;Lack of knowledge;Health changes

## 2019-06-01 NOTE — Progress Notes (Signed)
Pt transferred to [facility] related to SOB,  Desaturation with increased O2 requirement. Pt also had high lactid level that was reported to Dr.Patel within 30 minutes of reported value. Report was called and given to [employer] in stepdown. Pt's wife was at bedside during entire event and was kept informed

## 2019-06-01 NOTE — Progress Notes (Signed)
Earlier today before patient was transferred to CCU. Manuela Schwartz with the Lab called with a critical Lactic Acid of 6.8. Gave results to patients nurse Marthann Schiller, RN.

## 2019-06-01 NOTE — Progress Notes (Signed)
[facility] Progress Note  Patient: Matthew Yang    [id number]  DOA: 05/28/2019     Date of Service: the patient was seen and examined on 06/01/2019  Chief Complaint  Patient presents with  . Shortness of Breath   Brief hospital course: Patient with past medical history of stage IV metastatic esophageal cancer, type II DM, HTN, HLD.  Patient presented with complaints of shortness of breath.  Found to have community-acquired pneumonia.  Treated with IV antibiotics blood cultures were negative but asymptomatic logically patient continues to have shortness of breath as well as worsening hypoxia.  Transfer to stepdown for supportive care. Currently further plan is continue current care as well as goals of care discussion.  Assessment and Plan: 1.  Acute hypoxic respiratory failure. History of OSA. Community-acquired pneumonia. Currently on 5 L of oxygen. ABG shows evidence of hyperventilation with respiratory alkalosis. Patient does have significant work of breathing likely from his pain and anxiety. Continue oxygen continue BiPAP and CPAP. Patient remains at high risk for intubation. Appreciate PCCM input.  2.  Stage IV esophageal cancer. Liver metastasis. Cancer-related pain Likely the primary driver for patient's current ongoing symptoms and lack of improvement. CT PE protocol at the time of the admission was negative for pulmonary embolism. CT abdomen shows evidence of progress of his cancer. Patient does have significant abdominal pain. We will add scheduled pain medication as well as encourage patient to use as needed medication. We will monitor response. Palliative care consulted for goals of care discussion appreciate assistance currently DNR/DNI.  3.  Sinus tachycardia Likely in response to patient's critical illness. Monitor.  4.  Lactic acidosis Likely coming from liver injury from his ongoing cancer. Could also consider with his work of breathing. Currently  the stepdown unit for close observation. IV fluids may not have been actually make his respiratory illness worse.  5.  Community-acquired pneumonia Blood cultures are negative. Work-up otherwise is also negative. Chest x-ray performed on 06/01/2019 does not show any evidence of new pneumonia. Continuing with oral antibiotics for now. Continue nebulizer as well as oral steroids.  6.  Transaminitis. Secondary to liver metastasis. Progressively worsening. Likely driving patient's current presentation. Bilirubin is also worsening. Can also contribute to anticoagulopathy down the road.  Monitor.  7.  Morbid obesity. OSA. Continue CPAP. Body mass index is 42.04 kg/m.   Diet: Cardiac diet DVT Prophylaxis: Subcutaneous Lovenox   Advance goals of care discussion: DNR Respiratory failure secondary to Family Communication: family was present at bedside, at the time of interview.  The pt provided permission to discuss medical plan with the family. Opportunity was given to ask question and all questions were answered satisfactorily.   Disposition:  Pt is from home, admitted with COPD and respiratory failure, still has ongoing respiratory failure, which precludes a safe discharge. Discharge to be determined.  Subjective: Continues to report shortness of breath continues to report uncontrolled pain no nausea no vomiting.  Passing gas.  Had a bowel movement.  Physical Exam: General:  alert oriented to time, place, and person.  Appear in marked distress, affect appropriate Eyes: PERRL ENT: Oral Mucosa Clear, moist  Neck: difficult to assess  JVD,  Cardiovascular: S1 and S2 Present, no Murmur,  Respiratory: increased respiratory effort, Bilateral Air entry equal and Decreased, bilateral  Crackles, no wheezes Abdomen: Bowel Sound present, Soft and no tenderness,  Skin: no rash Extremities: no Pedal edema, no calf tenderness Neurologic: without any new focal findings Gait not checked due  to patient safety concerns  Vitals:   05/31/19 2255 06/01/19 0631 06/01/19 0638 06/01/19 0747  BP:  125/63    Pulse:  (!) 120    Resp: 20 (!) 23    Temp:  98.1 F (36.7 C)    TempSrc:  Oral    SpO2:   91% 90%  Weight:      Height:        Intake/Output Summary (Last 24 hours) at 06/01/2019 0841 Last data filed at 06/01/2019 0800 Gross per 24 hour  Intake 720 ml  Output 600 ml  Net 120 ml   Filed Weights   05/28/19 1612 05/29/19 1546  Weight: 132.9 kg 132.9 kg    Data Reviewed: I have personally reviewed and interpreted daily labs, tele strips, imagings as discussed above. I reviewed all nursing notes, pharmacy notes, vitals, pertinent old records I have discussed plan of care as described above with RN and patient/family.  CBC: Recent Labs  Lab 05/28/19 1621 05/29/19 0758 05/30/19 0503 05/31/19 0524 06/01/19 0828  WBC 8.3 6.1 6.7 7.4 9.2  NEUTROABS 5.7  --   --  5.0 6.7  HGB 13.0 11.1* 11.0* 11.1* 11.8*  HCT 38.6* 33.0* 32.8* 33.0* 34.7*  MCV 91.3 92.2 91.9 91.7 92.0  PLT 143* 114* 110* 114* 0000000*   Basic Metabolic Panel: Recent Labs  Lab 05/28/19 1621 05/29/19 0758 05/30/19 0503 05/31/19 0524  NA 137 137 136 134*  K 4.8 4.5 4.4 4.5  CL 102 106 104 103  CO2 22 21* 20* 20*  GLUCOSE 120* 106* 115* 109*  BUN 19 15 14 14   CREATININE 1.06 0.81 0.81 0.76  CALCIUM 9.4 8.6* 8.6* 8.7*    Studies: DG Chest 1 View  Result Date: 06/01/2019 CLINICAL DATA:  Hypoxia EXAM: CHEST  1 VIEW COMPARISON:  Four days ago FINDINGS: Porta catheter with tip at the SVC. Interstitial prominence similar to prior. Airway thickening and mild pneumonia seen by recent chest CT. Stable heart size and mediastinal contours; there is thoracic adenopathy by recent chest CT. No effusion or pneumothorax. IMPRESSION: Airway thickening and right lower lobe pneumonia by recent chest CT. No radiographic change since prior. Electronically Signed   By: Monte Fantasia M.D.   On: 06/01/2019 06:56      Scheduled Meds: . azithromycin  500 mg Oral Daily  . benzonatate  100 mg Oral TID  . cefdinir  300 mg Oral Q12H  . Chlorhexidine Gluconate Cloth  6 each Topical Daily  . chlorpheniramine-HYDROcodone  5 mL Oral Q12H  . enoxaparin (LOVENOX) injection  40 mg Subcutaneous BID  . guaiFENesin  600 mg Oral BID  . ipratropium  0.5 mg Nebulization Q6H  . levalbuterol  0.63 mg Nebulization Q6H  . lisinopril  20 mg Oral Daily  . multivitamin with minerals  1 tablet Oral Daily  . omega-3 acid ethyl esters  1 g Oral Daily  . pantoprazole  40 mg Oral Daily  . polyethylene glycol  17 g Oral Daily  . senna-docusate  1 tablet Oral BID  . simethicone  80 mg Oral QID   Continuous Infusions: . sodium chloride Stopped (05/30/19 0120)   PRN Meds: sodium chloride, acetaminophen **OR** acetaminophen, alum & mag hydroxide-simeth, LORazepam, magnesium hydroxide, morphine injection, ondansetron **OR** ondansetron (ZOFRAN) IV, oxyCODONE, traZODone  Time spent: 35 minutes  Author: Berle Mull, MD [facility] 06/01/2019 8:41 AM  To reach On-call, see care teams to locate the attending and reach out to them via [email or web address]. If 7PM-7AM,  please contact night-coverage If you still have difficulty reaching the attending provider, please page the [facility] (Director on Call) for [facility] on amion for assistance.

## 2019-06-01 NOTE — Progress Notes (Signed)
Called by RN patient with heart rate 130-'s and short of breath with abdominal pain. Review of history and current plan Bedside patient does state he feels more short of breath than normal. Crackles significant right lower lobe. Increased oxygen requirement with change from CPAP to nasal cannula oxygen.   Chest x-ray pending Lasix 40 mg ordered as patient with likely fluid overload

## 2019-06-01 NOTE — Progress Notes (Addendum)
Pts wife Jarl Sagal requested to discuss goals of treatment and possible pt discharge home with hospice on 06/02/2019.  Following discussion with pts wife, daughter, and 2 sons will focus on comfort.  Therefore, will discontinue all medications not focused on comfort and labs.  If pt appears stable for transport home via EMS pts family would like pt discharged home with hospice.  However, if pt unable to transport via EMS safely, pts family would like to transition the patient to full comfort care measures in the hospital.  Acknowledged all request and updated pts RN regarding pts family request.  Will continue to monitor and assess pt.  Marda Stalker, [place] Pager [phone] (please enter 7 digits) PCCM Consult Pager [phone] (please enter 7 digits)

## 2019-06-01 NOTE — Progress Notes (Signed)
Pt having increased sob, and elevated hr in the 130's. Notified Sharion Settler, NP orders entered for stat cxr, lasix 40 mg iv. Pt also administered morphine 2mg  iv for abdominal pain. Will continue to monitor and support the pt.

## 2019-06-01 NOTE — Telephone Encounter (Signed)
FMLA forms completed. Pending md signature

## 2019-06-01 NOTE — Progress Notes (Signed)
[place]  [phone] [phone]   Name: Matthew Yang Date: 06/01/2019 MRN: [id number]  DOB: March 03, 1961  Patient Care Team: Kirk Ruths, MD as PCP - General (Internal Medicine) Lucky Cowboy, Erskine Squibb, MD as Consulting Physician (Vascular Surgery) Clent Jacks, RN as Registered Nurse Alfonzo Feller, RN as [place] Management    REASON FOR CONSULTATION: Dao Memmott is a 59 y.o. 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  Patient was admitted to the hospital on 05/28/2019 with pneumonia after having 4 to 5 days of progressive dyspnea.  Hospitalization has been complicated by persistent exertional dyspnea/hypoxia.  Palliative care was consulted help address goals and manage ongoing symptoms.  CODE STATUS: DNR  PAST MEDICAL HISTORY: Past Medical History:  Diagnosis Date  . Diabetes mellitus without complication ([place])   . Esophagus cancer ([place])   . Hepatitis    HEPATITIS C  . Hypercholesteremia   . Hypertension   . Morbid obesity ([place])   . Positive hepatitis C antibody test   . Sleep apnea   . Tubular adenoma of colon    polyp    PAST SURGICAL HISTORY:  Past Surgical History:  Procedure Laterality Date  . CHOLECYSTECTOMY    . COLONOSCOPY    . COLONOSCOPY WITH PROPOFOL N/A 05/04/2016   Procedure: COLONOSCOPY WITH PROPOFOL;  Surgeon: Lollie Sails, MD;  Location: [facility] ENDOSCOPY;  Service: Endoscopy;  Laterality: N/A;  . ESOPHAGOGASTRODUODENOSCOPY N/A 05/03/2019   Procedure: ESOPHAGOGASTRODUODENOSCOPY (EGD);  Surgeon: Toledo, Benay Pike, MD;  Location: [facility] ENDOSCOPY;  Service: Gastroenterology;  Laterality: N/A;  . ESOPHAGOGASTRODUODENOSCOPY (EGD) WITH PROPOFOL N/A 08/04/2018   Procedure: ESOPHAGOGASTRODUODENOSCOPY (EGD) WITH PROPOFOL;  Surgeon: Lollie Sails, MD;  Location: [facility] ENDOSCOPY;   Service: Endoscopy;  Laterality: N/A;  . 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  # June rd week- 5FU pump every 2 days; +RT [until aug 4th]  # 10/16/2018-FOLFOX #1; 9/21-PET- PR. 11/28/2018- cycle #6-decreased the ox dose to 60 mg/2 [sec to platelets- 94]. S/p 12 cycles of FOLFOX; DEC 15th 2020- switched to 5 CIV ONLY; dced Oxaliplatin.   # MARCH 15th 2021-progression of disease-liver mets bone mets/thoracic abdominal retroperitoneal lymph nodes  # MARCH 23,2021- TAX+RAM   # MARCH 2021- Bone mets- Zometa  # DM- diet controlled; Hepatitis C; September 2020-right kidney stone/spontaneous; foreign body impaction/spontaneous  # EGD-[Dr.Toledo] FEB 2021-complete response to tumor  # NGS/omniseq- PDL-1 CPS 20%;EGFR-copy number gain; NEG- targets/ Her-2 neu.   # PALLIATIVE CARE: P  DIAGNOSIS: Adenocarcinoma esophagus/GE junction  STAGE: IV     ;GOALS: pallaitive  CURRENT/MOST RECENT THERAPY: TAX+RAM [C]      Malignant neoplasm of lower third of esophagus ([place])  08/30/2018 - 05/21/2019 Chemotherapy   The  patient had palonosetron (ALOXI) injection 0.25 mg, 0.25 mg, Intravenous,  Once, 9 of 9 cycles Administration: 0.25 mg  (10/30/2018), 0.25 mg (10/16/2018), 0.25 mg (11/28/2018), 0.25 mg (12/12/2018), 0.25 mg (11/14/2018), 0.25 mg (12/26/2018), 0.25 mg (01/09/2019), 0.25 mg (01/23/2019), 0.25 mg (02/06/2019) leucovorin 1,072 mg in dextrose 5 % 250 mL infusion, 400 mg/m2 = 1,072 mg, Intravenous,  Once, 11 of 11 cycles Administration: 1,000 mg (10/30/2018), 1,000 mg (10/16/2018), 1,000 mg (11/28/2018), 1,000 mg (12/12/2018), 1,000 mg (12/26/2018), 1,000 mg (01/09/2019), 1,000 mg (01/23/2019), 1,000 mg (02/06/2019) oxaliplatin (ELOXATIN) 230 mg in dextrose 5 % 500 mL chemo infusion, 85 mg/m2 = 230 mg, Intravenous,  Once, 9 of 9 cycles Dose modification: 60 mg/m2 (original dose 85 mg/m2, Cycle 7, Reason: Provider Judgment) Administration: 230 mg (10/30/2018), 230 mg (10/16/2018), 160 mg (11/28/2018), 160 mg (12/12/2018), 230 mg (11/14/2018), 160 mg (12/26/2018), 160 mg (01/09/2019), 160 mg (01/23/2019), 160 mg (02/06/2019) leucovorin injection 54 mg, 20 mg/m2 = 54 mg (100 % of original dose 20 mg/m2), Intravenous,  Once, 8 of 8 cycles Dose modification: 20 mg/m2 (original dose 20 mg/m2, Cycle 2, Reason: Other (see comments), Comment: iv push), 20 mg/m2 (original dose 20 mg/m2, Cycle 3, Reason: Other (see comments), Comment: ivp) Administration: 54 mg (09/13/2018), 54 mg (10/02/2018) fluorouracil (ADRUCIL) 6,450 mg in sodium chloride 0.9 % 121 mL chemo infusion, 2,400 mg/m2 = 6,450 mg, Intravenous, 1 Day/Dose, 18 of 20 cycles Administration: 6,450 mg (08/30/2018), 6,450 mg (09/13/2018), 6,450 mg (10/30/2018), 6,450 mg (10/02/2018), 6,450 mg (10/16/2018), 6,450 mg (11/28/2018), 6,450 mg (12/12/2018), 6,450 mg (11/14/2018), 6,450 mg (12/26/2018), 6,450 mg (01/09/2019), 6,450 mg (01/23/2019), 6,450 mg (02/06/2019), 6,450 mg (02/20/2019), 6,450 mg (03/06/2019), 6,450 mg (03/20/2019), 6,450 mg (04/03/2019), 6,450 mg (04/17/2019), 6,450 mg (05/08/2019)  for chemotherapy treatment.    05/30/2019 -  Chemotherapy   The patient had PACLitaxel (TAXOL) 204 mg in sodium chloride  0.9 % 250 mL chemo infusion (</= 68m/m2), 80 mg/m2, Intravenous,  Once, 0 of 6 cycles ramucirumab (CYRAMZA) 1,100 mg in sodium chloride 0.9 % 140 mL chemo infusion, 8 mg/kg, Intravenous, Once, 0 of 6 cycles  for chemotherapy treatment.      ALLERGIES:  has No Known Allergies.  MEDICATIONS:  Current Facility-Administered Medications  Medication Dose Route Frequency Provider Last Rate Last Admin  . 0.9 %  sodium chloride infusion   Intravenous PRN OLang Snow NP   Stopped at 05/30/19 0120  . acetaminophen (TYLENOL) tablet 650 mg  650 mg Oral Q6H PRN Mansy, Jan A, MD       Or  . acetaminophen (TYLENOL) suppository 650 mg  650 mg Rectal Q6H PRN Mansy, Jan A, MD      . alum & mag hydroxide-simeth (MAALOX/MYLANTA) 200-200-20 MG/5ML suspension 15 mL  15 mL Oral Q3H PRN PLavina Hamman MD   15 mL at 05/30/19 2107  . azithromycin (ZITHROMAX) tablet 500 mg  500 mg Oral Daily PLavina Hamman MD   500 mg at 06/01/19 1001  . benzonatate (TESSALON) capsule 100 mg  100 mg Oral TID PLavina Hamman MD   100 mg at 06/01/19 1001  . cefdinir (OMNICEF) capsule 300 mg  300 mg Oral Q12H PLavina Hamman MD   300 mg at 06/01/19 1001  . Chlorhexidine Gluconate Cloth 2 % PADS 6 each  6 each Topical Daily PLavina Hamman MD   6 each at 06/01/19 1002  . chlorpheniramine-HYDROcodone (TUSSIONEX) 10-8 MG/5ML suspension 5 mL  5 mL Oral Q12H PLavina Hamman MD  5 mL at 06/01/19 1000  . enoxaparin (LOVENOX) injection 40 mg  40 mg Subcutaneous BID Mansy, Jan A, MD   40 mg at 06/01/19 1003  . guaiFENesin (MUCINEX) 12 hr tablet 600 mg  600 mg Oral BID Mansy, Jan A, MD   600 mg at 06/01/19 1003  . ipratropium (ATROVENT) nebulizer solution 0.5 mg  0.5 mg Nebulization Q6H Lavina Hamman, MD   0.5 mg at 06/01/19 0743  . levalbuterol (XOPENEX) nebulizer solution 0.63 mg  0.63 mg Nebulization Q6H Lavina Hamman, MD   0.63 mg at 06/01/19 0743  . LORazepam (ATIVAN) injection 1 mg  1 mg Intravenous Q8H PRN  Cammie Sickle, MD   1 mg at 06/01/19 1037  . magnesium hydroxide (MILK OF MAGNESIA) suspension 30 mL  30 mL Oral Daily PRN Mansy, Jan A, MD      . methylPREDNISolone sodium succinate (SOLU-MEDROL) 40 mg/mL injection 40 mg  40 mg Intravenous Q12H Kasa, Maretta Bees, MD      . morphine (MS CONTIN) 12 hr tablet 30 mg  30 mg Oral Q12H Lavina Hamman, MD   30 mg at 06/01/19 1001  . morphine (MSIR) tablet 15 mg  15 mg Oral Q4H PRN Lavina Hamman, MD      . morphine 2 MG/ML injection 2-4 mg  2-4 mg Intravenous Q4H PRN Lavina Hamman, MD   2 mg at 06/01/19 1014  . multivitamin with minerals tablet 1 tablet  1 tablet Oral Daily Mansy, Jan A, MD   1 tablet at 06/01/19 1001  . omega-3 acid ethyl esters (LOVAZA) capsule 1 g  1 g Oral Daily Mansy, Jan A, MD   1 g at 06/01/19 1000  . ondansetron (ZOFRAN) tablet 4 mg  4 mg Oral Q6H PRN Mansy, Jan A, MD   4 mg at 05/30/19 2104   Or  . ondansetron [facility]) injection 4 mg  4 mg Intravenous Q6H PRN Mansy, Jan A, MD   4 mg at 06/01/19 0647  . pantoprazole (PROTONIX) EC tablet 40 mg  40 mg Oral Daily Mansy, Jan A, MD   40 mg at 06/01/19 1001  . polyethylene glycol (MIRALAX / GLYCOLAX) packet 17 g  17 g Oral Daily Lavina Hamman, MD   17 g at 06/01/19 1004  . senna-docusate (Senokot-S) tablet 1 tablet  1 tablet Oral BID Lavina Hamman, MD   1 tablet at 06/01/19 1001  . simethicone (MYLICON) chewable tablet 80 mg  80 mg Oral QID Lavina Hamman, MD   80 mg at 06/01/19 1001  . traZODone (DESYREL) tablet 25 mg  25 mg Oral QHS PRN Mansy, Jan A, MD   25 mg at 05/30/19 2105    VITAL SIGNS: BP (!) 108/56   Pulse (!) 120   Temp 98.4 F (36.9 C) (Oral)   Resp (!) 32   Ht _0  (1.778 m)   Wt 294 lb 5 oz (133.5 kg)   SpO2 90%   BMI 42.23 kg/m  Filed Weights   05/28/19 1612 05/29/19 1546 06/01/19 1055  Weight: 293 lb (132.9 kg) 293 lb (132.9 kg) 294 lb 5 oz (133.5 kg)    Estimated body mass index is 42.23 kg/m as calculated from the following:   Height as  of this encounter: _1  (1.778 m).   Weight as of this encounter: 294 lb 5 oz (133.5 kg).  LABS: CBC:    Component Value Date/Time   WBC 9.2 06/01/2019 0828  HGB 11.8 (L) 06/01/2019 0828   HCT 34.7 (L) 06/01/2019 0828   PLT 128 (L) 06/01/2019 0828   MCV 92.0 06/01/2019 0828   NEUTROABS 6.7 06/01/2019 0828   LYMPHSABS 1.0 06/01/2019 0828   MONOABS 1.1 (H) 06/01/2019 0828   EOSABS 0.1 06/01/2019 0828   BASOSABS 0.0 06/01/2019 0828   Comprehensive Metabolic Panel:    Component Value Date/Time   NA 133 (L) 06/01/2019 0828   K 4.5 06/01/2019 0828   CL 99 06/01/2019 0828   CO2 18 (L) 06/01/2019 0828   BUN 16 06/01/2019 0828   CREATININE 0.77 06/01/2019 0828   GLUCOSE 127 (H) 06/01/2019 0828   CALCIUM 8.8 (L) 06/01/2019 0828   AST 349 (H) 06/01/2019 0828   ALT 131 (H) 06/01/2019 0828   ALKPHOS 299 (H) 06/01/2019 0828   BILITOT 4.0 (H) 06/01/2019 0828   BILITOT 0.8 11/12/2014 1317   PROT 6.3 (L) 06/01/2019 0828   PROT 7.3 11/12/2014 1317   ALBUMIN 2.7 (L) 06/01/2019 0828   ALBUMIN 4.5 11/12/2014 1317    RADIOGRAPHIC STUDIES: DG Chest 1 View  Result Date: 06/01/2019 CLINICAL DATA:  Hypoxia EXAM: CHEST  1 VIEW COMPARISON:  Four days ago FINDINGS: Porta catheter with tip at the SVC. Interstitial prominence similar to prior. Airway thickening and mild pneumonia seen by recent chest CT. Stable heart size and mediastinal contours; there is thoracic adenopathy by recent chest CT. No effusion or pneumothorax. IMPRESSION: Airway thickening and right lower lobe pneumonia by recent chest CT. No radiographic change since prior. Electronically Signed   By: Monte Fantasia M.D.   On: 06/01/2019 06:56   CT Angio Chest PE W/Cm &/Or Wo Cm  Result Date: 05/28/2019 CLINICAL DATA:  59 year old male with shortness of breath and generalized pain. History of esophageal cancer. EXAM: CT ANGIOGRAPHY CHEST CT ABDOMEN AND PELVIS WITH CONTRAST TECHNIQUE: Multidetector CT imaging of the chest was  performed using the standard protocol during bolus administration of intravenous contrast. Multiplanar CT image reconstructions and MIPs were obtained to evaluate the vascular anatomy. Multidetector CT imaging of the abdomen and pelvis was performed using the standard protocol during bolus administration of intravenous contrast. CONTRAST:  47m OMNIPAQUE IOHEXOL 350 MG/ML SOLN COMPARISON:  CT of the chest abdomen pelvis dated 02/19/2019. PET CT dated 05/21/2019. FINDINGS: CTA CHEST FINDINGS Cardiovascular: There is no cardiomegaly or pericardial effusion. Advanced 3 vessel coronary vascular calcification. There is mild atherosclerotic calcification of the thoracic aorta. No aneurysmal dilatation or dissection. Partially visualized Port-A-Cath with tip in the central SVC close to the cavoatrial junction. Evaluation of the pulmonary arteries is very limited due to respiratory motion artifact and suboptimal opacification and timing of the contrast. No large or central pulmonary artery embolus identified. Mediastinum/Nodes: Bilateral hilar and mediastinal adenopathy new since the CT of 02/19/2019. Right hilar lymph nodes measure up to 2.3 cm in short axis. Subcarinal adenopathy measures 2.5 cm in short axis. The esophagus is grossly unremarkable but suboptimally visualized due to mediastinal adenopathy. No mediastinal fluid collection. Lungs/Pleura: Faint area of opacity at the right lung base similar to the PET-CT of 05/21/2019 most consistent with pneumonia. Clinical correlation and follow-up recommended. There is diffuse interstitial and interlobular septal thickening of the right lung with mild nodularity most likely infectious or inflammatory in etiology. There is probably mild associated or reactive interstitial edema in the right lung. The left lung is clear. There is no pleural effusion or pneumothorax. The central airways are patent. Musculoskeletal: Partially visualized right supraclavicular lymph node  measures approximately 14 mm (series 4, image 1). Mildly rounded lymph node in the posterior neck deep to the right thyroid lobe (series 4, image 4) measures approximately 12 mm. No definite axillary adenopathy. Mild degenerative changes of the spine. No acute osseous pathology. Review of the MIP images confirms the above findings. CT ABDOMEN and PELVIS FINDINGS No intra-abdominal free air. Small ascites. Hepatobiliary: Cirrhosis. Innumerable hepatic hypodense lesions, new since the CT of 02/19/2019 consistent with metastatic disease. The largest lesion or confluent of lesions in the right lobe of the liver measures approximately 6.7 x 7.7 cm. No intrahepatic biliary ductal dilatation. Cholecystectomy. Pancreas: The pancreas is unremarkable as visualized. Spleen: Normal in size without focal abnormality. Adrenals/Urinary Tract: The adrenal glands are unremarkable. There is no hydronephrosis on either side. There is symmetric enhancement and excretion of contrast by both kidneys. The visualized ureters and urinary bladder appear unremarkable. Stomach/Bowel: There is no bowel obstruction or active inflammation. The appendix is normal. Vascular/Lymphatic: Mild aortoiliac atherosclerotic disease. The IVC is unremarkable. No portal venous gas. Retroperitoneal and porta hepatic adenopathy, new since the prior CT of 02/19/2019. For example an enlarged periportal lymph node measures 2 cm in short axis. Retrocaval lymph node measures 2.1 cm in short axis. Reproductive: The prostate and seminal vesicles are grossly unremarkable. Other: None Musculoskeletal: Multilevel degenerative changes of the spine with disc desiccation and vacuum phenomena. No acute osseous pathology. Review of the MIP images confirms the above findings. IMPRESSION: 1. Findings most consistent with right lower lobe pneumonia and associated reactive mild interstitial edema. Clinical correlation and follow-up to resolution recommended. 2. Interval  development of significant thoracic, and retroperitoneal adenopathy as well as extensive hepatic metastatic disease since the CT of 02/19/2019. 3. Cirrhosis with small ascites. 4. Aortic Atherosclerosis (ICD10-I70.0). Electronically Signed   By: Anner Crete M.D.   On: 05/28/2019 18:00   CT ABDOMEN PELVIS W CONTRAST  Result Date: 05/28/2019 CLINICAL DATA:  59 year old male with shortness of breath and generalized pain. History of esophageal cancer. EXAM: CT ANGIOGRAPHY CHEST CT ABDOMEN AND PELVIS WITH CONTRAST TECHNIQUE: Multidetector CT imaging of the chest was performed using the standard protocol during bolus administration of intravenous contrast. Multiplanar CT image reconstructions and MIPs were obtained to evaluate the vascular anatomy. Multidetector CT imaging of the abdomen and pelvis was performed using the standard protocol during bolus administration of intravenous contrast. CONTRAST:  98m OMNIPAQUE IOHEXOL 350 MG/ML SOLN COMPARISON:  CT of the chest abdomen pelvis dated 02/19/2019. PET CT dated 05/21/2019. FINDINGS: CTA CHEST FINDINGS Cardiovascular: There is no cardiomegaly or pericardial effusion. Advanced 3 vessel coronary vascular calcification. There is mild atherosclerotic calcification of the thoracic aorta. No aneurysmal dilatation or dissection. Partially visualized Port-A-Cath with tip in the central SVC close to the cavoatrial junction. Evaluation of the pulmonary arteries is very limited due to respiratory motion artifact and suboptimal opacification and timing of the contrast. No large or central pulmonary artery embolus identified. Mediastinum/Nodes: Bilateral hilar and mediastinal adenopathy new since the CT of 02/19/2019. Right hilar lymph nodes measure up to 2.3 cm in short axis. Subcarinal adenopathy measures 2.5 cm in short axis. The esophagus is grossly unremarkable but suboptimally visualized due to mediastinal adenopathy. No mediastinal fluid collection. Lungs/Pleura:  Faint area of opacity at the right lung base similar to the PET-CT of 05/21/2019 most consistent with pneumonia. Clinical correlation and follow-up recommended. There is diffuse interstitial and interlobular septal thickening of the right lung with mild nodularity most likely infectious or inflammatory in etiology.  There is probably mild associated or reactive interstitial edema in the right lung. The left lung is clear. There is no pleural effusion or pneumothorax. The central airways are patent. Musculoskeletal: Partially visualized right supraclavicular lymph node measures approximately 14 mm (series 4, image 1). Mildly rounded lymph node in the posterior neck deep to the right thyroid lobe (series 4, image 4) measures approximately 12 mm. No definite axillary adenopathy. Mild degenerative changes of the spine. No acute osseous pathology. Review of the MIP images confirms the above findings. CT ABDOMEN and PELVIS FINDINGS No intra-abdominal free air. Small ascites. Hepatobiliary: Cirrhosis. Innumerable hepatic hypodense lesions, new since the CT of 02/19/2019 consistent with metastatic disease. The largest lesion or confluent of lesions in the right lobe of the liver measures approximately 6.7 x 7.7 cm. No intrahepatic biliary ductal dilatation. Cholecystectomy. Pancreas: The pancreas is unremarkable as visualized. Spleen: Normal in size without focal abnormality. Adrenals/Urinary Tract: The adrenal glands are unremarkable. There is no hydronephrosis on either side. There is symmetric enhancement and excretion of contrast by both kidneys. The visualized ureters and urinary bladder appear unremarkable. Stomach/Bowel: There is no bowel obstruction or active inflammation. The appendix is normal. Vascular/Lymphatic: Mild aortoiliac atherosclerotic disease. The IVC is unremarkable. No portal venous gas. Retroperitoneal and porta hepatic adenopathy, new since the prior CT of 02/19/2019. For example an enlarged  periportal lymph node measures 2 cm in short axis. Retrocaval lymph node measures 2.1 cm in short axis. Reproductive: The prostate and seminal vesicles are grossly unremarkable. Other: None Musculoskeletal: Multilevel degenerative changes of the spine with disc desiccation and vacuum phenomena. No acute osseous pathology. Review of the MIP images confirms the above findings. IMPRESSION: 1. Findings most consistent with right lower lobe pneumonia and associated reactive mild interstitial edema. Clinical correlation and follow-up to resolution recommended. 2. Interval development of significant thoracic, and retroperitoneal adenopathy as well as extensive hepatic metastatic disease since the CT of 02/19/2019. 3. Cirrhosis with small ascites. 4. Aortic Atherosclerosis (ICD10-I70.0). Electronically Signed   By: Anner Crete M.D.   On: 05/28/2019 18:00   NM PET Image Restag (PS) Skull Base To Thigh  Result Date: 05/21/2019 CLINICAL DATA:  Subsequent treatment strategy for distal esophageal cancer. EXAM: NUCLEAR MEDICINE PET SKULL BASE TO THIGH TECHNIQUE: 15.9 mCi F-18 FDG was injected intravenously. Full-ring PET imaging was performed from the skull base to thigh after the radiotracer. CT data was obtained and used for attenuation correction and anatomic localization. Fasting blood glucose: 91 mg/dl COMPARISON:  CT abdomen/pelvis dated 03/01/2019. PET-CT dated 11/27/2018. FINDINGS: Mediastinal blood pool activity: SUV max 3.4 Liver activity: SUV max NA NECK: No hypermetabolic cervical lymphadenopathy. Focal hypermetabolism within the left pterygoid muscle, max SUV 5.6, although without CT abnormality. This appearance is indeterminate but of questionable clinical significance given the additional findings described below. Incidental CT findings: none CHEST: Patchy/nodular opacity in the posterior right lower lobe (series 3/image 136), max SUV 10.8. This may reflect mild infection/pneumonia, although tumor is not  excluded given additional findings. Widespread thoracic nodal metastases, including: --14 mm short axis right supraclavicular node (series 3/image 68), max SUV 13.1 --14 mm short axis right paratracheal node (series 3/image 89), max SUV 14.3 --23 mm short axis subcarinal node (series 3/image 107), max SUV 19.0 --Approximate 24 mm short axis right hilar node (series 3/image 103), max SUV 15.9 --Approximate 17 mm short axis left hilar node (series 3/image 106), max SUV 10.7 No associated hypermetabolism involving the lower esophagus at the GE junction. No associated  soft Jewel mass on CT. Incidental CT findings: Right chest port terminates at the cavoatrial junction. Atherosclerotic calcifications of the aortic arch. 3 vessel coronary atherosclerosis. ABDOMEN/PELVIS: Cirrhosis. Although poorly evaluated on unenhanced CT, there is suspected multifocal hepatic metastases with a heterogeneous dominant mass in the posterior right hepatic lobe, max SUV 18.8. Pancreas is poorly evaluated but there is no definite pancreatic mass, atrophy, or ductal dilatation. No abnormal hypermetabolism in the spleen or adrenal glands. Widespread abdominopelvic nodal metastases, including: --22 mm short axis peripancreatic node in the porta hepatis (series 2/image 162), max SUV 26.5 --27 mm short axis portacaval node (series 3/image 170), max SUV 24.1 --15 mm short axis left para-aortic node (series 3/image 137), max SUV 20.4 --20 mm short axis right retrocaval node (series 3/image 190), max SUV 24.4 Incidental CT findings: Prior cholecystectomy. Mild atherosclerotic calcifications the abdominal aorta and branch vessels. Dystrophic calcifications involving the prostate. SKELETON: Widespread osseous metastases throughout the visualized axial and appendicular skeleton, including: --Right C1 pedicle, max SUV 7.3 --Sternum, max SUV 7.6 --Left humeral head, max SUV 14.6 --Left lateral 4th rib, max SUV 5.7 --Right T12 vertebral body, max SUV 21.9  ---Left L2 vertebral body, max SUV 17.3 --Left sacrum, max SUV 12.0 --Right iliac bone, max SUV 12.8 --Right anterior process the acetabulum, max SUV 13.2 --Left proximal femur, max SUV 5.9 No evidence of pathologic fracture. Incidental CT findings: Degenerative changes of the visualized thoracolumbar spine. IMPRESSION: No evidence of residual lower esophageal mass or hypermetabolism. Widespread thoracic, abdominal, and retroperitoneal nodal metastases, as above. Widespread osseous metastases, as above. Suspected multifocal hepatic metastases, including a dominant mass in the right hepatic lobe, poorly visualized/evaluated. Underlying cirrhosis. Patchy right lower lobe opacity, favoring pneumonia, tumor not excluded. Technically speaking, while this is presumed to reflect recurrence of the patient's known esophageal cancer, that is not confirmed. Regardless, the patient's right supraclavicular nodes would be amenable to excision/sampling if needed. Additional ancillary findings as above. Electronically Signed   By: Julian Hy M.D.   On: 05/21/2019 13:45   DG Chest Port 1 View  Result Date: 05/28/2019 CLINICAL DATA:  Generalized aches with shortness of breath. EXAM: PORTABLE CHEST 1 VIEW COMPARISON:  April 27, 2010 FINDINGS: A right-sided venous Port-A-Cath is seen with its distal tip overlying the distal aspect of the superior vena cava. Mild atelectasis and/or infiltrate is seen within the right lung base. There is no evidence of a pleural effusion or pneumothorax. The heart size and mediastinal contours are within normal limits. Degenerative changes seen throughout the thoracic spine. Prominent small bowel loops are suspected within the visualized portion of the upper abdomen. IMPRESSION: 1. Mild atelectasis and/or infiltrate within the right lung base. 2. Prominent small bowel loops within the upper abdomen. Sequelae associated with a partial small bowel obstruction versus ileus cannot be excluded.  Correlation with abdominal plain films is recommended. Electronically Signed   By: Virgina Norfolk M.D.   On: 05/28/2019 17:08    PERFORMANCE STATUS (ECOG) : 4 - Bedbound  Review of Systems Unless otherwise noted, a complete review of systems is negative.  Physical Exam General: Ill-appearing Cardiovascular: Tachycardic Pulmonary: Labored with exertion, on O2 Abdomen: Hepatomegaly Skin: no rashes Neurological: Weakness but otherwise nonfocal  IMPRESSION: Patient declined overnight.  He was transferred to the ICU for consideration of BiPAP.  Transaminases are worse today.  Lactic acid is also uptrending.  I met privately with patient's wife.  She verbalized an understanding of patient's decline today.  She discussed candidly about  the possibility that patient may not survive this illness.  She also recognizes that there may not be a viable path forward for treatment of the cancer.  Her goal is to try to get family and her pastor in to see him today while he can interact with them.  She did ask me about comfort care, which we discussed in detail.  I discussed CODE STATUS with both patient and wife.  Both verbalized a desire for him not to be resuscitated or intubated.  CODE STATUS changed to DNR/DNI to reflect patient/family decision.  They are in agreement with continuing the current scope of treatment.  However, again, we did discuss the option of comfort care in the event of decline.  PLAN: -Continue current scope of treatment -DNR/DNI -Will follow   Time Total: 30 minutes  Visit consisted of counseling and education dealing with the complex and emotionally intense issues of symptom management and palliative care in the setting of serious and potentially life-threatening illness.Greater than 50%  of this time was spent counseling and coordinating care related to the above assessment and plan.  Signed by: Altha Harm, PhD, NP-C

## 2019-06-01 NOTE — Consult Note (Signed)
CRITICAL CARE NOTE  SYNOPSIS 59 y.o. 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  Patient was admitted to the hospital on 05/28/2019 with pneumonia after having 4 to 5 days of progressive dyspnea.  Hospitalization has been complicated by persistent exertional dyspnea/hypoxia.  Palliative care was consulted help address goals and manage ongoing symptoms.  SOCIAL HISTORY:     reports that he has never smoked. He has never used smokeless tobacco. He reports previous alcohol use. He reports that he does not use drugs.   Patient is married and lives at home with his wife.  He has no biological children but helped raise 2 step children.  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    CC  ACUTE RESP FAILURE  HPI Admitted for acute progressive SOB Transferred to ICU for increased WOB and resp failure LA is elevated LFT's elevated BP soft  Patient with progressive SOB and resp distress Wife/Patient updated at bedside Patient is critically ill Prognosis is grave  I have addressed Goals of Care and will need to clarfiy CT chest reviewed-Rt sided subtle pneumonia  BP 115/74 (BP Location: Right Arm)   Pulse (!) 125   Temp 98.4 F (36.9 C) (Oral)   Resp (!) 26   Ht 5\' 10"  (1.778 m)   Wt 133.5 kg   SpO2 (!) 88%   BMI 42.23 kg/m    I/O last 3 completed shifts: In: 1069.8 [P.O.:720; IV Piggyback:349.8] Out: 200 [Urine:200] Total I/O In: -  Out: 600 [Urine:600]  SpO2: (!) 88 % O2 Flow Rate (L/min): 4 L/min  Estimated body mass index is 42.23 kg/m as calculated from the following:   Height as of this encounter: 5\' 10"  (1.778 m).   Weight as of this encounter: 133.5 kg.  SIGNIFICANT EVENTS 3/26 Transferred to ICU for Resp distress and Acidosis  REVIEW OF SYSTEMS  Review of Systems:  Gen:  Weakness using accessory muscles to breathe HEENT: Denies blurred  vision, double vision, ear pain, eye pain, hearing loss, nose bleeds, sore throat Cardiac:  +chest pain or heaviness, +chest tightness,edema, No JVD Resp:   +shortness of breath,-wheezing, -hemoptysis,  Gi: +stomach pain, +nausea Gu:  Denies bladder incontinence, burning urine Ext:   Denies Joint pain, stiffness or swelling Skin: Denies  skin rash, easy bruising or bleeding or hives Endoc:  Denies polyuria, polydipsia , polyphagia or weight change Psych:   Denies depression, insomnia or hallucinations  Other:  All other systems negative    PHYSICAL EXAMINATION:  GENERAL:critically ill appearing, +resp distress HEAD: Normocephalic, atraumatic.  EYES: Pupils equal, round, reactive to light.  No scleral icterus.  MOUTH: Moist mucosal membrane. NECK: Supple.  PULMONARY: +rhonchi,  CARDIOVASCULAR: S1 and S2. Regular rate and rhythm. No murmurs, rubs, or gallops.  GASTROINTESTINAL: Soft, nontender, -distended.  Positive bowel sounds.   MUSCULOSKELETAL: No swelling, clubbing, or edema.  NEUROLOGIC:AA0x4 SKIN:intact,warm,dry  MEDICATIONS: I have reviewed all medications and confirmed regimen as documented   CULTURE RESULTS   Recent Results (from the past 240 hour(s))  Respiratory Panel by RT PCR (Flu A&B, Covid) - Nasopharyngeal Swab     Status: None   Collection Time: 05/28/19  4:44 PM   Specimen: Nasopharyngeal Swab  Result Value Ref Range Status   SARS Coronavirus 2 by RT PCR NEGATIVE NEGATIVE Final    Comment: (NOTE) SARS-CoV-2 target nucleic acids are NOT DETECTED. The SARS-CoV-2 RNA is generally detectable in upper respiratoy  specimens during the acute phase of infection. The lowest concentration of SARS-CoV-2 viral copies this assay can detect is 131 copies/mL. A negative result does not preclude SARS-Cov-2 infection and should not be used as the sole basis for treatment or other patient management decisions. A negative result may occur with  improper specimen  collection/handling, submission of specimen other than nasopharyngeal swab, presence of viral mutation(s) within the areas targeted by this assay, and inadequate number of viral copies (<131 copies/mL). A negative result must be combined with clinical observations, patient history, and epidemiological information. The expected result is Negative. Fact Sheet for Patients:  PinkCheek.be Fact Sheet for Healthcare Providers:  GravelBags.it This test is not yet ap proved or cleared by the [place] FDA and  has been authorized for detection and/or diagnosis of SARS-CoV-2 by FDA under an Emergency Use Authorization (EUA). This EUA will remain  in effect (meaning this test can be used) for the duration of the COVID-19 declaration under Section 564(b)(1) of the Act, 21 U.S.C. section 360bbb-3(b)(1), unless the authorization is terminated or revoked sooner.    Influenza A by PCR NEGATIVE NEGATIVE Final   Influenza B by PCR NEGATIVE NEGATIVE Final    Comment: (NOTE) The Xpert Xpress SARS-CoV-2/FLU/RSV assay is intended as an aid in  the diagnosis of influenza from Nasopharyngeal swab specimens and  should not be used as a sole basis for treatment. Nasal washings and  aspirates are unacceptable for Xpert Xpress SARS-CoV-2/FLU/RSV  testing. Fact Sheet for Patients: PinkCheek.be Fact Sheet for Healthcare Providers: GravelBags.it This test is not yet approved or cleared by the [place] FDA and  has been authorized for detection and/or diagnosis of SARS-CoV-2 by  FDA under an Emergency Use Authorization (EUA). This EUA will remain  in effect (meaning this test can be used) for the duration of the  Covid-19 declaration under Section 564(b)(1) of the Act, 21  U.S.C. section 360bbb-3(b)(1), unless the authorization is  terminated or revoked. Performed at [facility],  [address] [place], [place] [address]   Blood culture (routine x 2)     Status: None (Preliminary result)   Collection Time: 05/28/19  7:10 PM   Specimen: BLOOD  Result Value Ref Range Status   Specimen Description BLOOD LEFT ANTECUBITAL  Final   Special Requests   Final    BOTTLES DRAWN AEROBIC AND ANAEROBIC Blood Culture adequate volume   Culture   Final    NO GROWTH 4 DAYS Performed at [facility], [address], [place],  [address]    Report Status PENDING  Incomplete  Blood culture (routine x 2)     Status: None (Preliminary result)   Collection Time: 05/28/19  7:25 PM   Specimen: BLOOD  Result Value Ref Range Status   Specimen Description BLOOD BLOOD LEFT WRIST  Final   Special Requests   Final    BOTTLES DRAWN AEROBIC AND ANAEROBIC Blood Culture results may not be optimal due to an excessive volume of blood received in culture bottles   Culture   Final    NO GROWTH 4 DAYS Performed at [facility], [address] [place],  [address]    Report Status PENDING  Incomplete          IMAGING    DG Chest 1 View  Result Date: 06/01/2019 CLINICAL DATA:  Hypoxia EXAM: CHEST  1 VIEW COMPARISON:  Four days ago FINDINGS: Porta catheter with tip at the SVC. Interstitial prominence similar to  prior. Airway thickening and mild pneumonia seen by recent chest CT. Stable heart size and mediastinal contours; there is thoracic adenopathy by recent chest CT. No effusion or pneumothorax. IMPRESSION: Airway thickening and right lower lobe pneumonia by recent chest CT. No radiographic change since prior. Electronically Signed   By: Monte Fantasia M.D.   On: 06/01/2019 06:56        ASSESSMENT AND PLAN SYNOPSIS  59 yo morbidly obese WM with acute and severe resp failure due to metabolic acidosis due to metastatic liver disease/failure from esoph cancer with relative hypotension and due to pneumonia  AT THIS TIME, I BELIEVE THAT PROGRESSIVE  ESOPH METS TO LIVER IS CAUSING PATIENTS DEMISE MORE SO THAN PNEUMONIA. OVERALL PROGNOSIS IS GRAVE.   Severe ACUTE Hypoxic and Hypercapnic Respiratory Failure Patient on at home CPAP Continue oxygen as prescribed High risk for intubation-cardiac arrest-death  ANXIETY Decrease steroids Start precedex   Severe sepsis/shock-hypovolemia Give LR bolus Repeat LA Continue IV abx   CARDIAC ICU monitoring  ID -continue IV abx as prescibed -follow up cultures  GI GI PROPHYLAXIS as indicated  DIET-->NPO for now Constipation protocol as indicated  ENDO - will use ICU hypoglycemic\Hyperglycemia protocol if indicated   ELECTROLYTES -follow labs as needed -replace as needed -pharmacy consultation and following   DVT/GI PRX ordered TRANSFUSIONS AS NEEDED MONITOR FSBS ASSESS the need for LABS as needed   Critical Care Time devoted to patient care services described in this note is 52 minutes.   Overall, patient is critically ill, prognosis is guarded.  Patient with Multiorgan failure and at high risk for cardiac arrest and death.   RECOMMEND DNR/DNI STATUS PALLIATIVE CARE FOLLOWING  Corrin Parker, M.D.  [facility]  Medical Director [place] Director [facility] Cardio-Pulmonary Department

## 2019-06-01 NOTE — Progress Notes (Signed)
Matthew Yang visited  pt. per OR for AD passed on from other chaplains.  Pt. sitting up in bed w wife Matthew Yang @  bedside.  Pt. shared he has pneumonia and esophageal cancer; cancer was treated and removed from esophagus, but recent scans show met. to abdomen and liver; pt. is concerned b/c cancer cannot be treated until his pneumonia clears.  Pt. wanted to complete AD; wife Matthew Yang [phone]) made MPOA.  Pt. would NOT like life prolonged in any of the situations on Living Will (incurable disease, apparently permanent unconsciousness, advanced dementia); pt. interested in comfort care; requests Living Will be followed if he cannot speak for himself.  Wife encouraged pt. to express his own decisions and understands pt.'s wishes in case she needs to act on his behalf.  CH explained that document needs two non-hospital employees as witnesses, recommended family bring doc to bank post-discharge.  CH will place copy of AD in chart for reference w/understanding the doc is not yet notarized.  CH also completed [facility] GOC form w/pt. (see ACP tab).  No further needs expressed at this time.  Matthew Yang spoke briefly w/palliative NP Josh @ RN station after visit.

## 2019-06-01 NOTE — Progress Notes (Signed)
Matthew Yang was visiting a patient at our hospital 05/28/2019. Please excuse his absence from work due to his visitation.   Treatment Team:  Attending Provider: Lavina Hamman, MD

## 2019-06-01 NOTE — Progress Notes (Signed)
Matthew Yang   DOB:18-Jun-1960   FU#:[id number]    Subjective: This morning patient had worsening shortness of breath; and dropped his oxygen saturations.  Chest x-ray done; IV Lasix given.  Patient is currently on 5 L nasal cannula-saturating anywhere between 89-92.  Previously patient was on about 2 L of oxygen.  Pain improved.  Continues to complain of anxiety.  Objective:  Vitals:   06/01/19 0900 06/01/19 1055  BP:  115/74  Pulse: (!) 124 (!) 125  Resp: 17 (!) 26  Temp:  98.4 F (36.9 C)  SpO2: 92% (!) 88%     Intake/Output Summary (Last 24 hours) at 06/01/2019 1205 Last data filed at 06/01/2019 0800 Gross per 24 hour  Intake 360 ml  Output 600 ml  Net -240 ml    Physical Exam  Constitutional: He is oriented to person, place, and time and well-developed, well-nourished, and in no distress.  Accompanied by his wife.  Patient anxious.  On 5 L nasal cannula.  HENT:  Head: Normocephalic and atraumatic.  Mouth/Throat: Oropharynx is clear and moist. No oropharyngeal exudate.  Eyes: Pupils are equal, round, and reactive to light.  Cardiovascular: Regular rhythm.  Tachycardic.  Pulmonary/Chest: No respiratory distress. He has no wheezes.  Decreased air entry bilaterally at the bases.  Abdominal: Soft. Bowel sounds are normal. He exhibits no distension and no mass. There is no abdominal tenderness. There is no rebound and no guarding.  Possible hepatomegaly.  Musculoskeletal:        General: No tenderness or edema. Normal range of motion.     Cervical back: Normal range of motion and neck supple.  Neurological: He is alert and oriented to person, place, and time.  Skin: Skin is warm.  Psychiatric: Affect normal.  Anxious.     Labs:  Lab Results  Component Value Date   WBC 9.2 06/01/2019   HGB 11.8 (L) 06/01/2019   HCT 34.7 (L) 06/01/2019   MCV 92.0 06/01/2019   PLT 128 (L) 06/01/2019   NEUTROABS 6.7 06/01/2019    Lab Results  Component Value Date   NA 133 (L) 06/01/2019    K 4.5 06/01/2019   CL 99 06/01/2019   CO2 18 (L) 06/01/2019    Studies:  DG Chest 1 View  Result Date: 06/01/2019 CLINICAL DATA:  Hypoxia EXAM: CHEST  1 VIEW COMPARISON:  Four days ago FINDINGS: Porta catheter with tip at the SVC. Interstitial prominence similar to prior. Airway thickening and mild pneumonia seen by recent chest CT. Stable heart size and mediastinal contours; there is thoracic adenopathy by recent chest CT. No effusion or pneumothorax. IMPRESSION: Airway thickening and right lower lobe pneumonia by recent chest CT. No radiographic change since prior. Electronically Signed   By: Monte Fantasia M.D.   On: 06/01/2019 06:56    Malignant neoplasm of lower third of esophagus ([place]) 59 year old male patient with a history of metastatic adenocarcinoma [poorly differentiated/question signet ring morphology] lower third of esophagus/GE junction-is currently admitted to hospital for pneumonia.  #Acute respiratory failure/underlying pneumonia versus others-not improving.  Currently on oral antibiotics.  S/p Lasix.  Chest x-ray no significant difference since admission.  Awaiting pulmonary evaluation.  #Stage IV adenocarcinoma the esophagus-progression noted on recent scans; Unfortunately elevated liver numbers-bilirubin up to 4/likely secondary to underlying malignancy.  This continues to be concerning.  Monitor for now  #Abdominal pain/right flank-/secondary to hepatomegaly/malignancy-recommend morphine 2 mg every 6 hours as needed.  Stable  #Anxiety-worse; recommend Ativan 1 mg every 8 hours as  needed.  Reminded the patient to ask for Ativan as needed.  #Overall prognosis/goals of care: Patient met with palliative care yesterday.  He continues to be full code.  Understands that at least for foreseeable future he can go back to work-given his clinical condition.  Discussed with the patient's wife.  Discussed with Dr. Posey Pronto.  Dr. Janese Banks on call over the weekend.  Cammie Sickle,  MD 06/01/2019  12:05 PM

## 2019-06-01 NOTE — TOC Initial Note (Signed)
Transition of Care [facility]) - Initial/Assessment Note    Patient Details  Name: Matthew Yang MRN: [id number] Date of Birth: [birth year]  Transition of Care [facility]) CM/SW Contact:    Shelbie Hutching, RN Phone Number: 06/01/2019, 12:46 PM  Clinical Narrative:                 Patient admitted with pneumonia has stage 4 esophageal cancer with mets.  Palliative care is following, wife has been present at the bedside.   PT has seen patient and recommended home health, patient just transferred to the ICU and not ready to discuss home health at this time.  TOC team will cont to follow and assist with discharge planning as needed.   Expected Discharge Plan: [place] Barriers to Discharge: Continued Medical Work up   Patient Goals and CMS Choice        Expected Discharge Plan and Services Expected Discharge Plan: [place]   Discharge Planning Services: CM Consult Post Acute Care Choice: [place] arrangements for the past 2 months: Single Family Home                                      Prior Living Arrangements/Services Living arrangements for the past 2 months: Single Family Home Lives with:: Spouse Patient language and need for interpreter reviewed:: Yes Do you feel safe going back to the place where you live?: Yes      Need for Family Participation in Patient Care: Yes (Comment)(cancer with mets) Care giver support system in place?: Yes (comment)(wife)   Criminal Activity/Legal Involvement Pertinent to Current Situation/Hospitalization: No - Comment as needed  Activities of Daily Living Home Assistive Devices/Equipment: CPAP ADL Screening (condition at time of admission) Patient's cognitive ability adequate to safely complete daily activities?: Yes Is the patient deaf or have difficulty hearing?: No Does the patient have difficulty seeing, even when wearing glasses/contacts?: No Does the patient have difficulty concentrating,  remembering, or making decisions?: No Patient able to express need for assistance with ADLs?: Yes Does the patient have difficulty dressing or bathing?: No Independently performs ADLs?: No Communication: Independent Dressing (OT): Independent Grooming: Independent Feeding: Independent Bathing: Independent Toileting: Independent In/Out Bed: Independent Walks in Home: Independent Does the patient have difficulty walking or climbing stairs?: Yes Weakness of Legs: Both Weakness of Arms/Hands: None  Permission Sought/Granted Permission sought to share information with : Case Manager, Family Supports Permission granted to share information with : Yes, Verbal Permission Granted        Permission granted to share info w Relationship: wife     Emotional Assessment   Attitude/Demeanor/Rapport: Engaged Affect (typically observed): Accepting Orientation: : Oriented to Self, Oriented to Place, Oriented to  Time, Oriented to Situation Alcohol / Substance Use: Not Applicable Psych Involvement: No (comment)  Admission diagnosis:  CAP (community acquired pneumonia) [J18.9] Community acquired pneumonia of right lower lobe of lung [J18.9] Patient Active Problem List   Diagnosis Date Noted  . Anxiety state   . Palliative care encounter   . CAP (community acquired pneumonia) 05/28/2019  . Malignant neoplasm of lower third of esophagus ([place]) 08/15/2018  . Tricompartment osteoarthritis of knee 12/26/2014  . Arthritis, senescent 12/26/2014  . Right knee pain 12/26/2014  . Knee derangement syndrome 12/26/2014  . Goals of care, counseling/discussion 08/30/2014  . Type 2 diabetes mellitus ([place]) 09/30/2013  . HCV (hepatitis  C virus) 09/30/2013  . Benign hypertension 09/30/2013  . Morbid (severe) obesity due to excess calories ([place]) 09/30/2013  . Obstructive apnea 09/30/2013   PCP:  Kirk Ruths, MD Pharmacy:   [place], [place] [address] [place] [address] Phone: [phone] Fax: [phone]  [place], [place] [address] Phone: [phone] Fax: [phone]  [id number] - [place], [place] [address] Phone: [phone] Fax: [phone]     Social Determinants of Health (SDOH) Interventions    Readmission Risk Interventions No flowsheet data found.

## 2019-06-01 NOTE — Telephone Encounter (Signed)
Wife's Fmla faxed to [employer]

## 2019-06-02 DIAGNOSIS — R0902 Hypoxemia: Secondary | ICD-10-CM

## 2019-06-02 DIAGNOSIS — R0682 Tachypnea, not elsewhere classified: Secondary | ICD-10-CM

## 2019-06-02 LAB — CULTURE, BLOOD (ROUTINE X 2)
Culture: NO GROWTH
Culture: NO GROWTH
Special Requests: ADEQUATE

## 2019-06-02 MED ORDER — LORAZEPAM 2 MG/ML IJ SOLN: 1.0000 mg | INTRAMUSCULAR | Status: DC | PRN

## 2019-06-02 MED ORDER — POLYETHYLENE GLYCOL 3350 17 G PO PACK
17.0000 g | PACK | Freq: Every day | ORAL | Status: DC
  Administered 2019-06-02: 17 g via ORAL
  Filled 2019-06-02: qty 1

## 2019-06-02 MED ORDER — BIOTENE DRY MOUTH MT LIQD: 15.0000 mL | OROMUCOSAL | Status: DC | PRN

## 2019-06-02 MED ORDER — DIPHENHYDRAMINE HCL 50 MG/ML IJ SOLN: 12.5000 mg | INTRAMUSCULAR | Status: DC | PRN

## 2019-06-02 MED ORDER — OXYCODONE HCL ER 10 MG PO T12A
10.0000 mg | EXTENDED_RELEASE_TABLET | Freq: Two times a day (BID) | ORAL | Status: DC
  Administered 2019-06-02: 10 mg via ORAL
  Filled 2019-06-02: qty 1

## 2019-06-02 MED ORDER — ACETAMINOPHEN 650 MG RE SUPP: 650.0000 mg | Freq: Four times a day (QID) | RECTAL | Status: DC | PRN

## 2019-06-02 MED ORDER — SENNOSIDES-DOCUSATE SODIUM 8.6-50 MG PO TABS: 1.0000 | ORAL_TABLET | Freq: Two times a day (BID) | ORAL | Status: DC | PRN

## 2019-06-02 MED ORDER — ONDANSETRON HCL 8 MG PO TABS: 8.0000 mg | ORAL_TABLET | Freq: Three times a day (TID) | ORAL | 0 refills | Status: AC | PRN

## 2019-06-02 MED ORDER — OXYCODONE HCL 5 MG PO TABS: 5.0000 mg | ORAL_TABLET | ORAL | Status: DC | PRN

## 2019-06-02 MED ORDER — GLYCOPYRROLATE 1 MG PO TABS
1.0000 mg | ORAL_TABLET | ORAL | Status: DC | PRN
  Filled 2019-06-02: qty 1

## 2019-06-02 MED ORDER — ORAL CARE MOUTH RINSE
15.0000 mL | Freq: Two times a day (BID) | OROMUCOSAL | Status: DC
  Administered 2019-06-02: 15 mL via OROMUCOSAL

## 2019-06-02 MED ORDER — ACETAMINOPHEN 325 MG PO TABS: 650.0000 mg | ORAL_TABLET | Freq: Four times a day (QID) | ORAL | Status: DC | PRN

## 2019-06-02 MED ORDER — SIMETHICONE 80 MG PO CHEW: 80.0000 mg | CHEWABLE_TABLET | Freq: Four times a day (QID) | ORAL | 0 refills | Status: AC

## 2019-06-02 MED ORDER — LORAZEPAM 2 MG/ML PO CONC
1.0000 mg | ORAL | Status: DC | PRN
  Filled 2019-06-02: qty 0.5

## 2019-06-02 MED ORDER — HALOPERIDOL LACTATE 2 MG/ML PO CONC
2.0000 mg | ORAL | Status: DC | PRN
  Filled 2019-06-02: qty 1

## 2019-06-02 MED ORDER — CLONAZEPAM 0.125 MG PO TBDP: 0.1250 mg | ORAL_TABLET | Freq: Two times a day (BID) | ORAL | 0 refills | Status: AC

## 2019-06-02 MED ORDER — HALOPERIDOL 2 MG PO TABS: 2.0000 mg | ORAL_TABLET | Freq: Four times a day (QID) | ORAL | 0 refills | Status: AC | PRN

## 2019-06-02 MED ORDER — POLYVINYL ALCOHOL 1.4 % OP SOLN
1.0000 [drp] | Freq: Four times a day (QID) | OPHTHALMIC | Status: DC | PRN
  Filled 2019-06-02: qty 15

## 2019-06-02 MED ORDER — POLYETHYLENE GLYCOL 3350 17 G PO PACK: 17.0000 g | PACK | Freq: Every day | ORAL | 0 refills | Status: AC

## 2019-06-02 MED ORDER — SIMETHICONE 80 MG PO CHEW
80.0000 mg | CHEWABLE_TABLET | Freq: Four times a day (QID) | ORAL | Status: DC
  Administered 2019-06-02 (×2): 80 mg via ORAL
  Filled 2019-06-02 (×4): qty 1

## 2019-06-02 MED ORDER — ONDANSETRON HCL 4 MG/2ML IJ SOLN
4.0000 mg | Freq: Four times a day (QID) | INTRAMUSCULAR | Status: DC | PRN
  Administered 2019-06-02: 4 mg via INTRAVENOUS
  Filled 2019-06-02: qty 2

## 2019-06-02 MED ORDER — MORPHINE SULFATE (CONCENTRATE) 10 MG/0.5ML PO SOLN: 5.0000 mg | ORAL | Status: DC | PRN

## 2019-06-02 MED ORDER — MAGIC MOUTHWASH
15.0000 mL | Freq: Four times a day (QID) | ORAL | Status: DC | PRN
  Filled 2019-06-02: qty 20

## 2019-06-02 MED ORDER — LORAZEPAM 1 MG PO TABS: 1.0000 mg | ORAL_TABLET | ORAL | Status: DC | PRN

## 2019-06-02 MED ORDER — OXYCODONE HCL ER 10 MG PO T12A: 10.0000 mg | EXTENDED_RELEASE_TABLET | Freq: Two times a day (BID) | ORAL | 0 refills | Status: AC

## 2019-06-02 MED ORDER — HALOPERIDOL 2 MG PO TABS
2.0000 mg | ORAL_TABLET | ORAL | Status: DC | PRN
  Filled 2019-06-02: qty 1

## 2019-06-02 MED ORDER — ALBUTEROL SULFATE (2.5 MG/3ML) 0.083% IN NEBU: 2.5000 mg | INHALATION_SOLUTION | RESPIRATORY_TRACT | Status: DC | PRN

## 2019-06-02 MED ORDER — LORAZEPAM 2 MG/ML PO CONC: 1.0000 mg | ORAL | 0 refills | Status: AC | PRN

## 2019-06-02 MED ORDER — MORPHINE SULFATE (CONCENTRATE) 10 MG/0.5ML PO SOLN: 5.0000 mg | ORAL | 0 refills | Status: AC | PRN

## 2019-06-02 MED ORDER — MORPHINE SULFATE (PF) 2 MG/ML IV SOLN
2.0000 mg | INTRAVENOUS | Status: DC | PRN
  Administered 2019-06-02: 2 mg via INTRAVENOUS
  Filled 2019-06-02: qty 1

## 2019-06-02 MED ORDER — HALOPERIDOL LACTATE 5 MG/ML IJ SOLN: 2.0000 mg | INTRAMUSCULAR | Status: DC | PRN

## 2019-06-02 MED ORDER — OXYCODONE HCL ER 15 MG PO T12A: 15.0000 mg | EXTENDED_RELEASE_TABLET | Freq: Two times a day (BID) | ORAL | Status: DC

## 2019-06-02 MED ORDER — GLYCOPYRROLATE 0.2 MG/ML IJ SOLN: 0.2000 mg | INTRAMUSCULAR | Status: DC | PRN

## 2019-06-02 MED ORDER — CLONAZEPAM 0.125 MG PO TBDP
0.1250 mg | ORAL_TABLET | Freq: Two times a day (BID) | ORAL | Status: DC
  Administered 2019-06-02: 10:00:00 0.125 mg via ORAL
  Filled 2019-06-02: qty 1

## 2019-06-02 MED ORDER — BISACODYL 10 MG RE SUPP: 10.0000 mg | Freq: Every day | RECTAL | Status: DC | PRN

## 2019-06-02 MED ORDER — ONDANSETRON 4 MG PO TBDP
4.0000 mg | ORAL_TABLET | Freq: Four times a day (QID) | ORAL | Status: DC | PRN
  Filled 2019-06-02: qty 1

## 2019-06-02 NOTE — Progress Notes (Signed)
Hematology/Oncology Consult note [facility]  Telephone:([phone] [phone]  Patient Care Team: Kirk Ruths, MD as PCP - General (Internal Medicine) Lucky Cowboy Erskine Squibb, MD as Consulting Physician (Vascular Surgery) Clent Jacks, RN as Registered Nurse Alfonzo Feller, RN as [facility]   Name of the patient: Matthew Yang  [id number]  [birth year]   Date of visit: 06/02/2019   Interval history- Patient sitting up on his bed having lunch. On O2 but appears comfortable. Wife at bedside    No Known Allergies   Past Medical History:  Diagnosis Date  . Diabetes mellitus without complication ([place])   . Esophagus cancer ([place])   . Hepatitis    HEPATITIS C  . Hypercholesteremia   . Hypertension   . Morbid obesity ([place])   . Positive hepatitis C antibody test   . Sleep apnea   . Tubular adenoma of colon    polyp     Past Surgical History:  Procedure Laterality Date  . CHOLECYSTECTOMY    . COLONOSCOPY    . COLONOSCOPY WITH PROPOFOL N/A 05/04/2016   Procedure: COLONOSCOPY WITH PROPOFOL;  Surgeon: Lollie Sails, MD;  Location: [facility] ENDOSCOPY;  Service: Endoscopy;  Laterality: N/A;  . ESOPHAGOGASTRODUODENOSCOPY N/A 05/03/2019   Procedure: ESOPHAGOGASTRODUODENOSCOPY (EGD);  Surgeon: Toledo, Benay Pike, MD;  Location: [facility] ENDOSCOPY;  Service: Gastroenterology;  Laterality: N/A;  . ESOPHAGOGASTRODUODENOSCOPY (EGD) WITH PROPOFOL N/A 08/04/2018   Procedure: ESOPHAGOGASTRODUODENOSCOPY (EGD) WITH PROPOFOL;  Surgeon: Lollie Sails, MD;  Location: [facility] ENDOSCOPY;  Service: Endoscopy;  Laterality: N/A;  . PORTA CATH INSERTION N/A 08/17/2018   Procedure: PORTA CATH INSERTION;  Surgeon: Algernon Huxley, MD;  Location: [facility];  Service: Cardiovascular;  Laterality: N/A;    Social History   Socioeconomic History  . Marital status: Married    Spouse name: Not on file  . Number of children: Not on  file  . Years of education: Not on file  . Highest education level: Not on file  Occupational History  . Occupation: salesman  Tobacco Use  . Smoking status: Never Smoker  . Smokeless tobacco: Never Used  Substance and Sexual Activity  . Alcohol use: Not Currently  . Drug use: No  . Sexual activity: Not on file  Other Topics Concern  . 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    Social Determinants of Health   Financial Resource Strain:   . Difficulty of Paying Living Expenses:   Food Insecurity:   . Worried About Charity fundraiser in the Last Year:   . Arboriculturist in the Last Year:   Transportation Needs:   . Film/video editor (Medical):   Marland Kitchen Lack of Transportation (Non-Medical):   Physical Activity:   . Days of Exercise per Week:   . Minutes of Exercise per Session:   Stress:   . Feeling of Stress :   Social Connections:   . Frequency of Communication with Friends and Family:   . Frequency of Social Gatherings with Friends and Family:   . Attends Religious Services:   . Active Member of Clubs or Organizations:   . Attends Archivist Meetings:   Marland Kitchen Marital Status:   Intimate Partner Violence:   . Fear of Current or Ex-Partner:   . Emotionally Abused:   Marland Kitchen Physically Abused:   . Sexually Abused:  Family History  Family history unknown: Yes     Current Facility-Administered Medications:  .  0.9 %  sodium chloride infusion, , Intravenous, PRN, Lang Snow, NP, Stopped at 05/30/19 0120 .  acetaminophen (TYLENOL) tablet 650 mg, 650 mg, Oral, Q6H PRN **OR** acetaminophen (TYLENOL) suppository 650 mg, 650 mg, Rectal, Q6H PRN, Lavina Hamman, MD .  albuterol (PROVENTIL) (2.5 MG/3ML) 0.083% nebulizer solution 2.5 mg, 2.5 mg, Nebulization, Q2H PRN, Lavina Hamman, MD .  antiseptic oral rinse (BIOTENE) solution 15 mL, 15 mL, Topical, PRN, Lavina Hamman, MD .   bisacodyl (DULCOLAX) suppository 10 mg, 10 mg, Rectal, Daily PRN, Lavina Hamman, MD .  clonazepam Bobbye Charleston) disintegrating tablet 0.125 mg, 0.125 mg, Oral, BID, Lavina Hamman, MD, 0.125 mg at 06/02/19 1006 .  diphenhydrAMINE (BENADRYL) injection 12.5 mg, 12.5 mg, Intravenous, Q4H PRN, Lavina Hamman, MD .  glycopyrrolate (ROBINUL) tablet 1 mg, 1 mg, Oral, Q4H PRN **OR** glycopyrrolate (ROBINUL) injection 0.2 mg, 0.2 mg, Subcutaneous, Q4H PRN **OR** glycopyrrolate (ROBINUL) injection 0.2 mg, 0.2 mg, Intravenous, Q4H PRN, Lavina Hamman, MD .  haloperidol (HALDOL) tablet 2 mg, 2 mg, Oral, Q4H PRN **OR** haloperidol (HALDOL) 2 MG/ML solution 2 mg, 2 mg, Sublingual, Q4H PRN **OR** haloperidol lactate (HALDOL) injection 2 mg, 2 mg, Intravenous, Q4H PRN, Lavina Hamman, MD .  LORazepam (ATIVAN) tablet 1 mg, 1 mg, Oral, Q4H PRN **OR** LORazepam (ATIVAN) 2 MG/ML concentrated solution 1 mg, 1 mg, Sublingual, Q4H PRN **OR** LORazepam (ATIVAN) injection 1 mg, 1 mg, Intravenous, Q4H PRN, Lavina Hamman, MD .  LORazepam (ATIVAN) injection 1 mg, 1 mg, Intravenous, Q4H PRN, Lavina Hamman, MD .  magic mouthwash, 15 mL, Oral, Q6H PRN, Lavina Hamman, MD .  MEDLINE mouth rinse, 15 mL, Mouth Rinse, BID, Lavina Hamman, MD, 15 mL at 06/02/19 1007 .  morphine 2 MG/ML injection 2-4 mg, 2-4 mg, Intravenous, Q2H PRN, Lavina Hamman, MD, 2 mg at 06/02/19 1403 .  morphine CONCENTRATE 10 MG/0.5ML oral solution 5 mg, 5 mg, Oral, Q2H PRN **OR** morphine CONCENTRATE 10 MG/0.5ML oral solution 5 mg, 5 mg, Sublingual, Q2H PRN, Melene Muller, Dreama Saa, NP .  ondansetron (ZOFRAN-ODT) disintegrating tablet 4 mg, 4 mg, Oral, Q6H PRN **OR** ondansetron (ZOFRAN) injection 4 mg, 4 mg, Intravenous, Q6H PRN, Lavina Hamman, MD, 4 mg at 06/02/19 1403 .  oxyCODONE (Oxy IR/ROXICODONE) immediate release tablet 5-10 mg, 5-10 mg, Oral, Q3H PRN, Lavina Hamman, MD .  oxyCODONE (OXYCONTIN) 12 hr tablet 10 mg, 10 mg, Oral, Q12H, Lavina Hamman, MD, 10 mg at 06/02/19 1006 .  polyethylene glycol (MIRALAX / GLYCOLAX) packet 17 g, 17 g, Oral, Daily, Lavina Hamman, MD, 17 g at 06/02/19 1006 .  polyvinyl alcohol (LIQUIFILM TEARS) 1.4 % ophthalmic solution 1 drop, 1 drop, Both Eyes, QID PRN, Lavina Hamman, MD .  senna-docusate (Senokot-S) tablet 1 tablet, 1 tablet, Oral, BID PRN, Lavina Hamman, MD .  simethicone [facility]) chewable tablet 80 mg, 80 mg, Oral, QID, Lavina Hamman, MD, 80 mg at 06/02/19 1410  Current Outpatient Medications:  Marland Kitchen  Multiple Vitamin (MULTIVITAMIN WITH MINERALS) TABS tablet, Take 1 tablet by mouth daily., Disp: , Rfl:  .  pantoprazole (PROTONIX) 40 MG tablet, Take 40 mg by mouth daily., Disp: , Rfl:  .  Turmeric 500 MG CAPS, Take 500 mg by mouth daily., Disp: , Rfl:  .  clonazepam (KLONOPIN) 0.125 MG disintegrating tablet, Take 1 tablet (  0.125 mg total) by mouth 2 (two) times daily., Disp: 20 tablet, Rfl: 0 .  haloperidol (HALDOL) 2 MG tablet, Take 1 tablet (2 mg total) by mouth every 6 (six) hours as needed for agitation (or delirium)., Disp: 20 tablet, Rfl: 0 .  lidocaine-prilocaine (EMLA) cream, Apply 1 application topically as needed., Disp: 30 g, Rfl: 4 .  LORazepam (ATIVAN) 2 MG/ML concentrated solution, Place 0.5 mLs (1 mg total) under the tongue every 4 (four) hours as needed for anxiety., Disp: 30 mL, Rfl: 0 .  Morphine Sulfate (MORPHINE CONCENTRATE) 10 MG/0.5ML SOLN concentrated solution, Take 0.25 mLs (5 mg total) by mouth every 2 (two) hours as needed for moderate pain (or dyspnea)., Disp: 30 mL, Rfl: 0 .  ondansetron (ZOFRAN) 8 MG tablet, Take 1 tablet (8 mg total) by mouth every 8 (eight) hours as needed for nausea or vomiting., Disp: 20 tablet, Rfl: 0 .  oxyCODONE (OXYCONTIN) 10 mg 12 hr tablet, Take 1 tablet (10 mg total) by mouth every 12 (twelve) hours., Disp: 14 tablet, Rfl: 0 .  [START ON Jun 15, 2019] polyethylene glycol (MIRALAX / GLYCOLAX) 17 g packet, Take 17 g by mouth daily., Disp: 14 each,  Rfl: 0 .  prochlorperazine (COMPAZINE) 10 MG tablet, Take 1 tablet (10 mg total) by mouth every 6 (six) hours as needed for nausea or vomiting. (Patient not taking: Reported on 05/28/2019), Disp: 30 tablet, Rfl: 4 .  simethicone (MYLICON) 80 MG chewable tablet, Chew 1 tablet (80 mg total) by mouth 4 (four) times daily., Disp: 30 tablet, Rfl: 0  Physical exam:  Vitals:   06/02/19 0700 06/02/19 0800 06/02/19 0900 06/02/19 1000  BP: (!) 89/54 (!) 95/53 (!) 101/58   Pulse: 77 82 89 98  Resp: 18 (!) '21 19 17  ' Temp:    97.8 F (36.6 C)  TempSrc:    Oral  SpO2:      Weight:      Height:       Patient appears comfortable. Denies any pain.     CMP Latest Ref Rng & Units 06/01/2019  Glucose 70 - 99 mg/dL 127(H)  BUN 6 - 20 mg/dL 16  Creatinine 0.61 - 1.24 mg/dL 0.77  Sodium 135 - 145 mmol/L 133(L)  Potassium 3.5 - 5.1 mmol/L 4.5  Chloride 98 - 111 mmol/L 99  CO2 22 - 32 mmol/L 18(L)  Calcium 8.9 - 10.3 mg/dL 8.8(L)  Total Protein 6.5 - 8.1 g/dL 6.3(L)  Total Bilirubin 0.3 - 1.2 mg/dL 4.0(H)  Alkaline Phos 38 - 126 U/L 299(H)  AST 15 - 41 U/L 349(H)  ALT 0 - 44 U/L 131(H)   CBC Latest Ref Rng & Units 06/01/2019  WBC 4.0 - 10.5 K/uL 9.2  Hemoglobin 13.0 - 17.0 g/dL 11.8(L)  Hematocrit 39.0 - 52.0 % 34.7(L)  Platelets 150 - 400 K/uL 128(L)    '@IMAGES' @  DG Chest 1 View  Result Date: 06/01/2019 CLINICAL DATA:  Hypoxia EXAM: CHEST  1 VIEW COMPARISON:  Four days ago FINDINGS: Porta catheter with tip at the SVC. Interstitial prominence similar to prior. Airway thickening and mild pneumonia seen by recent chest CT. Stable heart size and mediastinal contours; there is thoracic adenopathy by recent chest CT. No effusion or pneumothorax. IMPRESSION: Airway thickening and right lower lobe pneumonia by recent chest CT. No radiographic change since prior. Electronically Signed   By: Monte Fantasia M.D.   On: 06/01/2019 06:56   CT Angio Chest PE W/Cm &/Or Wo Cm  Result Date:  05/28/2019 CLINICAL DATA:  59 year old male with shortness of breath and generalized pain. History of esophageal cancer. EXAM: CT ANGIOGRAPHY CHEST CT ABDOMEN AND PELVIS WITH CONTRAST TECHNIQUE: Multidetector CT imaging of the chest was performed using the standard protocol during bolus administration of intravenous contrast. Multiplanar CT image reconstructions and MIPs were obtained to evaluate the vascular anatomy. Multidetector CT imaging of the abdomen and pelvis was performed using the standard protocol during bolus administration of intravenous contrast. CONTRAST:  70m OMNIPAQUE IOHEXOL 350 MG/ML SOLN COMPARISON:  CT of the chest abdomen pelvis dated 02/19/2019. PET CT dated 05/21/2019. FINDINGS: CTA CHEST FINDINGS Cardiovascular: There is no cardiomegaly or pericardial effusion. Advanced 3 vessel coronary vascular calcification. There is mild atherosclerotic calcification of the thoracic aorta. No aneurysmal dilatation or dissection. Partially visualized Port-A-Cath with tip in the central SVC close to the cavoatrial junction. Evaluation of the pulmonary arteries is very limited due to respiratory motion artifact and suboptimal opacification and timing of the contrast. No large or central pulmonary artery embolus identified. Mediastinum/Nodes: Bilateral hilar and mediastinal adenopathy new since the CT of 02/19/2019. Right hilar lymph nodes measure up to 2.3 cm in short axis. Subcarinal adenopathy measures 2.5 cm in short axis. The esophagus is grossly unremarkable but suboptimally visualized due to mediastinal adenopathy. No mediastinal fluid collection. Lungs/Pleura: Faint area of opacity at the right lung base similar to the PET-CT of 05/21/2019 most consistent with pneumonia. Clinical correlation and follow-up recommended. There is diffuse interstitial and interlobular septal thickening of the right lung with mild nodularity most likely infectious or inflammatory in etiology. There is probably mild  associated or reactive interstitial edema in the right lung. The left lung is clear. There is no pleural effusion or pneumothorax. The central airways are patent. Musculoskeletal: Partially visualized right supraclavicular lymph node measures approximately 14 mm (series 4, image 1). Mildly rounded lymph node in the posterior neck deep to the right thyroid lobe (series 4, image 4) measures approximately 12 mm. No definite axillary adenopathy. Mild degenerative changes of the spine. No acute osseous pathology. Review of the MIP images confirms the above findings. CT ABDOMEN and PELVIS FINDINGS No intra-abdominal free air. Small ascites. Hepatobiliary: Cirrhosis. Innumerable hepatic hypodense lesions, new since the CT of 02/19/2019 consistent with metastatic disease. The largest lesion or confluent of lesions in the right lobe of the liver measures approximately 6.7 x 7.7 cm. No intrahepatic biliary ductal dilatation. Cholecystectomy. Pancreas: The pancreas is unremarkable as visualized. Spleen: Normal in size without focal abnormality. Adrenals/Urinary Tract: The adrenal glands are unremarkable. There is no hydronephrosis on either side. There is symmetric enhancement and excretion of contrast by both kidneys. The visualized ureters and urinary bladder appear unremarkable. Stomach/Bowel: There is no bowel obstruction or active inflammation. The appendix is normal. Vascular/Lymphatic: Mild aortoiliac atherosclerotic disease. The IVC is unremarkable. No portal venous gas. Retroperitoneal and porta hepatic adenopathy, new since the prior CT of 02/19/2019. For example an enlarged periportal lymph node measures 2 cm in short axis. Retrocaval lymph node measures 2.1 cm in short axis. Reproductive: The prostate and seminal vesicles are grossly unremarkable. Other: None Musculoskeletal: Multilevel degenerative changes of the spine with disc desiccation and vacuum phenomena. No acute osseous pathology. Review of the MIP images  confirms the above findings. IMPRESSION: 1. Findings most consistent with right lower lobe pneumonia and associated reactive mild interstitial edema. Clinical correlation and follow-up to resolution recommended. 2. Interval development of significant thoracic, and retroperitoneal adenopathy as well as extensive hepatic metastatic disease since  the CT of 02/19/2019. 3. Cirrhosis with small ascites. 4. Aortic Atherosclerosis (ICD10-I70.0). Electronically Signed   By: Anner Crete M.D.   On: 05/28/2019 18:00   CT ABDOMEN PELVIS W CONTRAST  Result Date: 05/28/2019 CLINICAL DATA:  59 year old male with shortness of breath and generalized pain. History of esophageal cancer. EXAM: CT ANGIOGRAPHY CHEST CT ABDOMEN AND PELVIS WITH CONTRAST TECHNIQUE: Multidetector CT imaging of the chest was performed using the standard protocol during bolus administration of intravenous contrast. Multiplanar CT image reconstructions and MIPs were obtained to evaluate the vascular anatomy. Multidetector CT imaging of the abdomen and pelvis was performed using the standard protocol during bolus administration of intravenous contrast. CONTRAST:  33m OMNIPAQUE IOHEXOL 350 MG/ML SOLN COMPARISON:  CT of the chest abdomen pelvis dated 02/19/2019. PET CT dated 05/21/2019. FINDINGS: CTA CHEST FINDINGS Cardiovascular: There is no cardiomegaly or pericardial effusion. Advanced 3 vessel coronary vascular calcification. There is mild atherosclerotic calcification of the thoracic aorta. No aneurysmal dilatation or dissection. Partially visualized Port-A-Cath with tip in the central SVC close to the cavoatrial junction. Evaluation of the pulmonary arteries is very limited due to respiratory motion artifact and suboptimal opacification and timing of the contrast. No large or central pulmonary artery embolus identified. Mediastinum/Nodes: Bilateral hilar and mediastinal adenopathy new since the CT of 02/19/2019. Right hilar lymph nodes measure up to  2.3 cm in short axis. Subcarinal adenopathy measures 2.5 cm in short axis. The esophagus is grossly unremarkable but suboptimally visualized due to mediastinal adenopathy. No mediastinal fluid collection. Lungs/Pleura: Faint area of opacity at the right lung base similar to the PET-CT of 05/21/2019 most consistent with pneumonia. Clinical correlation and follow-up recommended. There is diffuse interstitial and interlobular septal thickening of the right lung with mild nodularity most likely infectious or inflammatory in etiology. There is probably mild associated or reactive interstitial edema in the right lung. The left lung is clear. There is no pleural effusion or pneumothorax. The central airways are patent. Musculoskeletal: Partially visualized right supraclavicular lymph node measures approximately 14 mm (series 4, image 1). Mildly rounded lymph node in the posterior neck deep to the right thyroid lobe (series 4, image 4) measures approximately 12 mm. No definite axillary adenopathy. Mild degenerative changes of the spine. No acute osseous pathology. Review of the MIP images confirms the above findings. CT ABDOMEN and PELVIS FINDINGS No intra-abdominal free air. Small ascites. Hepatobiliary: Cirrhosis. Innumerable hepatic hypodense lesions, new since the CT of 02/19/2019 consistent with metastatic disease. The largest lesion or confluent of lesions in the right lobe of the liver measures approximately 6.7 x 7.7 cm. No intrahepatic biliary ductal dilatation. Cholecystectomy. Pancreas: The pancreas is unremarkable as visualized. Spleen: Normal in size without focal abnormality. Adrenals/Urinary Tract: The adrenal glands are unremarkable. There is no hydronephrosis on either side. There is symmetric enhancement and excretion of contrast by both kidneys. The visualized ureters and urinary bladder appear unremarkable. Stomach/Bowel: There is no bowel obstruction or active inflammation. The appendix is normal.  Vascular/Lymphatic: Mild aortoiliac atherosclerotic disease. The IVC is unremarkable. No portal venous gas. Retroperitoneal and porta hepatic adenopathy, new since the prior CT of 02/19/2019. For example an enlarged periportal lymph node measures 2 cm in short axis. Retrocaval lymph node measures 2.1 cm in short axis. Reproductive: The prostate and seminal vesicles are grossly unremarkable. Other: None Musculoskeletal: Multilevel degenerative changes of the spine with disc desiccation and vacuum phenomena. No acute osseous pathology. Review of the MIP images confirms the above findings. IMPRESSION: 1. Findings  most consistent with right lower lobe pneumonia and associated reactive mild interstitial edema. Clinical correlation and follow-up to resolution recommended. 2. Interval development of significant thoracic, and retroperitoneal adenopathy as well as extensive hepatic metastatic disease since the CT of 02/19/2019. 3. Cirrhosis with small ascites. 4. Aortic Atherosclerosis (ICD10-I70.0). Electronically Signed   By: Anner Crete M.D.   On: 05/28/2019 18:00   NM PET Image Restag (PS) Skull Base To Thigh  Result Date: 05/21/2019 CLINICAL DATA:  Subsequent treatment strategy for distal esophageal cancer. EXAM: NUCLEAR MEDICINE PET SKULL BASE TO THIGH TECHNIQUE: 15.9 mCi F-18 FDG was injected intravenously. Full-ring PET imaging was performed from the skull base to thigh after the radiotracer. CT data was obtained and used for attenuation correction and anatomic localization. Fasting blood glucose: 91 mg/dl COMPARISON:  CT abdomen/pelvis dated 03/01/2019. PET-CT dated 11/27/2018. FINDINGS: Mediastinal blood pool activity: SUV max 3.4 Liver activity: SUV max NA NECK: No hypermetabolic cervical lymphadenopathy. Focal hypermetabolism within the left pterygoid muscle, max SUV 5.6, although without CT abnormality. This appearance is indeterminate but of questionable clinical significance given the additional  findings described below. Incidental CT findings: none CHEST: Patchy/nodular opacity in the posterior right lower lobe (series 3/image 136), max SUV 10.8. This may reflect mild infection/pneumonia, although tumor is not excluded given additional findings. Widespread thoracic nodal metastases, including: --14 mm short axis right supraclavicular node (series 3/image 68), max SUV 13.1 --14 mm short axis right paratracheal node (series 3/image 89), max SUV 14.3 --23 mm short axis subcarinal node (series 3/image 107), max SUV 19.0 --Approximate 24 mm short axis right hilar node (series 3/image 103), max SUV 15.9 --Approximate 17 mm short axis left hilar node (series 3/image 106), max SUV 10.7 No associated hypermetabolism involving the lower esophagus at the GE junction. No associated soft Jewel mass on CT. Incidental CT findings: Right chest port terminates at the cavoatrial junction. Atherosclerotic calcifications of the aortic arch. 3 vessel coronary atherosclerosis. ABDOMEN/PELVIS: Cirrhosis. Although poorly evaluated on unenhanced CT, there is suspected multifocal hepatic metastases with a heterogeneous dominant mass in the posterior right hepatic lobe, max SUV 18.8. Pancreas is poorly evaluated but there is no definite pancreatic mass, atrophy, or ductal dilatation. No abnormal hypermetabolism in the spleen or adrenal glands. Widespread abdominopelvic nodal metastases, including: --22 mm short axis peripancreatic node in the porta hepatis (series 2/image 162), max SUV 26.5 --27 mm short axis portacaval node (series 3/image 170), max SUV 24.1 --15 mm short axis left para-aortic node (series 3/image 137), max SUV 20.4 --20 mm short axis right retrocaval node (series 3/image 190), max SUV 24.4 Incidental CT findings: Prior cholecystectomy. Mild atherosclerotic calcifications the abdominal aorta and branch vessels. Dystrophic calcifications involving the prostate. SKELETON: Widespread osseous metastases throughout the  visualized axial and appendicular skeleton, including: --Right C1 pedicle, max SUV 7.3 --Sternum, max SUV 7.6 --Left humeral head, max SUV 14.6 --Left lateral 4th rib, max SUV 5.7 --Right T12 vertebral body, max SUV 21.9 ---Left L2 vertebral body, max SUV 17.3 --Left sacrum, max SUV 12.0 --Right iliac bone, max SUV 12.8 --Right anterior process the acetabulum, max SUV 13.2 --Left proximal femur, max SUV 5.9 No evidence of pathologic fracture. Incidental CT findings: Degenerative changes of the visualized thoracolumbar spine. IMPRESSION: No evidence of residual lower esophageal mass or hypermetabolism. Widespread thoracic, abdominal, and retroperitoneal nodal metastases, as above. Widespread osseous metastases, as above. Suspected multifocal hepatic metastases, including a dominant mass in the right hepatic lobe, poorly visualized/evaluated. Underlying cirrhosis. Patchy right lower lobe  opacity, favoring pneumonia, tumor not excluded. Technically speaking, while this is presumed to reflect recurrence of the patient's known esophageal cancer, that is not confirmed. Regardless, the patient's right supraclavicular nodes would be amenable to excision/sampling if needed. Additional ancillary findings as above. Electronically Signed   By: Julian Hy M.D.   On: 05/21/2019 13:45   DG Chest Port 1 View  Result Date: 05/28/2019 CLINICAL DATA:  Generalized aches with shortness of breath. EXAM: PORTABLE CHEST 1 VIEW COMPARISON:  April 27, 2010 FINDINGS: A right-sided venous Port-A-Cath is seen with its distal tip overlying the distal aspect of the superior vena cava. Mild atelectasis and/or infiltrate is seen within the right lung base. There is no evidence of a pleural effusion or pneumothorax. The heart size and mediastinal contours are within normal limits. Degenerative changes seen throughout the thoracic spine. Prominent small bowel loops are suspected within the visualized portion of the upper abdomen.  IMPRESSION: 1. Mild atelectasis and/or infiltrate within the right lung base. 2. Prominent small bowel loops within the upper abdomen. Sequelae associated with a partial small bowel obstruction versus ileus cannot be excluded. Correlation with abdominal plain films is recommended. Electronically Signed   By: Virgina Norfolk M.D.   On: 05/28/2019 17:08     Assessment and plan- Patient is a 59 y.o. male with metastatic esophageal cancer admitted for acute hypoxic respiratory failure  Met with patient and wife at bedside. He appears comfortable. They are on board with the plan to go home with hospice. They know to call us if they have any concerns over the weekend.    Visit Diagnosis 1. Community acquired pneumonia of right lower lobe of lung   2. Anxiety state   3. Tachypnea   4. Hypoxia      Dr. Randa Evens, MD, MPH [facility] [phone] 06/02/2019 8:26 PM

## 2019-06-02 NOTE — Discharge Summary (Signed)
[facility] Discharge Summary   Patient: Matthew Yang [id number]  PCP: Kirk Ruths, MD  Date of admission: 05/28/2019   Date of discharge: 06/02/2019      Discharge Diagnoses:  Principal diagnosis Stage 4 esophageal cancer   Active Problems:   CAP (community acquired pneumonia)   Anxiety state   Palliative care encounter   Admitted From: home Disposition:  Home with hospice  Recommendations for Outpatient Follow-up:  1. PCP: establish care with hospice 2. Follow up LABS/TEST:  none  Follow-up Information    Borders, Kirt Boys, NP Follow up.   Specialty: Hospice and Palliative Medicine Contact information: [place] [address] [phone]        Cammie Sickle, MD Follow up.   Specialties: Internal Medicine, Oncology Contact information: [place] [address] [phone]          Diet recommendation: Regular diet  Activity: The patient is advised to gradually reintroduce usual activities, as tolerated  Discharge Condition: stable  Code Status: DNR   History of present illness: As per the H and P dictated on admission, "Matthew Yang  is a 59 y.o. obese Caucasian male with a known history of stage IV metastatic esophageal cancer, type diabetes mellitus, hypertension and dyslipidemia, who presented to the emergency room with acute onset of worsening dyspnea with associated cough productive of whitish sputum and occasional wheezing over the last couple days.  He admitted to chills but did not have any measured fever.  He has been having palpitations.  No nausea or vomiting or diarrhea.  He has occasional abdominal pain which is chronic.  No dysuria, oliguria or hematuria or flank pain.  No chest pain or or hemoptysis.  No rhinorrhea or nasal congestion or sore throat.  Upon presentation to the emergency room, vital signs were within normal.  Pulse oximetry was 92% on room air later on in then 95% on 2 L  of O2 by nasal cannula.  His blood pressure is also dropped to 76/57 with a heart rate of 130 and respiratory to 22.  That came up with hydration to 106/68 with a pulse of 109 and respiratory rate is been ranging from 19-28.  Labs revealed elevated alkaline phosphatase and AST ALT with total bili of 2.  BNP was 24 and high-sensitivity troponin was 56.  Lactic acid was 3.7 and CBC was unremarkable.  Influenza antigens in COVID-19 PCR came back negative.  Portable chest ray showed mild atelectasis and/or infiltrate in the right lung base and prominent small bowel loops with inability to exclude ileus versus partial small bowel obstruction.  Chest and abdominal CT scan revealed findings consistent with right lower lobe pneumonia and associated with reactive mild interstitial edema and interval development of thoracic and retroperitoneal significant adenopathy and extensive hepatic metastasis since 02/19/2019.  He also showed cirrhosis with small ascites and aortic atherosclerosis.EKG showed sinus tachycardia with rate 134.  The patient was given IV Rocephin and Zithromax and 3 L bolus of IV normal saline.  He will be admitted to a medical monitored bed for further evaluation and management"  Hospital Course:  Patient presented with complaints of shortness of breath.  Found to have community-acquired pneumonia. Patient was being treated with IV antibiotics.  At the same time patient continues to have worsening abdominal pain from his liver capsule stretching from his metastatic stage IV esophageal cancer progressively making patient is breathing heavier and heavier along with the pneumonia.  Patient was  transferred to stepdown unit.  Family decided transition to care to comfort and hospice at home. Summary of his active problems in the hospital is as following.   1.  Acute hypoxic respiratory failure. History of OSA. Community-acquired pneumonia. Currently on 5 L of oxygen. ABG shows evidence of  hyperventilation with respiratory alkalosis. Patient does have significant work of breathing likely from his pain and anxiety. Continue oxygen continue BiPAP and CPAP. Patient remains at high risk for intubation. Appreciate PCCM input.  2.  Stage IV esophageal cancer. Liver metastasis. Cancer-related pain Likely the primary driver for patient's current ongoing symptoms and lack of improvement. CT PE protocol at the time of the admission was negative for pulmonary embolism. CT abdomen shows evidence of progress of his cancer. Patient does have significant abdominal pain. We will add scheduled pain medication as well as encourage patient to use as needed medication. Palliative care consulted for goals of care discussion appreciate assistance currently DNR/DNI.  3.  Sinus tachycardia Likely in response to patient's critical illness. Monitor.  4.  Lactic acidosis Likely coming from liver injury from his ongoing cancer. Now on comfort care..  5.  Community-acquired pneumonia Blood cultures are negative. Work-up otherwise is also negative. Chest x-ray performed on 06/01/2019 does not show any evidence of new pneumonia. Treated with antibiotics.  Now comfort care on discharge.  6.  Transaminitis. Secondary to liver metastasis. Progressively worsening. Likely driving patient's current presentation. Bilirubin is also worsening. Can also contribute to encephalopathy  7.  Morbid obesity. OSA. Continue CPAP. Body mass index is 42.23 kg/m.   8.  Acute encephalopathy. Overnight patient was confused and agitated requiring Precedex infusion.  Now improving in the morning.  Haldol on discharge.  Pain control  - [employer] Controlled Substance Reporting System database was reviewed. -Patient is a hospice patient.   On the day of the discharge the patient's vitals were stable, and no other acute medical condition were reported by patient. the patient was felt safe to be  discharge at Home with hospice.  Consultants: PCCM PALLIATIVE CARE Oncology  Procedures: NONE  Discharge Exam: General: Appear in mild distress, no Rash; Oral Mucosa Clear, moist. Cardiovascular: S1 and S2 Present, no Murmur, Respiratory: normal respiratory effort, Bilateral Air entry present and Occasional  Crackles, no wheezes Abdomen: Bowel Sound present, Soft and mild tenderness, no hernia Extremities: no Pedal edema, no calf tenderness Neurology: alert and oriented to time, place, and person affect appropriate.  Filed Weights   05/28/19 1612 05/29/19 1546 06/01/19 1055  Weight: 132.9 kg 132.9 kg 133.5 kg   Vitals:   06/02/19 0900 06/02/19 1000  BP: (!) 101/58   Pulse: 89 98  Resp: 19 17  Temp:  97.8 F (36.6 C)  SpO2:      DISCHARGE MEDICATION: Allergies as of 06/02/2019   No Known Allergies     Medication List    STOP taking these medications   atorvastatin 20 MG tablet Commonly known as: LIPITOR   doxycycline 100 MG capsule Commonly known as: VIBRAMYCIN   Fish Oil 1000 MG Caps   lisinopril 20 MG tablet Commonly known as: ZESTRIL     TAKE these medications   clonazepam 0.125 MG disintegrating tablet Commonly known as: KLONOPIN Take 1 tablet (0.125 mg total) by mouth 2 (two) times daily.   haloperidol 2 MG tablet Commonly known as: HALDOL Take 1 tablet (2 mg total) by mouth every 6 (six) hours as needed for agitation (or delirium).   lidocaine-prilocaine cream  Commonly known as: EMLA Apply 1 application topically as needed.   LORazepam 2 MG/ML concentrated solution Commonly known as: ATIVAN Place 0.5 mLs (1 mg total) under the tongue every 4 (four) hours as needed for anxiety.   morphine CONCENTRATE 10 MG/0.5ML Soln concentrated solution Take 0.25 mLs (5 mg total) by mouth every 2 (two) hours as needed for moderate pain (or dyspnea).   multivitamin with minerals Tabs tablet Take 1 tablet by mouth daily.   ondansetron 8 MG tablet Commonly  known as: ZOFRAN Take 1 tablet (8 mg total) by mouth every 8 (eight) hours as needed for nausea or vomiting.   oxyCODONE 10 mg 12 hr tablet Commonly known as: OXYCONTIN Take 1 tablet (10 mg total) by mouth every 12 (twelve) hours.   pantoprazole 40 MG tablet Commonly known as: PROTONIX Take 40 mg by mouth daily.   polyethylene glycol 17 g packet Commonly known as: MIRALAX / GLYCOLAX Take 17 g by mouth daily. Start taking on: 06/11/2019   prochlorperazine 10 MG tablet Commonly known as: COMPAZINE Take 1 tablet (10 mg total) by mouth every 6 (six) hours as needed for nausea or vomiting.   simethicone 80 MG chewable tablet Commonly known as: MYLICON Chew 1 tablet (80 mg total) by mouth 4 (four) times daily.   Turmeric 500 MG Caps Take 500 mg by mouth daily.            Durable Medical Equipment  (From admission, onward)         Start     Ordered   06/02/19 0935  For home use only DME Hospital bed  Once    Question Answer Comment  Length of Need Lifetime   Patient has (list medical condition): hospice patient   The above medical condition requires: Patient requires the ability to reposition frequently   Head must be elevated greater than: 30 degrees   Bed type Semi-electric   Support Surface: Gel Overlay      06/02/19 0935   06/02/19 0857  For home use only DME Bedside commode  Once    Question:  Patient needs a bedside commode to treat with the following condition  Answer:  Hospice care patient   06/02/19 0859         No Known Allergies Discharge Instructions    Diet general   Complete by: As directed    Increase activity slowly   Complete by: As directed       The results of significant diagnostics from this hospitalization (including imaging, microbiology, ancillary and laboratory) are listed below for reference.    Significant Diagnostic Studies: DG Chest 1 View  Result Date: 06/01/2019 CLINICAL DATA:  Hypoxia EXAM: CHEST  1 VIEW COMPARISON:   Four days ago FINDINGS: Porta catheter with tip at the SVC. Interstitial prominence similar to prior. Airway thickening and mild pneumonia seen by recent chest CT. Stable heart size and mediastinal contours; there is thoracic adenopathy by recent chest CT. No effusion or pneumothorax. IMPRESSION: Airway thickening and right lower lobe pneumonia by recent chest CT. No radiographic change since prior. Electronically Signed   By: Monte Fantasia M.D.   On: 06/01/2019 06:56   CT Angio Chest PE W/Cm &/Or Wo Cm  Result Date: 05/28/2019 CLINICAL DATA:  59 year old male with shortness of breath and generalized pain. History of esophageal cancer. EXAM: CT ANGIOGRAPHY CHEST CT ABDOMEN AND PELVIS WITH CONTRAST TECHNIQUE: Multidetector CT imaging of the chest was performed using the standard protocol during  bolus administration of intravenous contrast. Multiplanar CT image reconstructions and MIPs were obtained to evaluate the vascular anatomy. Multidetector CT imaging of the abdomen and pelvis was performed using the standard protocol during bolus administration of intravenous contrast. CONTRAST:  96mL OMNIPAQUE IOHEXOL 350 MG/ML SOLN COMPARISON:  CT of the chest abdomen pelvis dated 02/19/2019. PET CT dated 05/21/2019. FINDINGS: CTA CHEST FINDINGS Cardiovascular: There is no cardiomegaly or pericardial effusion. Advanced 3 vessel coronary vascular calcification. There is mild atherosclerotic calcification of the thoracic aorta. No aneurysmal dilatation or dissection. Partially visualized Port-A-Cath with tip in the central SVC close to the cavoatrial junction. Evaluation of the pulmonary arteries is very limited due to respiratory motion artifact and suboptimal opacification and timing of the contrast. No large or central pulmonary artery embolus identified. Mediastinum/Nodes: Bilateral hilar and mediastinal adenopathy new since the CT of 02/19/2019. Right hilar lymph nodes measure up to 2.3 cm in short axis. Subcarinal  adenopathy measures 2.5 cm in short axis. The esophagus is grossly unremarkable but suboptimally visualized due to mediastinal adenopathy. No mediastinal fluid collection. Lungs/Pleura: Faint area of opacity at the right lung base similar to the PET-CT of 05/21/2019 most consistent with pneumonia. Clinical correlation and follow-up recommended. There is diffuse interstitial and interlobular septal thickening of the right lung with mild nodularity most likely infectious or inflammatory in etiology. There is probably mild associated or reactive interstitial edema in the right lung. The left lung is clear. There is no pleural effusion or pneumothorax. The central airways are patent. Musculoskeletal: Partially visualized right supraclavicular lymph node measures approximately 14 mm (series 4, image 1). Mildly rounded lymph node in the posterior neck deep to the right thyroid lobe (series 4, image 4) measures approximately 12 mm. No definite axillary adenopathy. Mild degenerative changes of the spine. No acute osseous pathology. Review of the MIP images confirms the above findings. CT ABDOMEN and PELVIS FINDINGS No intra-abdominal free air. Small ascites. Hepatobiliary: Cirrhosis. Innumerable hepatic hypodense lesions, new since the CT of 02/19/2019 consistent with metastatic disease. The largest lesion or confluent of lesions in the right lobe of the liver measures approximately 6.7 x 7.7 cm. No intrahepatic biliary ductal dilatation. Cholecystectomy. Pancreas: The pancreas is unremarkable as visualized. Spleen: Normal in size without focal abnormality. Adrenals/Urinary Tract: The adrenal glands are unremarkable. There is no hydronephrosis on either side. There is symmetric enhancement and excretion of contrast by both kidneys. The visualized ureters and urinary bladder appear unremarkable. Stomach/Bowel: There is no bowel obstruction or active inflammation. The appendix is normal. Vascular/Lymphatic: Mild aortoiliac  atherosclerotic disease. The IVC is unremarkable. No portal venous gas. Retroperitoneal and porta hepatic adenopathy, new since the prior CT of 02/19/2019. For example an enlarged periportal lymph node measures 2 cm in short axis. Retrocaval lymph node measures 2.1 cm in short axis. Reproductive: The prostate and seminal vesicles are grossly unremarkable. Other: None Musculoskeletal: Multilevel degenerative changes of the spine with disc desiccation and vacuum phenomena. No acute osseous pathology. Review of the MIP images confirms the above findings. IMPRESSION: 1. Findings most consistent with right lower lobe pneumonia and associated reactive mild interstitial edema. Clinical correlation and follow-up to resolution recommended. 2. Interval development of significant thoracic, and retroperitoneal adenopathy as well as extensive hepatic metastatic disease since the CT of 02/19/2019. 3. Cirrhosis with small ascites. 4. Aortic Atherosclerosis (ICD10-I70.0). Electronically Signed   By: Anner Crete M.D.   On: 05/28/2019 18:00   CT ABDOMEN PELVIS W CONTRAST  Result Date: 05/28/2019 CLINICAL DATA:  59 year old male with shortness of breath and generalized pain. History of esophageal cancer. EXAM: CT ANGIOGRAPHY CHEST CT ABDOMEN AND PELVIS WITH CONTRAST TECHNIQUE: Multidetector CT imaging of the chest was performed using the standard protocol during bolus administration of intravenous contrast. Multiplanar CT image reconstructions and MIPs were obtained to evaluate the vascular anatomy. Multidetector CT imaging of the abdomen and pelvis was performed using the standard protocol during bolus administration of intravenous contrast. CONTRAST:  47mL OMNIPAQUE IOHEXOL 350 MG/ML SOLN COMPARISON:  CT of the chest abdomen pelvis dated 02/19/2019. PET CT dated 05/21/2019. FINDINGS: CTA CHEST FINDINGS Cardiovascular: There is no cardiomegaly or pericardial effusion. Advanced 3 vessel coronary vascular calcification. There  is mild atherosclerotic calcification of the thoracic aorta. No aneurysmal dilatation or dissection. Partially visualized Port-A-Cath with tip in the central SVC close to the cavoatrial junction. Evaluation of the pulmonary arteries is very limited due to respiratory motion artifact and suboptimal opacification and timing of the contrast. No large or central pulmonary artery embolus identified. Mediastinum/Nodes: Bilateral hilar and mediastinal adenopathy new since the CT of 02/19/2019. Right hilar lymph nodes measure up to 2.3 cm in short axis. Subcarinal adenopathy measures 2.5 cm in short axis. The esophagus is grossly unremarkable but suboptimally visualized due to mediastinal adenopathy. No mediastinal fluid collection. Lungs/Pleura: Faint area of opacity at the right lung base similar to the PET-CT of 05/21/2019 most consistent with pneumonia. Clinical correlation and follow-up recommended. There is diffuse interstitial and interlobular septal thickening of the right lung with mild nodularity most likely infectious or inflammatory in etiology. There is probably mild associated or reactive interstitial edema in the right lung. The left lung is clear. There is no pleural effusion or pneumothorax. The central airways are patent. Musculoskeletal: Partially visualized right supraclavicular lymph node measures approximately 14 mm (series 4, image 1). Mildly rounded lymph node in the posterior neck deep to the right thyroid lobe (series 4, image 4) measures approximately 12 mm. No definite axillary adenopathy. Mild degenerative changes of the spine. No acute osseous pathology. Review of the MIP images confirms the above findings. CT ABDOMEN and PELVIS FINDINGS No intra-abdominal free air. Small ascites. Hepatobiliary: Cirrhosis. Innumerable hepatic hypodense lesions, new since the CT of 02/19/2019 consistent with metastatic disease. The largest lesion or confluent of lesions in the right lobe of the liver measures  approximately 6.7 x 7.7 cm. No intrahepatic biliary ductal dilatation. Cholecystectomy. Pancreas: The pancreas is unremarkable as visualized. Spleen: Normal in size without focal abnormality. Adrenals/Urinary Tract: The adrenal glands are unremarkable. There is no hydronephrosis on either side. There is symmetric enhancement and excretion of contrast by both kidneys. The visualized ureters and urinary bladder appear unremarkable. Stomach/Bowel: There is no bowel obstruction or active inflammation. The appendix is normal. Vascular/Lymphatic: Mild aortoiliac atherosclerotic disease. The IVC is unremarkable. No portal venous gas. Retroperitoneal and porta hepatic adenopathy, new since the prior CT of 02/19/2019. For example an enlarged periportal lymph node measures 2 cm in short axis. Retrocaval lymph node measures 2.1 cm in short axis. Reproductive: The prostate and seminal vesicles are grossly unremarkable. Other: None Musculoskeletal: Multilevel degenerative changes of the spine with disc desiccation and vacuum phenomena. No acute osseous pathology. Review of the MIP images confirms the above findings. IMPRESSION: 1. Findings most consistent with right lower lobe pneumonia and associated reactive mild interstitial edema. Clinical correlation and follow-up to resolution recommended. 2. Interval development of significant thoracic, and retroperitoneal adenopathy as well as extensive hepatic metastatic disease since the CT of 02/19/2019.  3. Cirrhosis with small ascites. 4. Aortic Atherosclerosis (ICD10-I70.0). Electronically Signed   By: Anner Crete M.D.   On: 05/28/2019 18:00   NM PET Image Restag (PS) Skull Base To Thigh  Result Date: 05/21/2019 CLINICAL DATA:  Subsequent treatment strategy for distal esophageal cancer. EXAM: NUCLEAR MEDICINE PET SKULL BASE TO THIGH TECHNIQUE: 15.9 mCi F-18 FDG was injected intravenously. Full-ring PET imaging was performed from the skull base to thigh after the  radiotracer. CT data was obtained and used for attenuation correction and anatomic localization. Fasting blood glucose: 91 mg/dl COMPARISON:  CT abdomen/pelvis dated 03/01/2019. PET-CT dated 11/27/2018. FINDINGS: Mediastinal blood pool activity: SUV max 3.4 Liver activity: SUV max NA NECK: No hypermetabolic cervical lymphadenopathy. Focal hypermetabolism within the left pterygoid muscle, max SUV 5.6, although without CT abnormality. This appearance is indeterminate but of questionable clinical significance given the additional findings described below. Incidental CT findings: none CHEST: Patchy/nodular opacity in the posterior right lower lobe (series 3/image 136), max SUV 10.8. This may reflect mild infection/pneumonia, although tumor is not excluded given additional findings. Widespread thoracic nodal metastases, including: --14 mm short axis right supraclavicular node (series 3/image 68), max SUV 13.1 --14 mm short axis right paratracheal node (series 3/image 89), max SUV 14.3 --23 mm short axis subcarinal node (series 3/image 107), max SUV 19.0 --Approximate 24 mm short axis right hilar node (series 3/image 103), max SUV 15.9 --Approximate 17 mm short axis left hilar node (series 3/image 106), max SUV 10.7 No associated hypermetabolism involving the lower esophagus at the GE junction. No associated soft Jewel mass on CT. Incidental CT findings: Right chest port terminates at the cavoatrial junction. Atherosclerotic calcifications of the aortic arch. 3 vessel coronary atherosclerosis. ABDOMEN/PELVIS: Cirrhosis. Although poorly evaluated on unenhanced CT, there is suspected multifocal hepatic metastases with a heterogeneous dominant mass in the posterior right hepatic lobe, max SUV 18.8. Pancreas is poorly evaluated but there is no definite pancreatic mass, atrophy, or ductal dilatation. No abnormal hypermetabolism in the spleen or adrenal glands. Widespread abdominopelvic nodal metastases, including: --22 mm  short axis peripancreatic node in the porta hepatis (series 2/image 162), max SUV 26.5 --27 mm short axis portacaval node (series 3/image 170), max SUV 24.1 --15 mm short axis left para-aortic node (series 3/image 137), max SUV 20.4 --20 mm short axis right retrocaval node (series 3/image 190), max SUV 24.4 Incidental CT findings: Prior cholecystectomy. Mild atherosclerotic calcifications the abdominal aorta and branch vessels. Dystrophic calcifications involving the prostate. SKELETON: Widespread osseous metastases throughout the visualized axial and appendicular skeleton, including: --Right C1 pedicle, max SUV 7.3 --Sternum, max SUV 7.6 --Left humeral head, max SUV 14.6 --Left lateral 4th rib, max SUV 5.7 --Right T12 vertebral body, max SUV 21.9 ---Left L2 vertebral body, max SUV 17.3 --Left sacrum, max SUV 12.0 --Right iliac bone, max SUV 12.8 --Right anterior process the acetabulum, max SUV 13.2 --Left proximal femur, max SUV 5.9 No evidence of pathologic fracture. Incidental CT findings: Degenerative changes of the visualized thoracolumbar spine. IMPRESSION: No evidence of residual lower esophageal mass or hypermetabolism. Widespread thoracic, abdominal, and retroperitoneal nodal metastases, as above. Widespread osseous metastases, as above. Suspected multifocal hepatic metastases, including a dominant mass in the right hepatic lobe, poorly visualized/evaluated. Underlying cirrhosis. Patchy right lower lobe opacity, favoring pneumonia, tumor not excluded. Technically speaking, while this is presumed to reflect recurrence of the patient's known esophageal cancer, that is not confirmed. Regardless, the patient's right supraclavicular nodes would be amenable to excision/sampling if needed. Additional ancillary findings  as above. Electronically Signed   By: Julian Hy M.D.   On: 05/21/2019 13:45   DG Chest Port 1 View  Result Date: 05/28/2019 CLINICAL DATA:  Generalized aches with shortness of breath.  EXAM: PORTABLE CHEST 1 VIEW COMPARISON:  April 27, 2010 FINDINGS: A right-sided venous Port-A-Cath is seen with its distal tip overlying the distal aspect of the superior vena cava. Mild atelectasis and/or infiltrate is seen within the right lung base. There is no evidence of a pleural effusion or pneumothorax. The heart size and mediastinal contours are within normal limits. Degenerative changes seen throughout the thoracic spine. Prominent small bowel loops are suspected within the visualized portion of the upper abdomen. IMPRESSION: 1. Mild atelectasis and/or infiltrate within the right lung base. 2. Prominent small bowel loops within the upper abdomen. Sequelae associated with a partial small bowel obstruction versus ileus cannot be excluded. Correlation with abdominal plain films is recommended. Electronically Signed   By: Virgina Norfolk M.D.   On: 05/28/2019 17:08    Microbiology: Recent Results (from the past 240 hour(s))  Respiratory Panel by RT PCR (Flu A&B, Covid) - Nasopharyngeal Swab     Status: None   Collection Time: 05/28/19  4:44 PM   Specimen: Nasopharyngeal Swab  Result Value Ref Range Status   SARS Coronavirus 2 by RT PCR NEGATIVE NEGATIVE Final    Comment: (NOTE) SARS-CoV-2 target nucleic acids are NOT DETECTED. The SARS-CoV-2 RNA is generally detectable in upper respiratoy specimens during the acute phase of infection. The lowest concentration of SARS-CoV-2 viral copies this assay can detect is 131 copies/mL. A negative result does not preclude SARS-Cov-2 infection and should not be used as the sole basis for treatment or other patient management decisions. A negative result may occur with  improper specimen collection/handling, submission of specimen other than nasopharyngeal swab, presence of viral mutation(s) within the areas targeted by this assay, and inadequate number of viral copies (<131 copies/mL). A negative result must be combined with  clinical observations, patient history, and epidemiological information. The expected result is Negative. Fact Sheet for Patients:  PinkCheek.be Fact Sheet for Healthcare Providers:  GravelBags.it This test is not yet ap proved or cleared by the [place] FDA and  has been authorized for detection and/or diagnosis of SARS-CoV-2 by FDA under an Emergency Use Authorization (EUA). This EUA will remain  in effect (meaning this test can be used) for the duration of the COVID-19 declaration under Section 564(b)(1) of the Act, 21 U.S.C. section 360bbb-3(b)(1), unless the authorization is terminated or revoked sooner.    Influenza A by PCR NEGATIVE NEGATIVE Final   Influenza B by PCR NEGATIVE NEGATIVE Final    Comment: (NOTE) The Xpert Xpress SARS-CoV-2/FLU/RSV assay is intended as an aid in  the diagnosis of influenza from Nasopharyngeal swab specimens and  should not be used as a sole basis for treatment. Nasal washings and  aspirates are unacceptable for Xpert Xpress SARS-CoV-2/FLU/RSV  testing. Fact Sheet for Patients: PinkCheek.be Fact Sheet for Healthcare Providers: GravelBags.it This test is not yet approved or cleared by the [place] FDA and  has been authorized for detection and/or diagnosis of SARS-CoV-2 by  FDA under an Emergency Use Authorization (EUA). This EUA will remain  in effect (meaning this test can be used) for the duration of the  Covid-19 declaration under Section 564(b)(1) of the Act, 21  U.S.C. section 360bbb-3(b)(1), unless the authorization is  terminated or revoked. Performed at [facility], [address],  [place], [place] [address]   Blood culture (routine x 2)     Status: None   Collection Time: 05/28/19  7:10 PM   Specimen: BLOOD  Result Value Ref Range Status   Specimen Description BLOOD LEFT ANTECUBITAL  Final    Special Requests   Final    BOTTLES DRAWN AEROBIC AND ANAEROBIC Blood Culture adequate volume   Culture   Final    NO GROWTH 5 DAYS Performed at [facility], [address] [place], [place] [address]    Report Status 06/02/2019 FINAL  Final  Blood culture (routine x 2)     Status: None   Collection Time: 05/28/19  7:25 PM   Specimen: BLOOD  Result Value Ref Range Status   Specimen Description BLOOD BLOOD LEFT WRIST  Final   Special Requests   Final    BOTTLES DRAWN AEROBIC AND ANAEROBIC Blood Culture results may not be optimal due to an excessive volume of blood received in culture bottles   Culture   Final    NO GROWTH 5 DAYS Performed at [facility], [address] [place], [place] [address]    Report Status 06/02/2019 FINAL  Final  MRSA PCR Screening     Status: None   Collection Time: 06/01/19 10:58 AM   Specimen: Nasopharyngeal  Result Value Ref Range Status   MRSA by PCR NEGATIVE NEGATIVE Final    Comment:        The GeneXpert MRSA Assay (FDA approved for NASAL specimens only), is one component of a comprehensive MRSA colonization surveillance program. It is not intended to diagnose MRSA infection nor to guide or monitor treatment for MRSA infections. Performed at [facility], [address] [place],  [address]      Labs: CBC: Recent Labs  Lab 05/28/19 1621 05/29/19 0758 05/30/19 0503 05/31/19 0524 06/01/19 0828  WBC 8.3 6.1 6.7 7.4 9.2  NEUTROABS 5.7  --   --  5.0 6.7  HGB 13.0 11.1* 11.0* 11.1* 11.8*  HCT 38.6* 33.0* 32.8* 33.0* 34.7*  MCV 91.3 92.2 91.9 91.7 92.0  PLT 143* 114* 110* 114* 0000000*   Basic Metabolic Panel: Recent Labs  Lab 05/28/19 1621 05/29/19 0758 05/30/19 0503 05/31/19 0524 06/01/19 0828  NA 137 137 136 134* 133*  K 4.8 4.5 4.4 4.5 4.5  CL 102 106 104 103 99  CO2 22 21* 20* 20* 18*  GLUCOSE 120* 106* 115* 109* 127*  BUN 19 15 14 14 16   CREATININE 1.06 0.81 0.81 0.76 0.77  CALCIUM  9.4 8.6* 8.6* 8.7* 8.8*   Liver Function Tests: Recent Labs  Lab 05/28/19 1621 05/29/19 0758 05/30/19 0503 05/31/19 0524 06/01/19 0828  AST 201* 180* 211* 256* 349*  ALT 108* 89* 96* 107* 131*  ALKPHOS 300* 245* 286* 288* 299*  BILITOT 2.0* 1.8* 1.6* 2.4* 4.0*  PROT 7.6 6.3* 6.1* 6.3* 6.3*  ALBUMIN 3.5 2.8* 2.6* 2.6* 2.7*   Recent Labs  Lab 05/28/19 1644  LIPASE 30   No results for input(s): AMMONIA in the last 168 hours. Cardiac Enzymes: Recent Labs  Lab 06/01/19 1245  CKTOTAL 311   BNP (last 3 results) Recent Labs    05/28/19 1621  BNP 24.0   CBG: Recent Labs  Lab 06/01/19 1054  GLUCAP 159*    Time spent: 35 minutes  Signed:  Berle Mull  [facility] 06/02/2019 6:46 PM

## 2019-06-02 NOTE — Discharge Instructions (Signed)
Community-Acquired Pneumonia, Adult Pneumonia is an infection of the lungs. It causes swelling in the airways of the lungs. Mucus and fluid may also build up inside the airways. One type of pneumonia can happen while a person is in a hospital. A different type can happen when a person is not in a hospital (community-acquired pneumonia).  What are the causes?  This condition is caused by germs (viruses, bacteria, or fungi). Some types of germs can be passed from one person to another. This can happen when you breathe in droplets from the cough or sneeze of an infected person. What increases the risk? You are more likely to develop this condition if you:  Have a long-term (chronic) disease, such as: ? Chronic obstructive pulmonary disease (COPD). ? Asthma. ? Cystic fibrosis. ? Congestive heart failure. ? Diabetes. ? Kidney disease.  Have HIV.  Have sickle cell disease.  Have had your spleen removed.  Do not take good care of your teeth and mouth (poor dental hygiene).  Have a medical condition that increases the risk of breathing in droplets from your own mouth and nose.  Have a weakened body defense system (immune system).  Are a smoker.  Travel to areas where the germs that cause this illness are common.  Are around certain animals or the places they live. What are the signs or symptoms?  A dry cough.  A wet (productive) cough.  Fever.  Sweating.  Chest pain. This often happens when breathing deeply or coughing.  Fast breathing or trouble breathing.  Shortness of breath.  Shaking chills.  Feeling tired (fatigue).  Muscle aches. How is this treated? Treatment for this condition depends on many things. Most adults can be treated at home. In some cases, treatment must happen in a hospital. Treatment may include:  Medicines given by mouth or through an IV tube.  Being given extra oxygen.  Respiratory therapy. In rare cases, treatment for very bad pneumonia  may include:  Using a machine to help you breathe.  Having a procedure to remove fluid from around your lungs. Follow these instructions at home: Medicines  Take over-the-counter and prescription medicines only as told by your doctor. ? Only take cough medicine if you are losing sleep.  If you were prescribed an antibiotic medicine, take it as told by your doctor. Do not stop taking the antibiotic even if you start to feel better. General instructions   Sleep with your head and neck raised (elevated). You can do this by sleeping in a recliner or by putting a few pillows under your head.  Rest as needed. Get at least 8 hours of sleep each night.  Drink enough water to keep your pee (urine) pale yellow.  Eat a healthy diet that includes plenty of vegetables, fruits, whole grains, low-fat dairy products, and lean protein.  Do not use any products that contain nicotine or tobacco. These include cigarettes, e-cigarettes, and chewing tobacco. If you need help quitting, ask your doctor.  Keep all follow-up visits as told by your doctor. This is important. How is this prevented? A shot (vaccine) can help prevent pneumonia. Shots are often suggested for:  People older than 60 years of age.  People older than 60 years of age who: ? Are having cancer treatment. ? Have long-term (chronic) lung disease. ? Have problems with their body's defense system. You may also prevent pneumonia if you take these actions:  Get the flu (influenza) shot every year.  Go to the dentist as   often as told.  Wash your hands often. If you cannot use soap and water, use hand sanitizer. Contact a doctor if:  You have a fever.  You lose sleep because your cough medicine does not help. Get help right away if:  You are short of breath and it gets worse.  You have more chest pain.  Your sickness gets worse. This is very serious if: ? You are an older adult. ? Your body's defense system is weak.  You  cough up blood. Summary  Pneumonia is an infection of the lungs.  Most adults can be treated at home. Some will need treatment in a hospital.  Drink enough water to keep your pee pale yellow.  Get at least 8 hours of sleep each night. This information is not intended to replace advice given to you by your health care provider. Make sure you discuss any questions you have with your health care provider. Document Revised: 06/14/2018 Document Reviewed: 10/20/2017 Elsevier Patient Education  2020 Elsevier Inc.  

## 2019-06-02 NOTE — TOC Progression Note (Addendum)
Transition of Care [facility]) - Progression Note    Patient Details  Name: Matthew Yang MRN: [id number] Date of Birth: 1960/03/22  Transition of Care [facility]) CM/SW Contact  Marshell Garfinkel, RN Phone Number: 06/02/2019, 8:52 AM  Clinical Narrative:    RNCM met with patient and his wife at ICU bed 10. Offered hospice agency choice to both and they picked "[facility]".  They are requesting Hospital bed, walker, and bedside commode.  Message left for on-call with [facility]. MD to sign DNR for EMS. Update at 0917A: Callback from April with [facility]; they will not have a nurse available until [place]. She will work on having DME delivered to home over the weekend.   Update at 0932: Wife is okay with discharge today and not having nurse until [place] as long as she has pain medication and DME. MD/RN updated. Hospice nurse is working on DME delivery for today. Update at 1000: DME will be delivered after 1230P today per April with hospice- team updated; patient/wife updated. EMS packet complete. Update: Per Wife DME has been delivered and outpatient pharmacy was able to fill all medications. ICU RN said she would like to call EMS when she is ready. No other RNCM needs.     Expected Discharge Plan: Home w Hospice Care Barriers to Discharge: Equipment Delay  Expected Discharge Plan and Services Expected Discharge Plan: [place]   Discharge Planning Services: CM Consult Post Acute Care Choice: Hospice Living arrangements for the past 2 months: Single Family Home                 DME Arranged: 3-N-1, Walker rolling, Oxygen, Hospital bed   Date DME Agency Contacted: 06/02/19 Time DME Agency Contacted: [phone] Representative spoke with at [place]: [facility] on-call             Social Determinants of Health ([place]) Interventions    Readmission Risk Interventions No flowsheet data found.

## 2019-06-02 NOTE — Progress Notes (Signed)
Patient transferred by EMS to home.

## 2019-06-02 NOTE — Progress Notes (Signed)
EMS called for transport- patient and wife aware.

## 2019-06-02 NOTE — Progress Notes (Signed)
Patient discharged home with hospice.

## 2019-06-04 ENCOUNTER — Other Ambulatory Visit: Payer: Self-pay | Admitting: *Deleted

## 2019-06-04 NOTE — Patient Outreach (Addendum)
[facility]) Care Management  06/04/2019  Matthew Yang 01-27-1961 [id number]   Transition of care /Case Closure   Referral received:05/29/19 Initial outreach: 06/04/19 Insurance: [employer]     Objective: Per the electronic medical record, Mr.Fait was discharged home from [facility] on 06/02/19 with Hospice at home  Services with  [facility] care.    Plan Placed call to [facility] [place] records, representative ,confirmed patient has been enrolled with [facility] at home.  Will close to [facility] care management services.     Joylene Draft, RN, BSN  [place] Management Coordinator  [phone]- Mobile [phone]- Toll Free Main Office

## 2019-06-05 ENCOUNTER — Telehealth: Payer: Self-pay | Admitting: Internal Medicine

## 2019-06-05 NOTE — Telephone Encounter (Signed)
I called patient's wife and offered my condolences.  Family very thankful for the call.  GB

## 2019-06-07 DEATH — deceased

## 2019-06-19 ENCOUNTER — Telehealth: Payer: Self-pay | Admitting: *Deleted

## 2019-06-19 NOTE — Telephone Encounter (Signed)
-----   Message from Clancy Gourd sent at 06/19/2019 11:08 AM EDT ----- Regarding: [place],  I just uploaded an [place] into this pt's chart in Epic under the Media tab. Thanks.

## 2019-06-19 NOTE — Telephone Encounter (Signed)
Reviewed scanned in form. Form requesting team to resubmit demographic sheets on hospital claim form. This portion of the form was not given to our clinic and was faxed to [facility]. I personally reached out to patient's wife. She has that section of the forms. She has already personally faxed the entire form to [employer] herself yesterday. No other intervention at this time is needed.

## 2019-09-16 IMAGING — CT CT CHEST W/ CM
1 series · 1 of 1 positions shown · IV contrast (omnipaque)
Comparison: CT of the chest abdomen pelvis dated 08/09/2018

CLINICAL DATA: 58-year-old male with right lower quadrant abdominal
pain. History of esophageal cancer being treated with radiation and
chemotherapy.

EXAM:
CT CHEST, ABDOMEN, AND PELVIS WITH CONTRAST
TECHNIQUE: Multidetector CT imaging of the chest, abdomen and pelvis was
performed following the standard protocol during bolus
administration of intravenous contrast.
CONTRAST:  100mL OMNIPAQUE IOHEXOL 300 MG/ML  SOLN

[Series 1: topogram · coronal · 1.50mm/px · 1 of 1 slices shown]
[im 1/1]
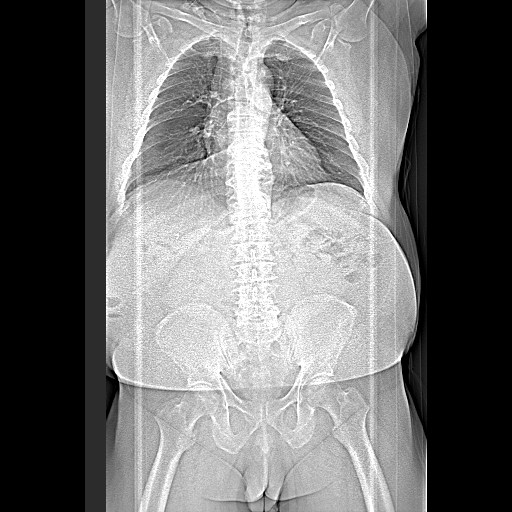

[1 of 1 positions shown; findings below may reference images not displayed]

FINDINGS: CT CHEST FINDINGS

Cardiovascular: There is no cardiomegaly or pericardial effusion.
Multi vessel coronary vascular calcification and calcification of
the mitral annulus. Mild atherosclerotic calcification of the
thoracic aorta. The central pulmonary arteries appear unremarkable
the degree of opacification. Right-sided Port-A-Cath with tip at the
cavoatrial junction.

Mediastinum/Nodes: There is no hilar or mediastinal adenopathy.
There is a small hiatal hernia. Heterogeneous content in the distal
esophagus, likely ingested matter. Evaluation of the esophagus and
gastroesophageal junction and stomach is limited in the absence of
oral contrast. However, the previously seen masslike thickening of
the gastroesophageal junction is not appreciated on today's scan.
The thyroid gland is grossly unremarkable. No mediastinal fluid
collection.

Lungs/Pleura: The lungs are clear. There is no pleural effusion
pneumothorax. The central airways are patent.

Musculoskeletal: There is degenerative changes of the spine. No
acute osseous pathology. No suspicious bone lesions.

CT ABDOMEN PELVIS FINDINGS

No intra-abdominal free air or free fluid.

Hepatobiliary: Cirrhosis. No intrahepatic biliary ductal dilatation.
Cholecystectomy. No retained calcified stone noted in the central
CBD.

Pancreas: Unremarkable. No pancreatic ductal dilatation or
surrounding inflammatory changes.

Spleen: Normal in size without focal abnormality.

Adrenals/Urinary Tract: The adrenal glands are unremarkable. There
is a 2 mm stone along the posterior wall of the urinary bladder
adjacent to the right ureterovesical junction which may represent a
recently passed right renal calculus versus a right UVJ stone. There
is mild right hydronephrosis. There is a 2 mm nonobstructing left
renal upper pole calculus. There is no hydronephrosis on the left.
The urinary bladder is predominantly collapsed.

Stomach/Bowel: Evaluation of the bowel is limited in the absence of
oral contrast. Mild haziness of the fat surrounding the
gastroesophageal junction. There is no bowel obstruction or active
inflammation. The appendix is normal.

Vascular/Lymphatic: The abdominal aorta and IVC are unremarkable. No
portal venous gas. Mildly enlarged gastrohepatic space lymph node
measures approximately 12 mm. There is slight haziness of the fat
surrounding this lymph node. Overall significant interval
improvement in the previously seen lymphadenopathy in the
gastrohepatic space. No new adenopathy.

Reproductive: The prostate and seminal vesicles are grossly
unremarkable. No pelvic mass.

Other: None

Musculoskeletal: Degenerative changes of the spine. No acute osseous
pathology. No suspicious osseous lesions.
IMPRESSION: 1. A 2 mm recently passed right renal calculus versus a right UVJ
stone with mild right hydronephrosis.
2. A 2 mm nonobstructing left renal upper pole calculus. No
hydronephrosis.
3. Significant interval improvement of the previously seen masslike
thickening of the gastroesophageal junction and adenopathy seen in
the gastrohepatic space on the prior CT. No evidence of metastatic
disease.
4. Cirrhosis.
5. No bowel obstruction or active inflammation. Normal appendix.
6. Aortic Atherosclerosis ([id number]).

## 2019-11-07 ENCOUNTER — Ambulatory Visit: Payer: Self-pay | Admitting: Urology

## 2019-12-16 IMAGING — CT CT ABD-PELV W/ CM
3 of 8 series · 10 of 46 positions shown, 16 images · IV contrast (omnipaque)
Comparison: PET-CT 11/27/2018.  CT scan from 11/20/2018.

CLINICAL DATA: Esophageal cancer.

EXAM:
CT CHEST, ABDOMEN, AND PELVIS WITH CONTRAST
TECHNIQUE: Multidetector CT imaging of the chest, abdomen and pelvis was
performed following the standard protocol during bolus
administration of intravenous contrast.
CONTRAST:  100mL OMNIPAQUE IOHEXOL 300 MG/ML  SOLN

[Series 2: axials cap 5.00 · axial · 0.95mm/px · z∈[-1476,-1361]mm · 2 of 140 slices shown (1 of 2)]
[im 24/140  soft-tissue]
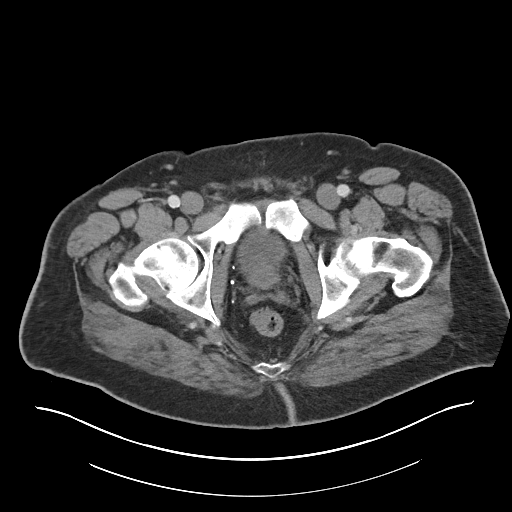
[im 47/140  soft-tissue]
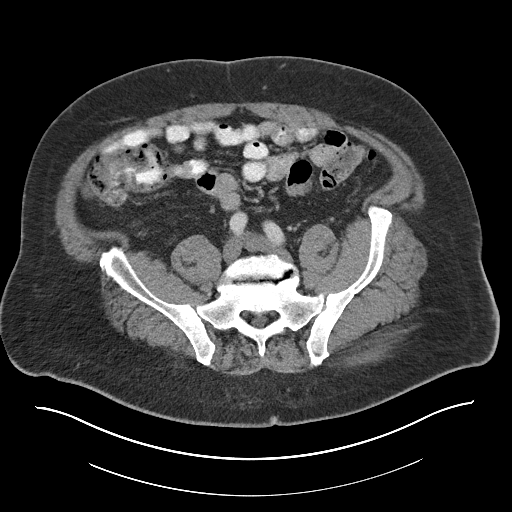

[Series 2: axials cap 5.00 · axial · 0.95mm/px · z∈[-1476,-1016]mm · 5 of 140 slices shown, 10 images (2 of 2)]
[im 24/140  soft-tissue]
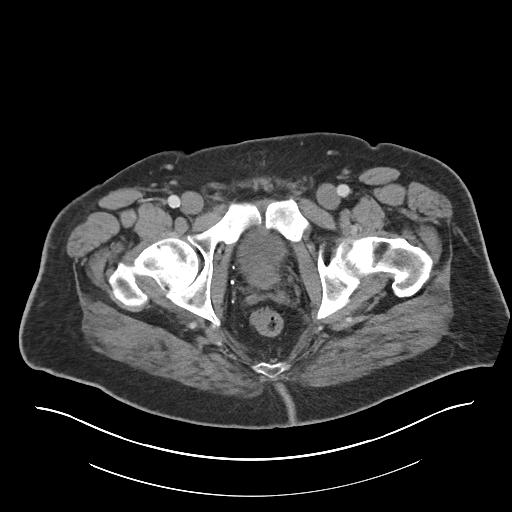
[im 24/140  bone]
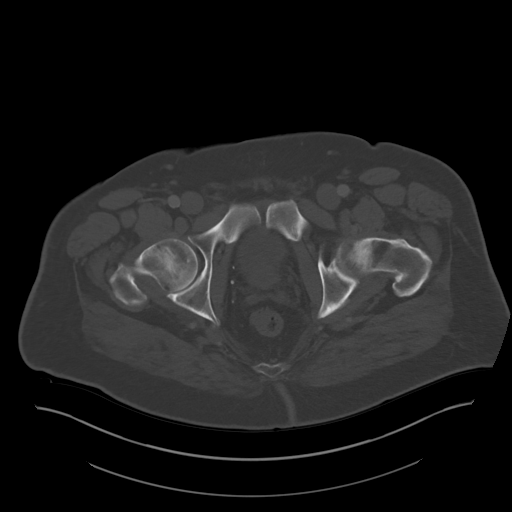
[im 47/140  soft-tissue]
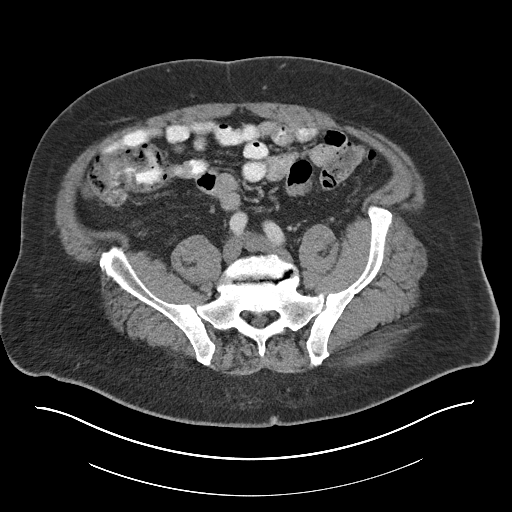
[im 47/140  lung]
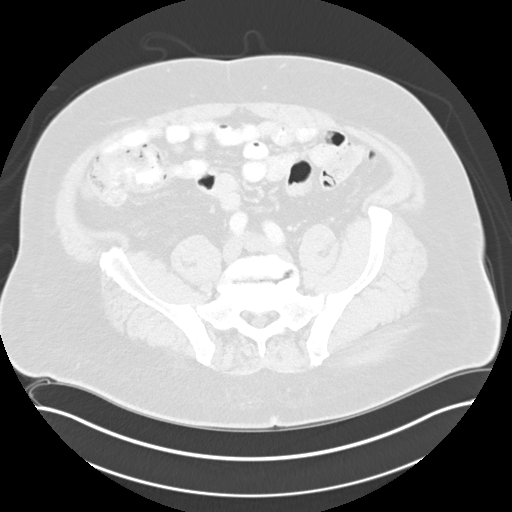
[im 70/140  soft-tissue]
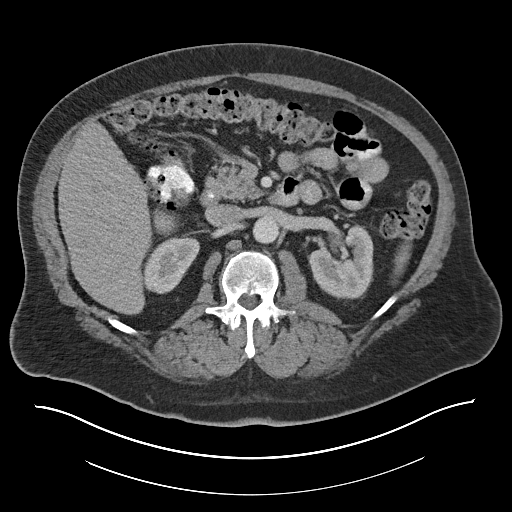
[im 70/140  lung]
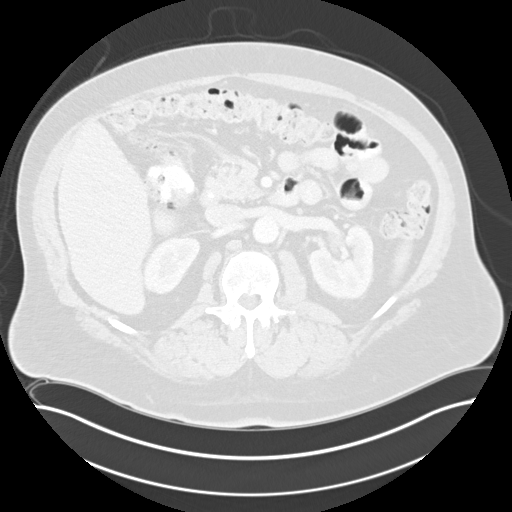
[im 93/140  soft-tissue]
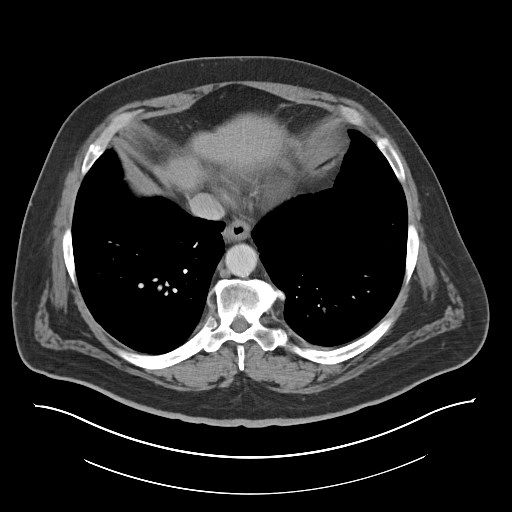
[im 93/140  lung]
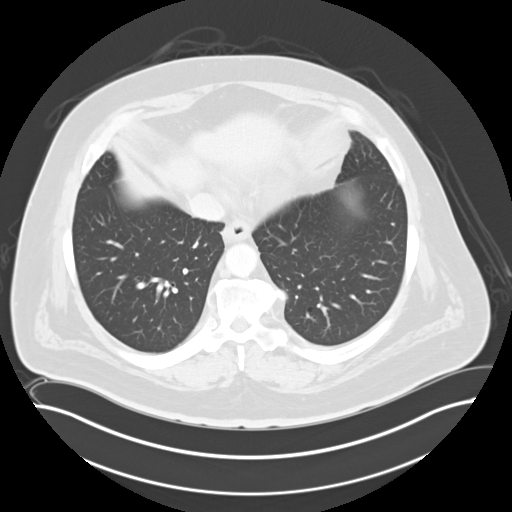
[im 116/140  soft-tissue]
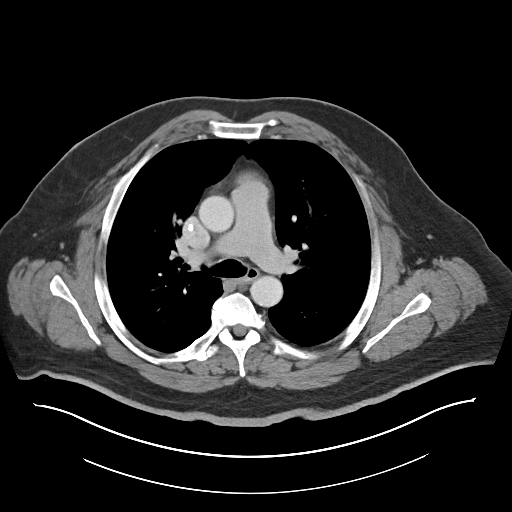
[im 116/140  lung]
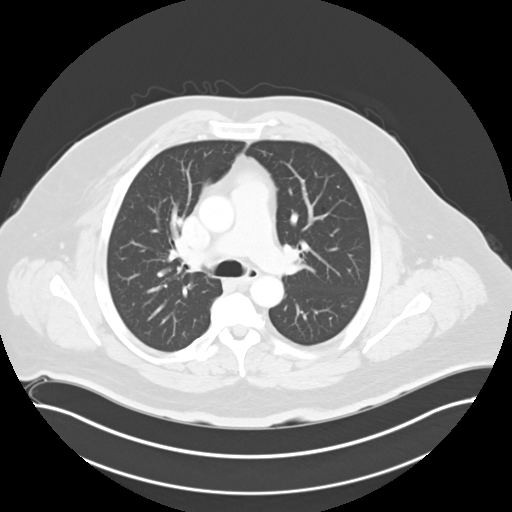

[Series 4: coronals cap 2.00 cor · coronal · 0.95mm/px · 3 of 184 slices shown, 4 images]
[im 46/184  soft-tissue]
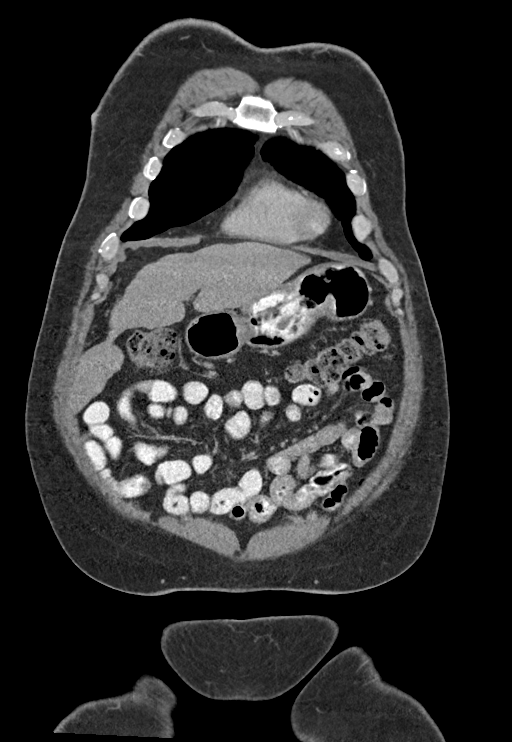
[im 92/184  soft-tissue]
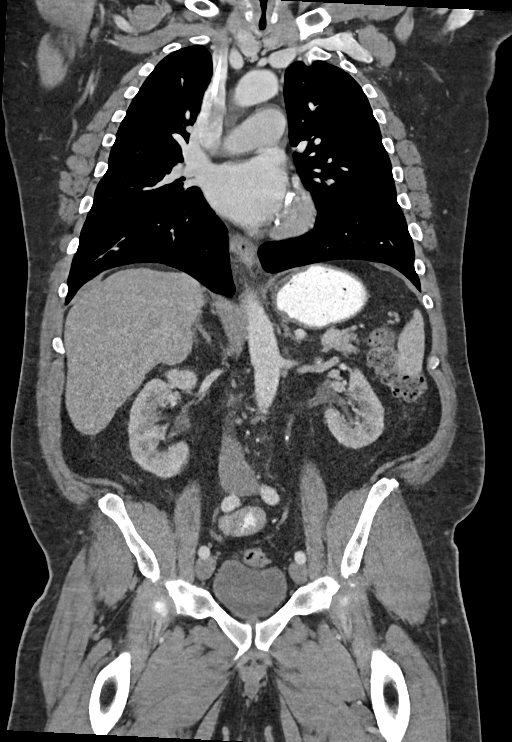
[im 92/184  bone]
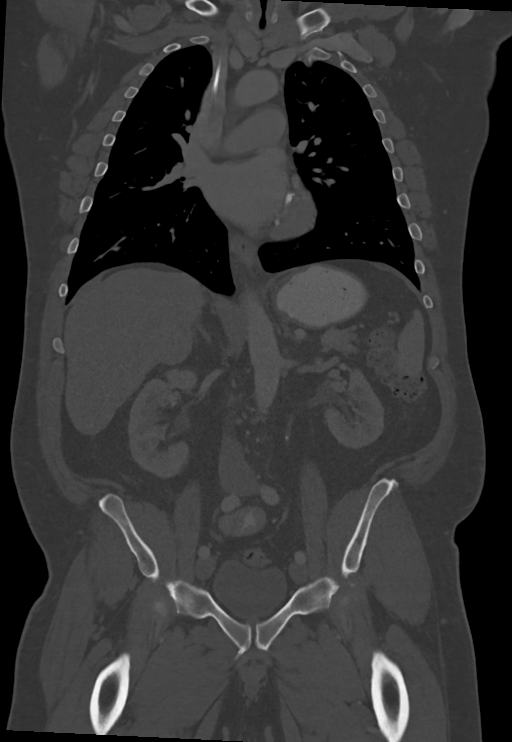
[im 138/184  soft-tissue]
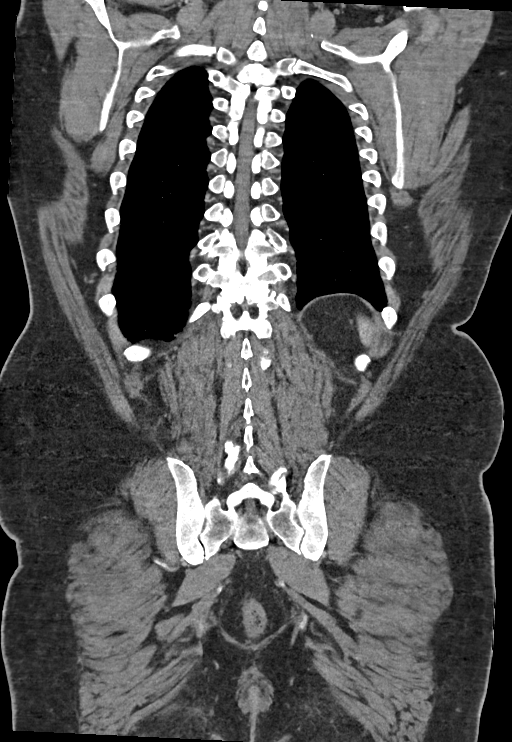

[10 of 46 positions shown; findings below may reference images not displayed]

FINDINGS: CT CHEST FINDINGS

Cardiovascular: The heart size is normal. No substantial pericardial
effusion. Coronary artery calcification is evident. Atherosclerotic
calcification is noted in the wall of the thoracic aorta. Right
Port-A-Cath tip is positioned in the distal SVC.

Mediastinum/Nodes: No mediastinal lymphadenopathy. There is no hilar
lymphadenopathy. The esophagus has normal imaging features. There is
no axillary lymphadenopathy.

Lungs/Pleura: No suspicious pulmonary nodule or mass. Search no
consolidation No pleural effusion.

Musculoskeletal: No worrisome lytic or sclerotic osseous
abnormality.

CT ABDOMEN PELVIS FINDINGS

Hepatobiliary: Nodular liver contour is compatible with cirrhosis.
Gallbladder is surgically absent. No intrahepatic or extrahepatic
biliary dilation.

Pancreas: No focal mass lesion. No dilatation of the main duct. No
intraparenchymal cyst. No peripancreatic edema.

Spleen: No splenomegaly. No focal mass lesion.

Adrenals/Urinary Tract: No adrenal nodule or mass. No evidence for
hydroureter. The urinary bladder appears normal for the degree of
distention.

Stomach/Bowel: Subtle haziness noted around the distal esophagus and
esophagogastric junction without mass lesion evident by CT. Stomach
otherwise unremarkable. Duodenum is normally positioned as is the
ligament of [place]. No small bowel wall thickening. No small bowel
dilatation. The terminal ileum is normal. The appendix is normal. No
gross colonic mass. No colonic wall thickening.

Vascular/Lymphatic: There is abdominal aortic atherosclerosis
without aneurysm. No upper abdominal lymphadenopathy. Index
gastrohepatic ligament lymph node has short axis measurement of 7 mm
today compared to 10 mm on PET-CT of 11/27/2018. No pelvic sidewall
lymphadenopathy.

Reproductive: The prostate gland and seminal vesicles are
unremarkable.

Other: No intraperitoneal free fluid.

Musculoskeletal: No worrisome lytic or sclerotic osseous
abnormality.
IMPRESSION: 1. Stable exam. No new or progressive interval findings since PET-CT
11/27/2018.
2. No obvious distal esophageal mass on today's study with subtle
haziness around the distal esophagus and esophagogastric junction,
stable. Index gastrohepatic ligament lymph node measured on prior
study has decreased slightly in the interval.
3. Liver contour compatible with cirrhosis.
4.  Aortic Atherosclerois ([id number]-170.0)
